# Patient Record
Sex: Female | Born: 1935
Health system: Southern US, Community
[De-identification: ages and names within clinical notes are randomized; demographics above are authoritative.]

## PROBLEM LIST (undated history)

## (undated) DIAGNOSIS — K5790 Diverticulosis of intestine, part unspecified, without perforation or abscess without bleeding: Secondary | ICD-10-CM

## (undated) DIAGNOSIS — C50919 Malignant neoplasm of unspecified site of unspecified female breast: Secondary | ICD-10-CM

## (undated) DIAGNOSIS — E039 Hypothyroidism, unspecified: Secondary | ICD-10-CM

## (undated) DIAGNOSIS — R0989 Other specified symptoms and signs involving the circulatory and respiratory systems: Secondary | ICD-10-CM

## (undated) DIAGNOSIS — E559 Vitamin D deficiency, unspecified: Secondary | ICD-10-CM

## (undated) DIAGNOSIS — R7303 Prediabetes: Secondary | ICD-10-CM

## (undated) DIAGNOSIS — I700 Atherosclerosis of aorta: Secondary | ICD-10-CM

## (undated) HISTORY — DX: Prediabetes: R73.03

## (undated) HISTORY — PX: BREAST SURGERY: SHX581

## (undated) HISTORY — DX: Atherosclerosis of aorta: I70.0

## (undated) HISTORY — DX: Vitamin D deficiency, unspecified: E55.9

## (undated) HISTORY — DX: Diverticulosis of intestine, part unspecified, without perforation or abscess without bleeding: K57.90

## (undated) HISTORY — DX: Other specified symptoms and signs involving the circulatory and respiratory systems: R09.89

## (undated) HISTORY — PX: COLONOSCOPY: SHX174

---

## 1998-11-01 ENCOUNTER — Other Ambulatory Visit: Admission: RE | Admit: 1998-11-01 | Discharge: 1998-11-01 | Payer: Self-pay | Admitting: Obstetrics and Gynecology

## 1999-10-05 ENCOUNTER — Encounter: Payer: Self-pay | Admitting: Surgery

## 1999-10-05 ENCOUNTER — Ambulatory Visit (HOSPITAL_COMMUNITY): Admission: RE | Admit: 1999-10-05 | Discharge: 1999-10-05 | Payer: Self-pay | Admitting: Internal Medicine

## 1999-10-16 ENCOUNTER — Encounter: Payer: Self-pay | Admitting: Surgery

## 1999-10-16 ENCOUNTER — Ambulatory Visit (HOSPITAL_COMMUNITY): Admission: RE | Admit: 1999-10-16 | Discharge: 1999-10-16 | Payer: Self-pay | Admitting: Surgery

## 2001-04-22 ENCOUNTER — Other Ambulatory Visit: Admission: RE | Admit: 2001-04-22 | Discharge: 2001-04-22 | Payer: Self-pay | Admitting: Obstetrics and Gynecology

## 2002-11-03 ENCOUNTER — Other Ambulatory Visit: Admission: RE | Admit: 2002-11-03 | Discharge: 2002-11-03 | Payer: Self-pay | Admitting: Obstetrics and Gynecology

## 2005-02-06 ENCOUNTER — Other Ambulatory Visit: Admission: RE | Admit: 2005-02-06 | Discharge: 2005-02-06 | Payer: Self-pay | Admitting: Internal Medicine

## 2010-07-06 ENCOUNTER — Other Ambulatory Visit: Payer: Self-pay | Admitting: Internal Medicine

## 2010-07-06 ENCOUNTER — Other Ambulatory Visit (HOSPITAL_COMMUNITY)
Admission: RE | Admit: 2010-07-06 | Discharge: 2010-07-06 | Disposition: A | Discharge status: Home / Self Care | Payer: Medicare Other | Source: Ambulatory Visit | Attending: Internal Medicine | Admitting: Internal Medicine

## 2010-07-06 DIAGNOSIS — Z124 Encounter for screening for malignant neoplasm of cervix: Secondary | ICD-10-CM | POA: Insufficient documentation

## 2010-07-06 DIAGNOSIS — Z1159 Encounter for screening for other viral diseases: Secondary | ICD-10-CM | POA: Insufficient documentation

## 2011-06-19 ENCOUNTER — Other Ambulatory Visit: Payer: Self-pay | Admitting: Radiology

## 2011-06-20 ENCOUNTER — Other Ambulatory Visit: Payer: Self-pay | Admitting: Radiology

## 2011-06-20 DIAGNOSIS — C50911 Malignant neoplasm of unspecified site of right female breast: Secondary | ICD-10-CM

## 2011-06-21 ENCOUNTER — Other Ambulatory Visit: Payer: Self-pay | Admitting: *Deleted

## 2011-06-21 ENCOUNTER — Telehealth: Payer: Self-pay | Admitting: *Deleted

## 2011-06-21 DIAGNOSIS — C50511 Malignant neoplasm of lower-outer quadrant of right female breast: Secondary | ICD-10-CM | POA: Insufficient documentation

## 2011-06-21 DIAGNOSIS — C50519 Malignant neoplasm of lower-outer quadrant of unspecified female breast: Secondary | ICD-10-CM

## 2011-06-21 NOTE — Telephone Encounter (Signed)
Confirmed [facility] for 06/27/11 at 0800.  Instructions and contact information given.

## 2011-06-25 ENCOUNTER — Ambulatory Visit
Admission: RE | Admit: 2011-06-25 | Discharge: 2011-06-25 | Disposition: A | Discharge status: Home / Self Care | Payer: Medicare Other | Source: Ambulatory Visit | Attending: Radiology | Admitting: Radiology

## 2011-06-25 DIAGNOSIS — C50911 Malignant neoplasm of unspecified site of right female breast: Secondary | ICD-10-CM

## 2011-06-25 MED ORDER — GADOBENATE DIMEGLUMINE 529 MG/ML IV SOLN
12.0000 mL | Freq: Once | INTRAVENOUS | Status: AC | PRN
  Administered 2011-06-25: 12 mL via INTRAVENOUS

## 2011-06-27 ENCOUNTER — Ambulatory Visit
Admission: RE | Admit: 2011-06-27 | Discharge: 2011-06-27 | Disposition: A | Discharge status: Home / Self Care | Payer: Medicare Other | Source: Ambulatory Visit | Attending: Radiation Oncology | Admitting: Radiation Oncology

## 2011-06-27 ENCOUNTER — Telehealth: Payer: Self-pay | Admitting: Oncology

## 2011-06-27 ENCOUNTER — Encounter: Payer: Self-pay | Admitting: Radiation Oncology

## 2011-06-27 ENCOUNTER — Ambulatory Visit (HOSPITAL_BASED_OUTPATIENT_CLINIC_OR_DEPARTMENT_OTHER): Payer: Medicare Other | Admitting: Oncology

## 2011-06-27 ENCOUNTER — Encounter (INDEPENDENT_AMBULATORY_CARE_PROVIDER_SITE_OTHER): Payer: Self-pay | Admitting: Surgery

## 2011-06-27 ENCOUNTER — Ambulatory Visit: Payer: Medicare Other | Attending: Surgery | Admitting: Physical Therapy

## 2011-06-27 ENCOUNTER — Ambulatory Visit: Payer: Medicare Other

## 2011-06-27 ENCOUNTER — Other Ambulatory Visit (HOSPITAL_BASED_OUTPATIENT_CLINIC_OR_DEPARTMENT_OTHER): Payer: Medicare Other | Admitting: Lab

## 2011-06-27 ENCOUNTER — Ambulatory Visit (HOSPITAL_BASED_OUTPATIENT_CLINIC_OR_DEPARTMENT_OTHER): Payer: Medicare Other | Admitting: Surgery

## 2011-06-27 ENCOUNTER — Encounter: Payer: Self-pay | Admitting: Oncology

## 2011-06-27 VITALS — BP 157/72 | HR 58 | Temp 97.7°F | Ht 60.0 in | Wt 138.7 lb

## 2011-06-27 VITALS — BP 120/80 | HR 80 | Temp 98.0°F | Resp 20

## 2011-06-27 DIAGNOSIS — C50519 Malignant neoplasm of lower-outer quadrant of unspecified female breast: Secondary | ICD-10-CM

## 2011-06-27 DIAGNOSIS — D0510 Intraductal carcinoma in situ of unspecified breast: Secondary | ICD-10-CM

## 2011-06-27 DIAGNOSIS — D0590 Unspecified type of carcinoma in situ of unspecified breast: Secondary | ICD-10-CM

## 2011-06-27 DIAGNOSIS — R293 Abnormal posture: Secondary | ICD-10-CM | POA: Insufficient documentation

## 2011-06-27 DIAGNOSIS — IMO0001 Reserved for inherently not codable concepts without codable children: Secondary | ICD-10-CM | POA: Insufficient documentation

## 2011-06-27 DIAGNOSIS — C50919 Malignant neoplasm of unspecified site of unspecified female breast: Secondary | ICD-10-CM | POA: Insufficient documentation

## 2011-06-27 LAB — CBC WITH DIFFERENTIAL/PLATELET
BASO%: 0.5 % (ref 0.0–2.0)
Basophils Absolute: 0 10*3/uL (ref 0.0–0.1)
EOS%: 1.6 % (ref 0.0–7.0)
Eosinophils Absolute: 0.1 10*3/uL (ref 0.0–0.5)
HCT: 38.9 % (ref 34.8–46.6)
HGB: 12.7 g/dL (ref 11.6–15.9)
LYMPH%: 35.9 % (ref 14.0–49.7)
MCH: 28.4 pg (ref 25.1–34.0)
MCHC: 32.6 g/dL (ref 31.5–36.0)
MCV: 87 fL (ref 79.5–101.0)
MONO#: 0.8 10*3/uL (ref 0.1–0.9)
MONO%: 16.3 % — ABNORMAL HIGH (ref 0.0–14.0)
NEUT#: 2.3 10*3/uL (ref 1.5–6.5)
NEUT%: 45.7 % (ref 38.4–76.8)
Platelets: 144 10*3/uL — ABNORMAL LOW (ref 145–400)
RBC: 4.47 10*6/uL (ref 3.70–5.45)
RDW: 14.4 % (ref 11.2–14.5)
WBC: 5 10*3/uL (ref 3.9–10.3)
lymph#: 1.8 10*3/uL (ref 0.9–3.3)

## 2011-06-27 LAB — COMPREHENSIVE METABOLIC PANEL
AST: 27 U/L (ref 0–37)
Albumin: 3.4 g/dL — ABNORMAL LOW (ref 3.5–5.2)
BUN: 15 mg/dL (ref 6–23)
CO2: 28 mEq/L (ref 19–32)
Calcium: 9.9 mg/dL (ref 8.4–10.5)
Chloride: 99 mEq/L (ref 96–112)
Glucose, Bld: 109 mg/dL — ABNORMAL HIGH (ref 70–99)
Potassium: 4.1 mEq/L (ref 3.5–5.3)

## 2011-06-27 NOTE — Progress Notes (Signed)
Robin Huynh [id number] [birth year] 77 y.o. 06/27/2011 12:30 PM  CC  MCKEOWN,WILLIAM DAVID, MD [address] [place] [address] Dr. Abigail Miyamoto Dr. 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 Patient was seen in the Multidisciplinary Breast Clinic for discussion of her treatment options.  STAGE:   Cancer of lower-outer quadrant of female breast   Primary site: Breast (Right)   Staging method: AJCC 7th Edition   Clinical: (Tis, N0, cM0)   Summary: (Tis, N0, cM0)  REFERRING PHYSICIAN: Dr. Abigail Miyamoto  HISTORY OF PRESENT ILLNESS:  Robin Huynh is a 77 y.o. female With medical history significant foro thyroidism and possibly osteopenia. Patient has been going for screening mammograms on a regular basis. About 6 months ago patient had a screening mammogram performed that showed some increasing calcifications. Because of this additional imaging of the right breast were requested. As there were noted to be waxing and waning subcentimeter masses consistent possibly with cysts in the right breast. On 06/14/2011 patient Had a unilateral diagnostic mammogram performed of the right breast. She was again noted to have calcifications in regional distribution in the lower outer quadrant of the right breast which had not significantly changed there were located in the same area same size on compression magnification views there were a few more calcifications on this mammogram in comparison to 6 months ago there were no associated masses or distortion. Patient was recommended either bilateral diagnostic mammograms and six-month or proceed with stereotactic guided core needle biopsy. Patient opted for a core needle biopsy. She had this performed on 06/19/2011. The pathology showed a ductal carcinoma in situ with associated microcalcifications  intermediate grade ER +100% PR +76%. Patient is now seen in the multidisciplinary breast clinic for discussion of treatment options. Patient on 06/25/2011 also had bilateral breast MRIs performed. The MRI showed small hematoma and clip artifact in the lower outer quadrant of the right breast from her recent core biopsy no other abnormal enhancement associated with the patient's known DCIS. There was an oval 4 mm in size enhancing mass located within the lower inner quadrant of the right breast that contained fatty cleft. In addition there were some stable benign intramammary lymph nodes. However because of this additional finding a 4 mm it was recommended at the multidisciplinary breast conference the patient undergo a second biopsy. Otherwise patient is without any other significant complaints.   Past Medical History: Past Medical History  Diagnosis Date  . Thyroid disease     Past Surgical History: Past Surgical History  Procedure Date  . Breast surgery     Family History: Family History  Problem Relation Age of Onset  . Cancer Mother   . Cancer Father   . Cancer Brother     Social History Patient is married she is accompanied by her husband today. She has 2 children age 75 and 80 patient is a retired [employer] loss adjuster. History  Substance Use Topics  . Smoking status: Never Smoker   . Smokeless tobacco: Not on file  . Alcohol Use: Not on file    Allergies: No Known Allergies  Current Medications: Current Outpatient Prescriptions  Medication Sig Dispense Refill  . aspirin 81 MG tablet Take 81 mg by mouth daily.      . calcium carbonate (TUMS - DOSED IN MG ELEMENTAL CALCIUM) 500 MG chewable tablet Chew 1 tablet by mouth daily.      Marland Kitchen  fish oil-omega-3 fatty acids 1000 MG capsule Take 2 g by mouth daily.      Marland Kitchen levothyroxine (SYNTHROID, LEVOTHROID) 100 MCG tablet Take 100 mcg by mouth daily.      . magnesium 30 MG tablet Take 30 mg by mouth 2 (two) times daily.      . raloxifene  (EVISTA) 60 MG tablet Take 60 mg by mouth daily.      . Red Yeast Rice 600 MG CAPS Take by mouth.        OB/GYN History: Menarche at age 18 patient is postmenopausal no hormone replacement therapy she had her first live birth at 26 she has completed all of her family.  Fertility Discussion: N/A Prior History of Cancer: N/A  Health Maintenance:  Colonoscopy Patient had a colonoscopy in 2008 Bone Density Last bone density was over 2 years ago Last PAP smear 2012  ECOG PERFORMANCE STATUS: 1 - Symptomatic but completely ambulatory  Genetic Counseling/testing: Patient's family history is reviewed and at this time genetic counseling and testing is not recommended.  REVIEW OF SYSTEMS:  Constitutional: positive for fatigue Ears, nose, mouth, throat, and face: negative Respiratory: negative Cardiovascular: negative Gastrointestinal: negative Genitourinary:negative Integument/breast: positive for breast tenderness Hematologic/lymphatic: negative Musculoskeletal:negative Neurological: negative Endocrine: negative Allergic/Immunologic: negative  PHYSICAL EXAMINATION: Blood pressure 157/72, pulse 58, temperature 97.7 F (36.5 C), height 5' (1.524 m), weight 138 lb 11.2 oz (62.914 kg).  ZOX:WRUEA, healthy and no distress SKIN: no rashes or significant lesions HEAD: Normocephalic EYES: PERRLA, EOMI, Conjunctiva are pink and non-injected, sclera clear EARS: External ears normal OROPHARYNX:no exudate, no erythema and dentition normal  NECK: supple, no adenopathy, no bruits, no JVD LYMPH:  no palpable lymphadenopathy, no hepatosplenomegaly BREAST:left breast normal without mass, skin or nipple changes or axillary nodes, Right breast shows an area of ecchymosis from her recent biopsy there is a slight hematoma palpable otherwise no nipple discharge or any other skin changes. LUNGS: clear to auscultation and percussion HEART: regular rate & rhythm, no murmurs and no  gallops ABDOMEN:abdomen soft, non-tender, normal bowel sounds and no masses or organomegaly BACK: No CVA tenderness EXTREMITIES:no edema, no clubbing, no cyanosis  NEURO: alert & oriented x 3 with fluent speech, no focal motor/sensory deficits, gait normal, reflexes normal and symmetric     STUDIES/RESULTS: Mr Breast Bilateral W Wo Contrast  06/26/2011  *RADIOLOGY REPORT*  Clinical Data: Recently diagnosed right breast DCIS (via stereotactic core biopsy of calcifications located within the lower outer quadrant of the right breast).  Presurgical evaluation.  BUN and creatinine were obtained on site at [facility] at [address] Results:  BUN 0.8 mg/dL,  Creatinine 13 mg/dL.  BILATERAL BREAST MRI WITH AND WITHOUT CONTRAST  Technique: Multiplanar, multisequence MR images of both breasts were obtained prior to and following the intravenous administration of 12ml of Multihance.  Three dimensional images were evaluated at the independent DynaCad workstation.  Comparison:  Previous studies from [facility] dating back to 11/03/2008.  Findings: There is post biopsy change with a small hematoma and a clip artifact located within the lower outer quadrant of the right breast related to the patient's recent stereotactic core biopsy. There is no abnormal enhancement associated with the patient's known DCIS.  There is an oval 4 mm in size enhancing mass located within the lower inner quadrant of the right breast which contains a fatty cleft on the axial T2 sequences.  In addition, this appears stable when compared to prior mammograms dating back to 11/03/2008  and is consistent with a stable benign intramammary lymph node. There are no areas of worrisome enhancement within either breast. There is no evidence for axillary or internal mammary adenopathy and there are no additional findings.  IMPRESSION: Mild post biopsy change within the lower outer quadrant of the right breast.  No areas of worrisome  enhancement within either breast and no evidence for adenopathy.  RECOMMENDATION: BI-RADS CATEGORY 6:  Known biopsy-proven malignancy - appropriate action should be taken.  THREE-DIMENSIONAL MR IMAGE RENDERING ON INDEPENDENT WORKSTATION:  Three-dimensional MR images were rendered by post-processing of the original MR data on an independent workstation.  The three- dimensional MR images were interpreted, and findings were reported in the accompanying complete MRI report for this study.  Original Report Authenticated By: Rolla Plate, M.D.     LABS:    Chemistry      Component Value Date/Time   NA 136 06/27/2011 0800   K 4.1 06/27/2011 0800   CL 99 06/27/2011 0800   CO2 28 06/27/2011 0800   BUN 15 06/27/2011 0800   CREATININE 0.86 06/27/2011 0800      Component Value Date/Time   CALCIUM 9.9 06/27/2011 0800   ALKPHOS 66 06/27/2011 0800   AST 27 06/27/2011 0800   ALT 8 06/27/2011 0800   BILITOT 0.2* 06/27/2011 0800      Lab Results  Component Value Date   WBC 5.0 06/27/2011   HGB 12.7 06/27/2011   HCT 38.9 06/27/2011   MCV 87.0 06/27/2011   PLT 144* 06/27/2011       PATHOLOGY: ADDITIONAL INFORMATION: PROGNOSTIC INDICATORS - ACIS Results IMMUNOHISTOCHEMICAL AND MORPHOMETRIC ANALYSIS BY THE AUTOMATED CELLULAR IMAGING SYSTEM (ACIS) Estrogen Receptor (Negative, <1%): 100%, POSITIVE,,MODERATE STAINING INTENSITY Progesterone Receptor (Negative, <1%): 76%,POSITIVE, MODERATE STAINING INTENSITY All controls stained appropriately Abigail Miyamoto MD Pathologist, Electronic Signature ( Signed 06/22/2011) FINAL DIAGNOSIS Diagnosis Breast, right, needle core biopsy - DUCTAL CARCINOMA IN SITU WITH ASSOCIATED MICROCALCIFICATION. - SEE COMMENT. Microscopic Comment Although definitive grading of ductal carcinoma in situ is best done on excision, the features of the ductal carcinoma in situ from the right needle core biopsy are consistent with intermediate grade ductal carcinoma in situ. An ER  and PR will be performed and reported in an addendum. The findings are called to the [employer] on 06-20-11. Dr. Frederica Kuster has seen this case in consultation with agreement. (RH:mw 06-20-11) Zandra Abts MD Pathologist, Electronic Signature (Case signed 06/20/2011) 1 of 2 FINAL for MARKESHA, HANNIG [phone]) Specimen Gross and Clinical Information Specimen Comment There were calcifications in spec rad In formalin 2:30, extracted 2:15 Call MB at [phone] Specimen(s) Obtained: Breast, right, needle core biopsy Specimen Clinical Information Microcalficiation Gross   ASSESSMENT    77 year old female with  #1 new diagnosis of intermediate grade ductal carcinoma in situ with associated microcalcifications of the right breast diagnosed 06/19/2011. The DCIS is estrogen receptor +100% progesterone receptor +76%.  #2 MRI of the breasts revealed a 4 mm area of concern and a needle core biopsy is recommended of this. Patient's case was discussed at the multidisciplinary breast conference. All of her studies and pathology were reviewed.  Clinical Trial Eligibility:If patient undergoes a lumpectomy then she would be eligible for NSABP B. 43 study Multidisciplinary conference discussion Recommendation for additional biopsies of the right breast were recommended with possible breast conservation versus a mastectomy    PLAN:    #1 patient will proceed with biopsy of the 4 mm area in the right breast. Patient  and I discussed her surgical options she understands that she could get breast conservation if this additional area is negative for a malignancy. However if it is positive then she may need a mastectomy with sentinel lymph node biopsy.  #2 adjuvantly patient may be eligible for antiestrogen chemoprevention especially if she has a lumpectomy. If patient has a mastectomy and then certainly we could do chemoprevention for the contralateral breast. The drug of choice would be tamoxifen  given for a total of 5 years.  #3 patient has been on this to for quite some time and I have recommended that she discontinue this. Patient will need bone density performed eventually prior to going on any kind of antiestrogen therapy to evaluate her bone health.       Discussion: Patient is being treated per NCCN breast cancer care guidelines appropriate for stage.Patient is being treated for DCIS following guidelines set by NCCN and lumpectomy followed by radiation followed by antiestrogen therapy as chemoprevention.   Thank you so much for allowing me to participate in the care of Robin Huynh. I will continue to follow up the patient with you and assist in her care.  All questions were answered. The patient knows to call the clinic with any problems, questions or concerns. We can certainly see the patient much sooner if necessary.  I spent 60 minutes counseling the patient face to face. The total time spent in the appointment was 60 minutes.  Drue Second, MD Medical/Oncology [facility] [phone] (beeper) [phone] (Office)  06/27/2011, 12:31 PM

## 2011-06-27 NOTE — Progress Notes (Signed)
Radiation Oncology         [phone] ________________________________  Initial outpatient Consultation  Name: Robin Huynh MRN: [id number]  Date: 06/27/2011  DOB: 06-25-1934  WU:[id number],NWGNFAO DAVID, MD  Shelly Rubenstein, MD   REFERRING PHYSICIAN: Shelly Rubenstein, MD  DIAGNOSIS: The encounter diagnosis was Cancer of lower-outer quadrant of female breast.  HISTORY OF PRESENT ILLNESS::Robin Huynh is a 77 y.o. female who is seen as part of the multidisciplinary breast clinic. Earlier this year on short interval mammography the patient was noted to have some increased calcifications in the lower outer aspect of the right breast. Patient wished to have biopsy of this area which was performed on 06/19/2011. Biopsy revealed ductal carcinoma in situ with associated microcalcification. The lesion biopsy was felt to be intermediate grade. Tumor was estrogen receptor positive at 100% and progesterone receptor positive at 76%. With these findings patient proceeded to undergo MRI which showed post biopsy change with small hematoma in the lower outer quadrant of the right breast. In addition there was a 4 mm oval enhancing mass within the lower inner quadrant of the right breast. Patient's the x-rays as well as her pathology was reviewed earlier today at the multidisciplinary breast conference. It was recommended the patient proceed with the evaluation of the lesion within the lower inner quadrant of the right breast if she should desire breast conserving therapy.  PREVIOUS RADIATION THERAPY: No  PAST MEDICAL HISTORY:  has a past medical history of Thyroid disease.    PAST SURGICAL HISTORY: Past Surgical History  Procedure Date  . Breast surgery     FAMILY HISTORY: family history includes Cancer in her brother, father, and mother. breast and colon cancer in her mother. Prostate cancer in patient's father. lung cancer in patient's brother and kidney cancer in another  patient's  brother.  SOCIAL HISTORY:  reports that she has never smoked. She does not have any smokeless tobacco history on file. no history of ethanol use.  ALLERGIES: Review of patient's allergies indicates no known allergies.  MEDICATIONS:  Current Outpatient Prescriptions  Medication Sig Dispense Refill  . aspirin 81 MG tablet Take 81 mg by mouth daily.      . calcium carbonate (TUMS - DOSED IN MG ELEMENTAL CALCIUM) 500 MG chewable tablet Chew 1 tablet by mouth daily.      . fish oil-omega-3 fatty acids 1000 MG capsule Take 2 g by mouth daily.      Marland Kitchen levothyroxine (SYNTHROID, LEVOTHROID) 100 MCG tablet Take 100 mcg by mouth daily.      . magnesium 30 MG tablet Take 30 mg by mouth 2 (two) times daily.      . raloxifene (EVISTA) 60 MG tablet Take 60 mg by mouth daily.      . Red Yeast Rice 600 MG CAPS Take by mouth.        REVIEW OF SYSTEMS:  A 15 point review of systems is documented in the electronic medical record. This was obtained by the nursing staff. However, I reviewed this with the patient to discuss relevant findings and make appropriate changes. She denies any pain within either breast nipple discharge or bleeding.   PHYSICAL EXAM:    LABORATORY DATA:  Lab Results  Component Value Date   WBC 5.0 06/27/2011   HGB 12.7 06/27/2011   HCT 38.9 06/27/2011   MCV 87.0 06/27/2011   PLT 144* 06/27/2011   Lab Results  Component Value Date   NA 136 06/27/2011   K 4.1  06/27/2011   CL 99 06/27/2011   CO2 28 06/27/2011   Lab Results  Component Value Date   ALT 8 06/27/2011   AST 27 06/27/2011   ALKPHOS 66 06/27/2011   BILITOT 0.2* 06/27/2011     RADIOGRAPHY: Mr Breast Bilateral W Wo Contrast  06/26/2011  *RADIOLOGY REPORT*  Clinical Data: Recently diagnosed right breast DCIS (via stereotactic core biopsy of calcifications located within the lower outer quadrant of the right breast).  Presurgical evaluation.  BUN and creatinine were obtained on site at [facility] at [address]  Results:  BUN 0.8 mg/dL,  Creatinine 13 mg/dL.  BILATERAL BREAST MRI WITH AND WITHOUT CONTRAST  Technique: Multiplanar, multisequence MR images of both breasts were obtained prior to and following the intravenous administration of 12ml of Multihance.  Three dimensional images were evaluated at the independent DynaCad workstation.  Comparison:  Previous studies from [facility] dating back to 11/03/2008.  Findings: There is post biopsy change with a small hematoma and a clip artifact located within the lower outer quadrant of the right breast related to the patient's recent stereotactic core biopsy. There is no abnormal enhancement associated with the patient's known DCIS.  There is an oval 4 mm in size enhancing mass located within the lower inner quadrant of the right breast which contains a fatty cleft on the axial T2 sequences.  In addition, this appears stable when compared to prior mammograms dating back to 11/03/2008 and is consistent with a stable benign intramammary lymph node. There are no areas of worrisome enhancement within either breast. There is no evidence for axillary or internal mammary adenopathy and there are no additional findings.  IMPRESSION: Mild post biopsy change within the lower outer quadrant of the right breast.  No areas of worrisome enhancement within either breast and no evidence for adenopathy.  RECOMMENDATION: BI-RADS CATEGORY 6:  Known biopsy-proven malignancy - appropriate action should be taken.  THREE-DIMENSIONAL MR IMAGE RENDERING ON INDEPENDENT WORKSTATION:  Three-dimensional MR images were rendered by post-processing of the original MR data on an independent workstation.  The three- dimensional MR images were interpreted, and findings were reported in the accompanying complete MRI report for this study.  Original Report Authenticated By: Rolla Plate, M.D.      IMPRESSION: Intermediate grade ductal carcinoma in situ of the right breast. As above the patient may have  an additional lesion in the lower inner quadrant of the right breast. This would require biopsy prior to considering breast conserving therapy. I discussed the options for management including mastectomy and is conserving therapy with Ms. Allena Napoleon. Patient does wish to be considered for breast conserving therapy if technically possible. This patient will undergo biopsy of the lesion in the lower inner quadrant of the right breast tomorrow.  PLAN: Biopsy as above. Treatment plan and management will depend on the patient's biopsy findings.     ------------------------------------------------   Billie Lade, PhD, MD

## 2011-06-27 NOTE — Progress Notes (Signed)
Patient ID: Robin Huynh, female   DOB: 02-09-1934, 77 y.o.   MRN: [id number]  Chief Complaint  Patient presents with  . Other    right breast DCIS    HPI Robin Huynh is a 77 y.o. female.  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 She is referred by Dr. Lynford Humphrey. She has had no previous problems with the breast. She denies nipple discharge. She is otherwise without complaints. HPI  Past Medical History  Diagnosis Date  . Thyroid disease     Past Surgical History  Procedure Date  . Breast surgery     Family History  Problem Relation Age of Onset  . Cancer Mother   . Cancer Father   . Cancer Brother     Social History History  Substance Use Topics  . Smoking status: Never Smoker   . Smokeless tobacco: Not on file  . Alcohol Use: Not on file    No Known Allergies  Current Outpatient Prescriptions  Medication Sig Dispense Refill  . aspirin 81 MG tablet Take 81 mg by mouth daily.      . calcium carbonate (TUMS - DOSED IN MG ELEMENTAL CALCIUM) 500 MG chewable tablet Chew 1 tablet by mouth daily.      . fish oil-omega-3 fatty acids 1000 MG capsule Take 2 g by mouth daily.      Marland Kitchen levothyroxine (SYNTHROID, LEVOTHROID) 100 MCG tablet Take 100 mcg by mouth daily.      . magnesium 30 MG tablet Take 30 mg by mouth 2 (two) times daily.      . raloxifene (EVISTA) 60 MG tablet Take 60 mg by mouth daily.      . Red Yeast Rice 600 MG CAPS Take by mouth.        Review of Systems Review of Systems  Constitutional: Negative for fever, chills and unexpected weight change.  HENT: Negative for hearing loss, congestion, sore throat, trouble swallowing and voice change.   Eyes: Negative for visual disturbance.  Respiratory: Negative for cough and wheezing.   Cardiovascular: Negative for chest pain, palpitations and leg swelling.  Gastrointestinal: Negative for nausea, vomiting, abdominal pain, diarrhea, constipation, blood in stool, abdominal distention  and anal bleeding.  Genitourinary: Negative for hematuria, vaginal bleeding and difficulty urinating.  Musculoskeletal: Negative for arthralgias.  Skin: Negative for rash and wound.  Neurological: Negative for seizures, syncope and headaches.  Hematological: Negative for adenopathy. Does not bruise/bleed easily.  Psychiatric/Behavioral: Negative for confusion.    Blood pressure 120/80, pulse 80, temperature 98 F (36.7 C), resp. rate 20.  Physical Exam Physical Exam  Constitutional: She is oriented to person, place, and time. She appears well-developed and well-nourished. No distress.  HENT:  Head: Normocephalic and atraumatic.  Right Ear: External ear normal.  Left Ear: External ear normal.  Nose: Nose normal.  Mouth/Throat: Oropharynx is clear and moist. No oropharyngeal exudate.  Eyes: Conjunctivae are normal. Pupils are equal, round, and reactive to light. Right eye exhibits no discharge. Left eye exhibits no discharge. No scleral icterus.  Neck: Normal range of motion. Neck supple. No tracheal deviation present. No thyromegaly present.  Cardiovascular: Normal rate, regular rhythm, normal heart sounds and intact distal pulses.   No murmur heard. Pulmonary/Chest: Effort normal. No respiratory distress. She has no wheezes. She has no rales.  Abdominal: Soft. Bowel sounds are normal. She exhibits no distension. There is no tenderness.  Musculoskeletal: Normal range of motion. She exhibits no edema and  no tenderness.  Lymphadenopathy:    She has no cervical adenopathy.  Neurological: She is alert and oriented to person, place, and time.  Skin: Skin is warm and dry. No rash noted. She is not diaphoretic. No erythema.  Psychiatric: Her behavior is normal. Judgment normal.  breasts: There are no palpable masses in either breast. There is no axillary or supraclavicular adenopathy on either side.  Data Reviewed I have reviewed the mammograms, MRI, and biopsy results. The biopsy  confirms ductal carcinoma in situ. It is ER and PR positive. There is an extensive amount of calcifications on the mammograms of the right breast  Assessment    Ductal carcinoma in situ of the right breast with extensive microcalcifications throughout    Plan    We discussed her at length at our tumor board conference. We discussed conservative measures with lumpectomy versus simple mastectomy given the extensive calcifications. It is recommended that the most superficial calcifications undergo stereotactic biopsy preoperatively. If the biopsy is positive, she would then need a mastectomy. If the biopsy is negative, and we could then attempt conservation with a large lumpectomy with needle localization and bracketing. After a long discussion with the patient and her husband she wishes to proceed with stereotactic biopsy of the most superficial calcifications in attempt to proceed with breast conservation. This will best be scheduled. We will then make plans are for the biopsy results are known regarding further surgery.       Florella Mcneese A 06/27/2011, 9:32 AM

## 2011-06-27 NOTE — Telephone Encounter (Signed)
lmonvm adviisng the pt of her July appts

## 2011-06-27 NOTE — Patient Instructions (Addendum)
Follow up in 5-6 weeks.

## 2011-06-28 ENCOUNTER — Other Ambulatory Visit: Payer: Self-pay | Admitting: Radiology

## 2011-06-29 ENCOUNTER — Telehealth (INDEPENDENT_AMBULATORY_CARE_PROVIDER_SITE_OTHER): Payer: Self-pay

## 2011-06-29 NOTE — Telephone Encounter (Signed)
Consult appointment given to Robin Huynh for 07/11/11 @ 4:40pm, patient to arrive at 4:00pm.

## 2011-07-02 ENCOUNTER — Telehealth: Payer: Self-pay | Admitting: *Deleted

## 2011-07-02 NOTE — Telephone Encounter (Signed)
Spoke to pt concerning [facility] from 6/19.  Pt denies questions or concerns regarding dx or treatment care plan.  Confirmed f/u appt with Dr. Magnus Ivan on 7/3 to discuss further surgery options.  Encourage pt to call with further needs.  Contact information given.

## 2011-07-04 ENCOUNTER — Encounter: Payer: Self-pay | Admitting: *Deleted

## 2011-07-04 NOTE — Progress Notes (Signed)
Mailed after appt letter to pt. 

## 2011-07-09 ENCOUNTER — Encounter (INDEPENDENT_AMBULATORY_CARE_PROVIDER_SITE_OTHER): Payer: Self-pay | Admitting: Surgery

## 2011-07-10 ENCOUNTER — Encounter: Payer: Self-pay | Admitting: *Deleted

## 2011-07-10 NOTE — Progress Notes (Signed)
[facility] Psychosocial Distress Screening Clinical Social Work  Patient completed distress screening protocol.  The patient scored a 5 on the Psychosocial Distress Thermometer which indicates moderate distress. Patient and Family support team member met with pt in [facility] to assess for distress and other psychosocial needs. Appropriate information and interventions were provided.      Tamala Julian, MSW, LCSW Clinical Social Worker [facility] [phone]

## 2011-07-11 ENCOUNTER — Encounter (INDEPENDENT_AMBULATORY_CARE_PROVIDER_SITE_OTHER): Payer: Self-pay | Admitting: Surgery

## 2011-07-11 ENCOUNTER — Ambulatory Visit (INDEPENDENT_AMBULATORY_CARE_PROVIDER_SITE_OTHER): Payer: Medicare Other | Admitting: Surgery

## 2011-07-11 VITALS — BP 136/71 | HR 59 | Temp 96.8°F | Ht 61.0 in | Wt 137.8 lb

## 2011-07-11 DIAGNOSIS — C50911 Malignant neoplasm of unspecified site of right female breast: Secondary | ICD-10-CM

## 2011-07-11 DIAGNOSIS — C50919 Malignant neoplasm of unspecified site of unspecified female breast: Secondary | ICD-10-CM

## 2011-07-11 DIAGNOSIS — Z01818 Encounter for other preprocedural examination: Secondary | ICD-10-CM

## 2011-07-11 NOTE — Progress Notes (Signed)
Subjective:     Patient ID: Robin Huynh, female   DOB: 12-May-1934, 77 y.o.   MRN: [id number]  HPI They are here today to discuss the results of her most recent biopsy of the right breast. She has multifocal ductal carcinoma in situ. We are discussing surgical options.  Review of Systems     Objective:   Physical Exam     Assessment:     Multifocal right breast ductal carcinoma in situ    Plan:     After discussion and tumor board, right mastectomy with sentinel lymph node biopsy has been recommended. I had a long discussion with the patient her husband regarding this on the phone and again today. They are now in agreement to proceed with this. I discussed reconstruction but she declined. I also discussed sentinel lymph node biopsy in detail. I discussed the risks for surgery which includes but is not limited to bleeding, infection, skin necrosis, injury to surrounding structures, need for further surgery, etc. She understands and wishes to proceed. Surgery will be scheduled. Likelihood of success is good

## 2011-07-16 ENCOUNTER — Telehealth: Payer: Self-pay | Admitting: *Deleted

## 2011-07-16 ENCOUNTER — Encounter: Payer: Self-pay | Admitting: *Deleted

## 2011-07-16 NOTE — Telephone Encounter (Signed)
per orders from 07-16-2011 called patient left voice message to inform the patient of the new date and time

## 2011-07-26 ENCOUNTER — Encounter (HOSPITAL_COMMUNITY): Payer: Self-pay | Admitting: Pharmacy Technician

## 2011-08-02 ENCOUNTER — Ambulatory Visit: Payer: Medicare Other | Admitting: Oncology

## 2011-08-02 NOTE — Pre-Procedure Instructions (Signed)
20 Robin Huynh  08/02/2011   Your procedure is scheduled on:  Friday August 10, 2011.  Report to [facility] at 1000 AM.  Call this number if you have problems the morning of surgery: [phone]   Remember:   Do not eat food or drink:After Midnight.    Take these medicines the morning of surgery with A SIP OF WATER: Levothryoxine (Synthroid)   Do not wear jewelry, make-up or nail polish.  Do not wear lotions, powders, or perfumes.   Do not shave 48 hours prior to surgery.   Do not bring valuables to the hospital.  Contacts, dentures or bridgework may not be worn into surgery.  Leave suitcase in the car. After surgery it may be brought to your room.  For patients admitted to the hospital, checkout time is 11:00 AM the day of discharge.   Patients discharged the day of surgery will not be allowed to drive home.  Name and phone number of your driver:   Special Instructions: CH[facility] Shower Use Special Wash: 1/2 bottle night before surgery and 1/2 bottle morning of surgery.   Please read over the following fact sheets that you were given: Pain Booklet, Coughing and Deep Breathing, MRSA Information and Surgical Site Infection Prevention

## 2011-08-03 ENCOUNTER — Encounter (HOSPITAL_COMMUNITY): Payer: Self-pay

## 2011-08-03 ENCOUNTER — Encounter (HOSPITAL_COMMUNITY)
Admission: RE | Admit: 2011-08-03 | Discharge: 2011-08-03 | Disposition: A | Discharge status: Home / Self Care | Payer: Medicare Other | Source: Ambulatory Visit | Attending: Surgery | Admitting: Surgery

## 2011-08-03 HISTORY — DX: Hypothyroidism, unspecified: E03.9

## 2011-08-03 LAB — BASIC METABOLIC PANEL
Chloride: 100 mEq/L (ref 96–112)
GFR calc Af Amer: >90 mL/min (ref >90)
GFR calc non Af Amer: 80 mL/min — ABNORMAL LOW (ref >90)
Potassium: 4.1 mEq/L (ref 3.5–5.1)
Sodium: 136 mEq/L (ref 135–145)

## 2011-08-03 LAB — SURGICAL PCR SCREEN
MRSA, PCR: NEGATIVE
Staphylococcus aureus: NEGATIVE

## 2011-08-03 LAB — CBC
HCT: 38 % (ref 36.0–46.0)
Hemoglobin: 12.9 g/dL (ref 12.0–15.0)
RBC: 4.52 MIL/uL (ref 3.87–5.11)
WBC: 6.4 10*3/uL (ref 4.0–10.5)

## 2011-08-03 NOTE — Progress Notes (Signed)
Patient denied having a stress test, cardiac cath, or sleep study. Upon reviewing DOS instructions patient informed Nurse that Dr. Eliberto Ivory office instructed her to arrive at short stay at 1015 am. Nurse explained to patient that Dr. Eliberto Ivory patients normally arrive 3 hours before surgery and she should arrive at 0900. Nurse called Dr. Eliberto Ivory office for clarification and a voicemail was left requesting that office call patient with time of arrival. Will place chart in the follow up cabinet and follow up on Monday.

## 2011-08-03 NOTE — Progress Notes (Signed)
1650.Marland KitchenMarland KitchenMarland KitchenMarland KitchenFriday....  Spoke with Debbie from [facility].Marland KitchenMarland KitchenMarland KitchenThey have told the patient to be here 2 hrs (instead of the memo and his  Request to come 3 hrs early).Marland KitchenMarland KitchenDA

## 2011-08-09 DIAGNOSIS — C50919 Malignant neoplasm of unspecified site of unspecified female breast: Secondary | ICD-10-CM

## 2011-08-09 HISTORY — DX: Malignant neoplasm of unspecified site of unspecified female breast: C50.919

## 2011-08-09 MED ORDER — CEFAZOLIN SODIUM-DEXTROSE 2-3 GM-% IV SOLR
2.0000 g | INTRAVENOUS | Status: AC
  Administered 2011-08-10: 2 g via INTRAVENOUS
  Filled 2011-08-09: qty 50

## 2011-08-09 NOTE — H&P (Signed)
Shelly Rubenstein, MD 06/27/2011 9:39 AM Signed  Patient ID: Robin Huynh, female DOB: 28-Sep-1934, 77 y.o. MRN: [id number]  Chief Complaint   Patient presents with   .  Other     right breast DCIS    HPI  Robin Huynh is a 77 y.o. female. 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 She is referred by Dr. Lynford Humphrey. She has had no previous problems with the breast. She denies nipple discharge. She is otherwise without complaints.  HPI  Past Medical History   Diagnosis  Date   .  Thyroid disease     Past Surgical History   Procedure  Date   .  Breast surgery     Family History   Problem  Relation  Age of Onset   .  Cancer  Mother    .  Cancer  Father    .  Cancer  Brother     Social History  History   Substance Use Topics   .  Smoking status:  Never Smoker   .  Smokeless tobacco:  Not on file   .  Alcohol Use:  Not on file    No Known Allergies  Current Outpatient Prescriptions   Medication  Sig  Dispense  Refill   .  aspirin 81 MG tablet  Take 81 mg by mouth daily.     .  calcium carbonate (TUMS - DOSED IN MG ELEMENTAL CALCIUM) 500 MG chewable tablet  Chew 1 tablet by mouth daily.     .  fish oil-omega-3 fatty acids 1000 MG capsule  Take 2 g by mouth daily.     Marland Kitchen  levothyroxine (SYNTHROID, LEVOTHROID) 100 MCG tablet  Take 100 mcg by mouth daily.     .  magnesium 30 MG tablet  Take 30 mg by mouth 2 (two) times daily.     .  raloxifene (EVISTA) 60 MG tablet  Take 60 mg by mouth daily.     .  Red Yeast Rice 600 MG CAPS  Take by mouth.      Review of Systems  Review of Systems  Constitutional: Negative for fever, chills and unexpected weight change.  HENT: Negative for hearing loss, congestion, sore throat, trouble swallowing and voice change.  Eyes: Negative for visual disturbance.  Respiratory: Negative for cough and wheezing.  Cardiovascular: Negative for chest pain, palpitations and leg swelling.  Gastrointestinal: Negative for nausea,  vomiting, abdominal pain, diarrhea, constipation, blood in stool, abdominal distention and anal bleeding.  Genitourinary: Negative for hematuria, vaginal bleeding and difficulty urinating.  Musculoskeletal: Negative for arthralgias.  Skin: Negative for rash and wound.  Neurological: Negative for seizures, syncope and headaches.  Hematological: Negative for adenopathy. Does not bruise/bleed easily.  Psychiatric/Behavioral: Negative for confusion.   Blood pressure 120/80, pulse 80, temperature 98 F (36.7 C), resp. rate 20.  Physical Exam  Physical Exam  Constitutional: She is oriented to person, place, and time. She appears well-developed and well-nourished. No distress.  HENT:  Head: Normocephalic and atraumatic.  Right Ear: External ear normal.  Left Ear: External ear normal.  Nose: Nose normal.  Mouth/Throat: Oropharynx is clear and moist. No oropharyngeal exudate.  Eyes: Conjunctivae are normal. Pupils are equal, round, and reactive to light. Right eye exhibits no discharge. Left eye exhibits no discharge. No scleral icterus.  Neck: Normal range of motion. Neck supple. No tracheal deviation present. No thyromegaly present.  Cardiovascular: Normal rate, regular rhythm, normal heart sounds  and intact distal pulses.  No murmur heard.  Pulmonary/Chest: Effort normal. No respiratory distress. She has no wheezes. She has no rales.  Abdominal: Soft. Bowel sounds are normal. She exhibits no distension. There is no tenderness.  Musculoskeletal: Normal range of motion. She exhibits no edema and no tenderness.  Lymphadenopathy:  She has no cervical adenopathy.  Neurological: She is alert and oriented to person, place, and time.  Skin: Skin is warm and dry. No rash noted. She is not diaphoretic. No erythema.  Psychiatric: Her behavior is normal. Judgment normal.  breasts: There are no palpable masses in either breast. There is no axillary or supraclavicular adenopathy on either side.  Data  Reviewed  I have reviewed the mammograms, MRI, and biopsy results. The biopsy confirms ductal carcinoma in situ. It is ER and PR positive. There is an extensive amount of calcifications on the mammograms of the right breast  Assessment   Ductal carcinoma in situ of the right breast with extensive microcalcifications throughout   Plan   We discussed her at length at our tumor board conference. We discussed conservative measures with lumpectomy versus simple mastectomy given the extensive calcifications. It is recommended that the most superficial calcifications undergo stereotactic biopsy preoperatively. If the biopsy is positive, she would then need a mastectomy. If the biopsy is negative, and we could then attempt conservation with a large lumpectomy with needle localization and bracketing. After a long discussion with the patient and her husband she wishes to proceed with stereotactic biopsy of the most superficial calcifications in attempt to proceed with breast conservation. This will best be scheduled. We will then make plans are for the biopsy results are known regarding further surgery.   Addendum:   They are here today to discuss the results of her most recent biopsy of the right breast. She has multifocal ductal carcinoma in situ. We are discussing surgical options.   Impression:  Multifocal right breast DCIS  Plan:    After discussion and tumor board, right mastectomy with sentinel lymph node biopsy has been recommended. I had a long discussion with the patient her husband regarding this on the phone and again today. They are now in agreement to proceed with this. I discussed reconstruction but she declined. I also discussed sentinel lymph node biopsy in detail. I discussed the risks for surgery which includes but is not limited to bleeding, infection, skin necrosis, injury to surrounding structures, need for further surgery, etc. She understands and wishes to proceed. Surgery will be  scheduled. Likelihood of success is good    Danila Eddie A

## 2011-08-10 ENCOUNTER — Encounter (HOSPITAL_COMMUNITY): Payer: Self-pay | Admitting: General Practice

## 2011-08-10 ENCOUNTER — Encounter (HOSPITAL_COMMUNITY): Payer: Self-pay | Admitting: *Deleted

## 2011-08-10 ENCOUNTER — Encounter (HOSPITAL_COMMUNITY): Payer: Self-pay | Admitting: Anesthesiology

## 2011-08-10 ENCOUNTER — Encounter (HOSPITAL_COMMUNITY)
Admission: RE | Disposition: A | Discharge status: Home / Self Care | Payer: Self-pay | Source: Ambulatory Visit | Attending: Surgery

## 2011-08-10 ENCOUNTER — Ambulatory Visit (HOSPITAL_COMMUNITY): Payer: Medicare Other | Admitting: Anesthesiology

## 2011-08-10 ENCOUNTER — Ambulatory Visit (HOSPITAL_COMMUNITY)
Admission: RE | Admit: 2011-08-10 | Discharge: 2011-08-11 | Disposition: A | Discharge status: Home / Self Care | Payer: Medicare Other | Source: Ambulatory Visit | Attending: Surgery | Admitting: Surgery

## 2011-08-10 ENCOUNTER — Ambulatory Visit (HOSPITAL_COMMUNITY)
Admission: RE | Admit: 2011-08-10 | Discharge: 2011-08-10 | Disposition: A | Discharge status: Home / Self Care | Payer: Medicare Other | Source: Ambulatory Visit | Attending: Surgery | Admitting: Surgery

## 2011-08-10 DIAGNOSIS — D0590 Unspecified type of carcinoma in situ of unspecified breast: Secondary | ICD-10-CM | POA: Insufficient documentation

## 2011-08-10 DIAGNOSIS — M1990 Unspecified osteoarthritis, unspecified site: Secondary | ICD-10-CM | POA: Insufficient documentation

## 2011-08-10 DIAGNOSIS — Z01812 Encounter for preprocedural laboratory examination: Secondary | ICD-10-CM | POA: Insufficient documentation

## 2011-08-10 DIAGNOSIS — I10 Essential (primary) hypertension: Secondary | ICD-10-CM | POA: Insufficient documentation

## 2011-08-10 DIAGNOSIS — C50911 Malignant neoplasm of unspecified site of right female breast: Secondary | ICD-10-CM

## 2011-08-10 DIAGNOSIS — E039 Hypothyroidism, unspecified: Secondary | ICD-10-CM | POA: Insufficient documentation

## 2011-08-10 DIAGNOSIS — C50519 Malignant neoplasm of lower-outer quadrant of unspecified female breast: Secondary | ICD-10-CM

## 2011-08-10 DIAGNOSIS — M129 Arthropathy, unspecified: Secondary | ICD-10-CM | POA: Insufficient documentation

## 2011-08-10 DIAGNOSIS — C50919 Malignant neoplasm of unspecified site of unspecified female breast: Secondary | ICD-10-CM

## 2011-08-10 HISTORY — PX: MASTECTOMY COMPLETE / SIMPLE W/ SENTINEL NODE BIOPSY: SUR846

## 2011-08-10 HISTORY — PX: MASTECTOMY W/ SENTINEL NODE BIOPSY: SHX2001

## 2011-08-10 HISTORY — DX: Malignant neoplasm of unspecified site of unspecified female breast: C50.919

## 2011-08-10 SURGERY — MASTECTOMY WITH SENTINEL LYMPH NODE BIOPSY
Anesthesia: General | Site: Chest | Laterality: Right | Wound class: Clean

## 2011-08-10 MED ORDER — ACETAMINOPHEN 10 MG/ML IV SOLN
INTRAVENOUS | Status: AC
  Filled 2011-08-10: qty 100

## 2011-08-10 MED ORDER — PROPOFOL 10 MG/ML IV EMUL
INTRAVENOUS | Status: DC | PRN
  Administered 2011-08-10: 160 mg via INTRAVENOUS

## 2011-08-10 MED ORDER — LEVOTHYROXINE SODIUM 50 MCG PO TABS: 50.0000 ug | ORAL_TABLET | Freq: Every day | ORAL | Status: DC

## 2011-08-10 MED ORDER — ONDANSETRON HCL 4 MG/2ML IJ SOLN: 4.0000 mg | Freq: Four times a day (QID) | INTRAMUSCULAR | Status: DC | PRN

## 2011-08-10 MED ORDER — ENOXAPARIN SODIUM 40 MG/0.4ML ~~LOC~~ SOLN
40.0000 mg | SUBCUTANEOUS | Status: DC
  Filled 2011-08-10: qty 0.4

## 2011-08-10 MED ORDER — 0.9 % SODIUM CHLORIDE (POUR BTL) OPTIME
TOPICAL | Status: DC | PRN
  Administered 2011-08-10: 1000 mL

## 2011-08-10 MED ORDER — LEVOTHYROXINE SODIUM 100 MCG PO TABS: 100.0000 ug | ORAL_TABLET | ORAL | Status: DC

## 2011-08-10 MED ORDER — LACTATED RINGERS IV SOLN: INTRAVENOUS | Status: DC

## 2011-08-10 MED ORDER — LIDOCAINE HCL (CARDIAC) 20 MG/ML IV SOLN
INTRAVENOUS | Status: DC | PRN
  Administered 2011-08-10: 50 mg via INTRAVENOUS

## 2011-08-10 MED ORDER — ONDANSETRON HCL 4 MG/2ML IJ SOLN
INTRAMUSCULAR | Status: DC | PRN
  Administered 2011-08-10: 4 mg via INTRAVENOUS

## 2011-08-10 MED ORDER — FENTANYL CITRATE 0.05 MG/ML IJ SOLN
INTRAMUSCULAR | Status: AC
  Filled 2011-08-10: qty 2

## 2011-08-10 MED ORDER — SODIUM CHLORIDE 0.9 % IJ SOLN
INTRAMUSCULAR | Status: DC | PRN
  Administered 2011-08-10: 14:00:00 via INTRAMUSCULAR

## 2011-08-10 MED ORDER — HYDROMORPHONE HCL PF 1 MG/ML IJ SOLN: 0.2500 mg | INTRAMUSCULAR | Status: DC | PRN

## 2011-08-10 MED ORDER — MIDAZOLAM HCL 2 MG/2ML IJ SOLN
1.0000 mg | INTRAMUSCULAR | Status: DC | PRN
  Administered 2011-08-10: 1 mg via INTRAVENOUS

## 2011-08-10 MED ORDER — LEVOTHYROXINE SODIUM 50 MCG PO TABS
50.0000 ug | ORAL_TABLET | ORAL | Status: DC
  Administered 2011-08-11: 50 ug via ORAL
  Filled 2011-08-10: qty 1

## 2011-08-10 MED ORDER — GLYCOPYRROLATE 0.2 MG/ML IJ SOLN
INTRAMUSCULAR | Status: DC | PRN
  Administered 2011-08-10: 0.6 mg via INTRAVENOUS

## 2011-08-10 MED ORDER — MIDAZOLAM HCL 2 MG/2ML IJ SOLN
INTRAMUSCULAR | Status: AC
Start: 2011-08-10 — End: 2011-08-10
  Filled 2011-08-10: qty 2

## 2011-08-10 MED ORDER — FENTANYL CITRATE 0.05 MG/ML IJ SOLN
INTRAMUSCULAR | Status: DC | PRN
  Administered 2011-08-10 (×2): 50 ug via INTRAVENOUS
  Administered 2011-08-10: 150 ug via INTRAVENOUS

## 2011-08-10 MED ORDER — MORPHINE SULFATE 2 MG/ML IJ SOLN
2.0000 mg | INTRAMUSCULAR | Status: DC | PRN
  Administered 2011-08-10: 2 mg via INTRAVENOUS
  Filled 2011-08-10: qty 1

## 2011-08-10 MED ORDER — RALOXIFENE HCL 60 MG PO TABS
60.0000 mg | ORAL_TABLET | Freq: Every day | ORAL | Status: DC
  Administered 2011-08-10 – 2011-08-11 (×2): 60 mg via ORAL
  Filled 2011-08-10 (×2): qty 1

## 2011-08-10 MED ORDER — HYDROCODONE-ACETAMINOPHEN 5-325 MG PO TABS: 1.0000 | ORAL_TABLET | ORAL | Status: DC | PRN

## 2011-08-10 MED ORDER — ACETAMINOPHEN 10 MG/ML IV SOLN
INTRAVENOUS | Status: DC | PRN
  Administered 2011-08-10: 1000 mg via INTRAVENOUS

## 2011-08-10 MED ORDER — HYDROCODONE-ACETAMINOPHEN 5-325 MG PO TABS: 1.0000 | ORAL_TABLET | ORAL | Status: AC | PRN

## 2011-08-10 MED ORDER — MIDAZOLAM HCL 5 MG/5ML IJ SOLN
INTRAMUSCULAR | Status: DC | PRN
  Administered 2011-08-10: 1 mg via INTRAVENOUS

## 2011-08-10 MED ORDER — POTASSIUM CHLORIDE IN NACL 20-0.9 MEQ/L-% IV SOLN
INTRAVENOUS | Status: DC
  Administered 2011-08-10: 17:00:00 via INTRAVENOUS
  Filled 2011-08-10 (×2): qty 1000

## 2011-08-10 MED ORDER — NEOSTIGMINE METHYLSULFATE 1 MG/ML IJ SOLN
INTRAMUSCULAR | Status: DC | PRN
  Administered 2011-08-10: 3 mg via INTRAVENOUS

## 2011-08-10 MED ORDER — LACTATED RINGERS IV SOLN
INTRAVENOUS | Status: DC | PRN
  Administered 2011-08-10: 12:00:00 via INTRAVENOUS

## 2011-08-10 MED ORDER — MAGNESIUM 30 MG PO TABS: 30.0000 mg | ORAL_TABLET | Freq: Two times a day (BID) | ORAL | Status: DC

## 2011-08-10 MED ORDER — TECHNETIUM TC 99M SULFUR COLLOID FILTERED
1.0000 | Freq: Once | INTRAVENOUS | Status: AC | PRN
  Administered 2011-08-10: 1 via INTRADERMAL

## 2011-08-10 MED ORDER — ONDANSETRON HCL 4 MG PO TABS: 4.0000 mg | ORAL_TABLET | Freq: Four times a day (QID) | ORAL | Status: DC | PRN

## 2011-08-10 MED ORDER — ROCURONIUM BROMIDE 100 MG/10ML IV SOLN
INTRAVENOUS | Status: DC | PRN
  Administered 2011-08-10: 40 mg via INTRAVENOUS

## 2011-08-10 MED ORDER — FENTANYL CITRATE 0.05 MG/ML IJ SOLN
50.0000 ug | INTRAMUSCULAR | Status: DC | PRN
  Administered 2011-08-10: 50 ug via INTRAVENOUS

## 2011-08-10 MED ORDER — METHYLENE BLUE 1 % INJ SOLN
INTRAMUSCULAR | Status: AC
  Filled 2011-08-10: qty 10

## 2011-08-10 SURGICAL SUPPLY — 49 items
APL SKNCLS STERI-STRIP NONHPOA (GAUZE/BANDAGES/DRESSINGS) ×1
APPLIER CLIP 9.375 MED OPEN (MISCELLANEOUS)
APR CLP MED 9.3 20 MLT OPN (MISCELLANEOUS)
BENZOIN TINCTURE PRP APPL 2/3 (GAUZE/BANDAGES/DRESSINGS) ×2 IMPLANT
BINDER BREAST LRG (GAUZE/BANDAGES/DRESSINGS) IMPLANT
BINDER BREAST XLRG (GAUZE/BANDAGES/DRESSINGS) IMPLANT
CANISTER SUCTION 2500CC (MISCELLANEOUS) ×2 IMPLANT
CHLORAPREP W/TINT 26ML (MISCELLANEOUS) ×2 IMPLANT
CLIP APPLIE 9.375 MED OPEN (MISCELLANEOUS) ×1 IMPLANT
CLOTH BEACON ORANGE TIMEOUT ST (SAFETY) ×2 IMPLANT
CONT SPEC 4OZ CLIKSEAL STRL BL (MISCELLANEOUS) ×2 IMPLANT
COVER PROBE W GEL 5X96 (DRAPES) ×2 IMPLANT
COVER SURGICAL LIGHT HANDLE (MISCELLANEOUS) ×2 IMPLANT
DRAIN CHANNEL 19F RND (DRAIN) ×2 IMPLANT
DRAPE LAPAROSCOPIC ABDOMINAL (DRAPES) ×2 IMPLANT
ELECT REM PT RETURN 9FT ADLT (ELECTROSURGICAL) ×2
ELECTRODE REM PT RTRN 9FT ADLT (ELECTROSURGICAL) ×1 IMPLANT
EVACUATOR SILICONE 100CC (DRAIN) ×2 IMPLANT
GLOVE BIO SURGEON STRL SZ7.5 (GLOVE) ×1 IMPLANT
GLOVE BIOGEL PI IND STRL 7.0 (GLOVE) IMPLANT
GLOVE BIOGEL PI IND STRL 7.5 (GLOVE) IMPLANT
GLOVE BIOGEL PI INDICATOR 7.0 (GLOVE) ×2
GLOVE BIOGEL PI INDICATOR 7.5 (GLOVE) ×1
GLOVE SURG SIGNA 7.5 PF LTX (GLOVE) ×2 IMPLANT
GLOVE SURG SS PI 7.0 STRL IVOR (GLOVE) ×1 IMPLANT
GOWN PREVENTION PLUS XLARGE (GOWN DISPOSABLE) ×2 IMPLANT
GOWN STRL NON-REIN LRG LVL3 (GOWN DISPOSABLE) ×4 IMPLANT
KIT BASIN OR (CUSTOM PROCEDURE TRAY) ×2 IMPLANT
KIT ROOM TURNOVER OR (KITS) ×2 IMPLANT
NDL 18GX1X1/2 (RX/OR ONLY) (NEEDLE) ×1 IMPLANT
NDL HYPO 25GX1X1/2 BEV (NEEDLE) ×1 IMPLANT
NEEDLE 18GX1X1/2 (RX/OR ONLY) (NEEDLE) ×2 IMPLANT
NEEDLE HYPO 25GX1X1/2 BEV (NEEDLE) ×2 IMPLANT
NS IRRIG 1000ML POUR BTL (IV SOLUTION) ×2 IMPLANT
PACK GENERAL/GYN (CUSTOM PROCEDURE TRAY) ×2 IMPLANT
PAD ARMBOARD 7.5X6 YLW CONV (MISCELLANEOUS) ×3 IMPLANT
SPECIMEN JAR X LARGE (MISCELLANEOUS) ×2 IMPLANT
SPONGE GAUZE 4X4 12PLY (GAUZE/BANDAGES/DRESSINGS) ×2 IMPLANT
STAPLER VISISTAT 35W (STAPLE) ×1 IMPLANT
STRIP CLOSURE SKIN 1/2X4 (GAUZE/BANDAGES/DRESSINGS) ×2 IMPLANT
SUT ETHILON 3 0 FSL (SUTURE) ×1 IMPLANT
SUT MON AB 4-0 PC3 18 (SUTURE) ×2 IMPLANT
SUT SILK 2 0 SH (SUTURE) ×1 IMPLANT
SUT VIC AB 3-0 SH 27 (SUTURE) ×4
SUT VIC AB 3-0 SH 27XBRD (SUTURE) ×1 IMPLANT
SYR CONTROL 10ML LL (SYRINGE) ×2 IMPLANT
TOWEL OR 17X24 6PK STRL BLUE (TOWEL DISPOSABLE) ×1 IMPLANT
TOWEL OR 17X26 10 PK STRL BLUE (TOWEL DISPOSABLE) ×2 IMPLANT
WATER STERILE IRR 1000ML POUR (IV SOLUTION) IMPLANT

## 2011-08-10 NOTE — Interval H&P Note (Signed)
History and Physical Interval Note:  No change in h and p  08/10/2011 12:28 PM  Robin Huynh  has presented today for surgery, with the diagnosis of right breast cancer  The various methods of treatment have been discussed with the patient and family. After consideration of risks, benefits and other options for treatment, the patient has consented to  Procedure(s) (LRB): MASTECTOMY WITH SENTINEL LYMPH NODE BIOPSY (Right) as a surgical intervention .  The patient's history has been reviewed, patient examined, no change in status, stable for surgery.  I have reviewed the patient's chart and labs.  Questions were answered to the patient's satisfaction.     Nilan Iddings A

## 2011-08-10 NOTE — Transfer of Care (Signed)
Immediate Anesthesia Transfer of Care Note  Patient: Robin Huynh  Procedure(s) Performed: Procedure(s) (LRB): MASTECTOMY WITH SENTINEL LYMPH NODE BIOPSY (Right)  Patient Location: PACU  Anesthesia Type: General  Level of Consciousness: awake  Airway & Oxygen Therapy: Patient Spontanous Breathing and Patient connected to nasal cannula oxygen  Post-op Assessment: Report given to PACU RN and Post -op Vital signs reviewed and stable  Post vital signs: Reviewed and stable  Complications: No apparent anesthesia complications

## 2011-08-10 NOTE — Anesthesia Postprocedure Evaluation (Signed)
  Anesthesia Post-op Note  Patient: Robin Huynh  Procedure(s) Performed: Procedure(s) (LRB): MASTECTOMY WITH SENTINEL LYMPH NODE BIOPSY (Right)  Patient Location: PACU  Anesthesia Type: General  Level of Consciousness: awake  Airway and Oxygen Therapy: Patient Spontanous Breathing  Post-op Pain: mild  Post-op Assessment: Post-op Vital signs reviewed  Post-op Vital Signs: Reviewed  Complications: No apparent anesthesia complications

## 2011-08-10 NOTE — Preoperative (Signed)
Beta Blockers   Reason not to administer Beta Blockers:Not Applicable 

## 2011-08-10 NOTE — Anesthesia Procedure Notes (Signed)
Procedure Name: Intubation Date/Time: 08/10/2011 1:09 PM Performed by: Tyrone Nine Pre-anesthesia Checklist: Patient identified, Timeout performed, Emergency Drugs available, Suction available and Patient being monitored Patient Re-evaluated:Patient Re-evaluated prior to inductionOxygen Delivery Method: Circle system utilized Preoxygenation: Pre-oxygenation with 100% oxygen Intubation Type: IV induction Ventilation: Mask ventilation without difficulty and Oral airway inserted - appropriate to patient size Laryngoscope Size: Mac and 3 Grade View: Grade II Tube type: Oral Tube size: 7.0 mm Number of attempts: 1 Airway Equipment and Method: Stylet Placement Confirmation: ETT inserted through vocal cords under direct vision,  breath sounds checked- equal and bilateral and positive ETCO2 Secured at: 21 cm Tube secured with: Tape Dental Injury: Teeth and Oropharynx as per pre-operative assessment

## 2011-08-10 NOTE — Anesthesia Preprocedure Evaluation (Addendum)
Anesthesia Evaluation  Patient identified by MRN, date of birth, ID band Patient awake    Reviewed: Allergy & Precautions, H&P , NPO status , Patient's Chart, lab work & pertinent test results  Airway Mallampati: II TM Distance: >3 FB   Mouth opening: Limited Mouth Opening  Dental  (+) Dental Advisory Given and Teeth Intact   Pulmonary neg pulmonary ROS,    Pulmonary exam normal       Cardiovascular hypertension, Pt. on medications Rate:Normal     Neuro/Psych negative neurological ROS     GI/Hepatic negative GI ROS, Neg liver ROS,   Endo/Other  Hypothyroidism   Renal/GU negative Renal ROS     Musculoskeletal  (+) Arthritis -, Osteoarthritis,    Abdominal   Peds  Hematology negative hematology ROS (+)   Anesthesia Other Findings All teeth are darkened. Denies any loose or chipped teeth.  Reproductive/Obstetrics                         Anesthesia Physical Anesthesia Plan  ASA: II  Anesthesia Plan: General   Post-op Pain Management:    Induction: Intravenous  Airway Management Planned: Oral ETT  Additional Equipment:   Intra-op Plan:   Post-operative Plan: Extubation in OR  Informed Consent: I have reviewed the patients History and Physical, chart, labs and discussed the procedure including the risks, benefits and alternatives for the proposed anesthesia with the patient or authorized representative who has indicated his/her understanding and acceptance.   Dental advisory given  Plan Discussed with: CRNA  Anesthesia Plan Comments:         Anesthesia Quick Evaluation

## 2011-08-10 NOTE — Op Note (Signed)
MASTECTOMY WITH SENTINEL LYMPH NODE BIOPSY  Procedure Note  Robin Huynh 08/10/2011   Pre-op Diagnosis: RIGHT BREAST DCIS     Post-op Diagnosis: SAM3  Procedure(s): RIGHT SIMPLE MASTECTOMY WITH SENTINEL LYMPH NODE BIOPSY  Surgeon(s): Shelly Rubenstein, MD  Anesthesia: General  Staff:  Sudie Bailey, RN - Circulator Janeece Agee Pingue, CST - Scrub Person Leighton Parody - Scrub Person Megan Day Cavanaugh, RN - Circulator  Estimated Blood Loss: Minimal               Specimens: right breast and one sentinel node          [name]   Date: 08/10/2011  Time: 2:03 PM

## 2011-08-10 NOTE — Op Note (Signed)
NAMEKENIYA, Robin Huynh NO.:  [id number]  MEDICAL RECORD NO.:  [id number]  LOCATION:  [id number]                        FACILITY:  [facility]  PHYSICIAN:  Abigail Miyamoto, M.D. DATE OF BIRTH:  24-Apr-1934  DATE OF PROCEDURE:  08/10/2011 DATE OF DISCHARGE:                              OPERATIVE REPORT   PREOPERATIVE DIAGNOSIS:  Right breast ductal carcinoma in situ.  POSTOPERATIVE DIAGNOSIS:  Right breast ductal carcinoma in situ.  PROCEDURE:  Right simple mastectomy with right sentinel lymph node biopsy.  SURGEON:  Abigail Miyamoto, MD  ANESTHESIA:  General.  ESTIMATED BLOOD LOSS:  Minimal.  INDICATIONS:  This is a 77 year old female was found to have multifocal ductal carcinoma in situ of the right breast.  Decision was made to proceed with mastectomy and sentinel node biopsy.  FINDINGS:  The patient was found to have 1 sentinel lymph node identified in the axilla, which was sent to pathology for evaluation.  PROCEDURE IN DETAIL:  The patient was brought to the operating room identified as [name].  She had already had radioisotope injected around the areola in the holding area.  She was identified as the correct patient.  She was placed on the operating room table and general anesthesia was induced.  I injected blue dye underneath the right areola and massaged the breast.  Her right breast and chest were prepped and draped in usual sterile fashion.  I performed elliptical incision moving from the sternum to the axilla with a scalpel incorporating the areola. I took this down the breast tissue with electrocautery.  I first dissected out the superior skin flap staying close to just underneath the dermis and taken this up to the level of the clavicle.  I then dissected out the inferior skin flap as well taking it down to the inframammary ridge with electrocautery.  I then removed the breast from the pectoralis muscle with electrocautery moving medial to  lateral. Once I reached the axilla, I could already identify 1 lymph node with increased uptake of the blue dye.  It also showed increased uptake with the radioisotope with the Neoprobe.  This lymph node was dissected out and sent to pathology for evaluation.  Once this node was removed, no other enlarged lymph nodes were identified containing either radioisotope or blue dye.  I then completed the mastectomy with the electrocautery.  I marked the lateral margin with a suture.  The breast was then sent to pathology for evaluation.  I then thoroughly irrigated the chest wall with normal saline.  Hemostasis appeared to be achieved. I made a separate skin incision, placed a 19-French Blake drain into the incision.  I sewed this in place with a 3-0 silk suture.  I then closed subcutaneous tissue with interrupted 3-0 Vicryl sutures and closed the skin with a running 4-0 Monocryl. Steri-Strips, gauze, and a binder were then applied.  The patient tolerated procedure well.  All the counts were correct at the end of procedure.  The patient was then extubated in the operating room and taken in stable condition to recovery room.     Abigail Miyamoto, M.D.     DB/MEDQ  D:  08/10/2011  T:  08/10/2011  Job:  [id number]

## 2011-08-11 LAB — CBC
HCT: 34 % — ABNORMAL LOW (ref 36.0–46.0)
MCHC: 33.2 g/dL (ref 30.0–36.0)
MCV: 85.6 fL (ref 78.0–100.0)
RDW: 14.2 % (ref 11.5–15.5)

## 2011-08-11 LAB — BASIC METABOLIC PANEL
BUN: 13 mg/dL (ref 6–23)
Chloride: 104 mEq/L (ref 96–112)
Creatinine, Ser: 0.9 mg/dL (ref 0.50–1.10)
GFR calc Af Amer: 71 mL/min — ABNORMAL LOW (ref >90)

## 2011-08-11 NOTE — Discharge Summary (Signed)
   Patient ID: Robin Huynh [id number] 77 y.o. 1934-03-12  08/10/2011  Discharge date and time: 08/11/2011   Admitting Physician: Glenna Fellows T  Discharge Physician: Glenna Fellows T  Admission Diagnoses: right breast cancer  Discharge Diagnoses: same  Operations: Procedure(s): MASTECTOMY WITH SENTINEL LYMPH NODE BIOPSY  Admission Condition: good  Discharged Condition: good  Indication for Admission: patient admitted electively for right mastectomy and sentinel lymph node biopsy for cancer of the right breast  Hospital Course: patient underwent uneventful right total mastectomy with sentinel lymph node biopsy. On the first postoperative day she was comfortable with stable vital signs. Dressing is dry without swelling or bleeding. JP drainage is appropriate. She is ready for discharge on the first postoperative morning.  Disposition: Home  Patient Instructions:   [name], [name]  Home Medication Instructions NWG:[id number]   Printed on:08/11/11 0840  Medication Information                    raloxifene (EVISTA) 60 MG tablet Take 60 mg by mouth daily.           levothyroxine (SYNTHROID, LEVOTHROID) 100 MCG tablet Take 50-100 mcg by mouth daily. Take 100 mcg on Mon wed and fri and 1/2 tablet the rest of the week           fish oil-omega-3 fatty acids 1000 MG capsule Take 2 g by mouth daily.           magnesium 30 MG tablet Take 30 mg by mouth 2 (two) times daily.           aspirin 81 MG tablet Take 81 mg by mouth daily.           Red Yeast Rice 600 MG CAPS Take 600 mg by mouth daily.            CALCIUM PO Take 1 tablet by mouth daily.           Multiple Vitamin (MULTIVITAMIN) tablet Take 1 tablet by mouth daily.           vitamin C (ASCORBIC ACID) 500 MG tablet Take 500 mg by mouth daily.           HYDROcodone-acetaminophen (NORCO) 5-325 MG per tablet Take 1-2 tablets by mouth every 4 (four) hours as needed for pain.             Activity:  activity as tolerated Diet: regular diet Wound Care: keep dressing dry for 2 days. May then remove dressing and shower or may leave on until office visit  Follow-up:  With Dr. Magnus Ivan in 1 week.  Signed: Mariella Saa MD, FACS  08/11/2011, 8:40 AM

## 2011-08-13 ENCOUNTER — Encounter (HOSPITAL_COMMUNITY): Payer: Self-pay | Admitting: Surgery

## 2011-08-16 ENCOUNTER — Encounter (INDEPENDENT_AMBULATORY_CARE_PROVIDER_SITE_OTHER): Payer: Self-pay | Admitting: Surgery

## 2011-08-16 ENCOUNTER — Ambulatory Visit (INDEPENDENT_AMBULATORY_CARE_PROVIDER_SITE_OTHER): Payer: Medicare Other | Admitting: Surgery

## 2011-08-16 VITALS — BP 132/74 | HR 66 | Temp 96.8°F | Ht 62.0 in | Wt 137.8 lb

## 2011-08-16 DIAGNOSIS — Z09 Encounter for follow-up examination after completed treatment for conditions other than malignant neoplasm: Secondary | ICD-10-CM

## 2011-08-16 NOTE — Progress Notes (Signed)
Subjective:     Patient ID: Robin Huynh, female   DOB: 1934/11/02, 77 y.o.   MRN: [id number]  HPI She is here for her first postop visit status post right mastectomy. She is doing well. She has actually not been applying suction to the Jackson-Pratt drain  Review of Systems     Objective:   Physical Exam On exam, there is a seroma present but I was able to drain it to drain. Her final pathology showed multifocal ductal carcinoma in situ. Lymph nodes and margins were negative    Assessment:     Patient status post right mastectomy. Doing well    Plan:     She will continue the drain monitoring and I'll see her back on Monday

## 2011-08-17 ENCOUNTER — Encounter (INDEPENDENT_AMBULATORY_CARE_PROVIDER_SITE_OTHER): Payer: Self-pay

## 2011-08-20 ENCOUNTER — Encounter (INDEPENDENT_AMBULATORY_CARE_PROVIDER_SITE_OTHER): Payer: Self-pay | Admitting: Surgery

## 2011-08-20 ENCOUNTER — Ambulatory Visit (INDEPENDENT_AMBULATORY_CARE_PROVIDER_SITE_OTHER): Payer: Medicare Other | Admitting: Surgery

## 2011-08-20 VITALS — BP 160/70 | HR 52 | Temp 96.2°F | Ht 62.0 in | Wt 137.6 lb

## 2011-08-20 DIAGNOSIS — Z09 Encounter for follow-up examination after completed treatment for conditions other than malignant neoplasm: Secondary | ICD-10-CM

## 2011-08-20 NOTE — Progress Notes (Signed)
Subjective:     Patient ID: Robin Huynh, female   DOB: March 08, 1934, 77 y.o.   MRN: [id number]  HPI She is here for postop visit status post right mastectomy. She reports the drainage is decreasing. She has no complaints  Review of Systems     Objective:   Physical Exam    Her incision is healing well. I removed her drain Assessment:     Patient status post right mastectomy for ductal carcinoma in situ, multifocal    Plan:     I will see her back in 2 weeks to see if a seroma has occurred

## 2011-08-24 ENCOUNTER — Ambulatory Visit (HOSPITAL_BASED_OUTPATIENT_CLINIC_OR_DEPARTMENT_OTHER): Payer: Medicare Other | Admitting: Oncology

## 2011-08-24 ENCOUNTER — Telehealth: Payer: Self-pay | Admitting: Oncology

## 2011-08-24 ENCOUNTER — Other Ambulatory Visit: Payer: Medicare Other | Admitting: Lab

## 2011-08-24 ENCOUNTER — Encounter: Payer: Self-pay | Admitting: Oncology

## 2011-08-24 VITALS — BP 146/62 | HR 60 | Temp 97.9°F | Resp 20 | Ht 62.0 in | Wt 139.9 lb

## 2011-08-24 DIAGNOSIS — Z9010 Acquired absence of unspecified breast and nipple: Secondary | ICD-10-CM

## 2011-08-24 DIAGNOSIS — C50519 Malignant neoplasm of lower-outer quadrant of unspecified female breast: Secondary | ICD-10-CM

## 2011-08-24 DIAGNOSIS — D0590 Unspecified type of carcinoma in situ of unspecified breast: Secondary | ICD-10-CM

## 2011-08-24 DIAGNOSIS — Z170 Estrogen receptor positive status [ER+]: Secondary | ICD-10-CM

## 2011-08-24 MED ORDER — TAMOXIFEN CITRATE 20 MG PO TABS: 20.0000 mg | ORAL_TABLET | Freq: Every day | ORAL | Status: DC

## 2011-08-24 NOTE — Progress Notes (Signed)
OFFICE PROGRESS NOTE  CC  MCKEOWN,WILLIAM DAVID, MD [address] [place] [address] Dr. Abigail Miyamoto  DIAGNOSIS: 77 year old female with intermediate grade ductal carcinoma in situ diagnosed 06/19/2011. Patient is status post right mastectomy.  PRIOR THERAPY:  #1 patient was originally seen in the multidisciplinary breast clinic with new diagnosis of intermediate grade ductal carcinoma in situ that was ER +100% PR +76%.  #2 patient has gone on to have a right mastectomy performed on 08/13/2011. Her final pathology revealed 2 foci of DCIS measuring 1.2 and 1.1 cm all surgical margins were negative one sentinel node was negative for metastatic disease.  #3 patient will start chemoprevention with tamoxifen 20 mg daily For the contralateral breast.  #3 patient has been on Evista and I have recommended she that she discontinue this  CURRENT THERAPY:Tamoxifen 20 mg daily  INTERVAL HISTORY: Robin Huynh 77 y.o. female returns for Followup visit post mastectomy. Overall she tolerated the procedure well she does have some fluid collection and that is being observed. Otherwise she denies any fevers chills night sweats aches pains nausea vomiting no myalgias or arthralgias. Overall she feels well. Remainder of the 10 point review of systems is negative.  MEDICAL HISTORY: Past Medical History  Diagnosis Date  . Hypothyroidism   . Breast cancer 08/2011    right    ALLERGIES:   has no known allergies.  MEDICATIONS:  Current Outpatient Prescriptions  Medication Sig Dispense Refill  . aspirin 81 MG tablet Take 81 mg by mouth daily.      Marland Kitchen CALCIUM PO Take 1 tablet by mouth daily.      . fish oil-omega-3 fatty acids 1000 MG capsule Take 2 g by mouth daily.      Marland Kitchen levothyroxine (SYNTHROID, LEVOTHROID) 100 MCG tablet Take 50-100 mcg by mouth daily. Take 100 mcg on Mon wed and fri and 1/2 tablet the rest of the week      . magnesium 30 MG tablet Take 30 mg by mouth 2  (two) times daily.      . Multiple Vitamin (MULTIVITAMIN) tablet Take 1 tablet by mouth daily.      . raloxifene (EVISTA) 60 MG tablet Take 60 mg by mouth daily.      . Red Yeast Rice 600 MG CAPS Take 600 mg by mouth daily.       . vitamin C (ASCORBIC ACID) 500 MG tablet Take 500 mg by mouth daily.        SURGICAL HISTORY:  Past Surgical History  Procedure Date  . Breast surgery     right breast  . Colonoscopy   . Mastectomy complete / simple w/ sentinel node biopsy 08/10/11    right  . Mastectomy w/ sentinel node biopsy 08/10/2011    Procedure: MASTECTOMY WITH SENTINEL LYMPH NODE BIOPSY;  Surgeon: Shelly Rubenstein, MD;  Location: [facility] OR;  Service: General;  Laterality: Right;    REVIEW OF SYSTEMS:  Pertinent items are noted in HPI.   PHYSICAL EXAMINATION: General appearance: alert, cooperative and appears stated age Resp: clear to auscultation bilaterally Back: symmetric, no curvature. ROM normal. No CVA tenderness. Cardio: regularly irregular rhythm GI: soft, non-tender; bowel sounds normal; no masses,  no organomegaly Extremities: extremities normal, atraumatic, no cyanosis or edema Neurologic: Grossly normal Right incisional scar looks well healed there is seroma fluid collection noted. Left breast no masses or nipple discharge  ECOG PERFORMANCE STATUS: 0 - Asymptomatic  Blood pressure 146/62, pulse 60, temperature 97.9 F (36.6 C),  resp. rate 20, height 5\' 2"  (1.575 m), weight 139 lb 14.4 oz (63.458 kg).  LABORATORY DATA: Lab Results  Component Value Date   WBC 5.9 08/11/2011   HGB 11.3* 08/11/2011   HCT 34.0* 08/11/2011   MCV 85.6 08/11/2011   PLT 133* 08/11/2011      Chemistry      Component Value Date/Time   NA 138 08/11/2011 0641   K 4.0 08/11/2011 0641   CL 104 08/11/2011 0641   CO2 27 08/11/2011 0641   BUN 13 08/11/2011 0641   CREATININE 0.90 08/11/2011 0641      Component Value Date/Time   CALCIUM 8.8 08/11/2011 0641   ALKPHOS 66 06/27/2011 0800   AST 27 06/27/2011 0800    ALT 8 06/27/2011 0800   BILITOT 0.2* 06/27/2011 0800    FINAL DIAGNOSIS Diagnosis 1. Breast, simple mastectomy, right - DUCTAL CARCINOMA IN SITU WITH CALCIFICATIONS, LOW GRADE, 2 FOCI SPANNING 1.2 AND 1.1 CM. - THE SURGICAL RESECTION MARGINS ARE NEGATIVE FOR DUCTAL CARCINOMA. - THERE IS NO EVIDENCE OF CARCINOMA IN 1 OF 1 LYMPH NODE (0/1). - SEE ONCOLOGY TABLE BELOW. 2. Lymph node, sentinel, biopsy, Right axillary - THERE IS NO EVIDENCE OF CARCINOMA IN 1 OF 1 LYMPH NODE (0/1). Microscopic Comment 1. BREAST, IN SITU CARCINOMA Specimen, including laterality: Right breast. Procedure: Simple mastectomy. Grade of carcinoma: Low grade. Necrosis: Not identified. Estimated tumor size: (gross measurements): 1.2 and 1.1 cm. Treatment effect: N/A. Distance to closest margin: 1.1 cm to the deep margin (gross measurement). Breast prognostic profile: [phone]. Estrogen receptor: 100%, moderate staining intensity. Progesterone receptor: 76%, moderate staining intensity. Lymph nodes: Examined: 2. Lymph nodes with metastasis: 0. TNM: pTis, pN0. Comments: There are two foci of similar-appearing low grade ductal carcinoma in situ. The focus in the lower lateral quadrant spans 1.1 cm and the focus in the lower central breast spans 1.2 cm. (JBK:eps 08/13/11) Pecola Leisure MD Pathologist, Electronic Signature (Case signed 08  RADIOGRAPHIC STUDIES:   ASSESSMENT: 77 year old female with  #1 low-grade ductal carcinoma in situ of the right breast that was ER positive PR positive. Patient is status post mastectomy with sentinel lymph node biopsy that revealed 2 foci of disease in the lower lateral quadrant spanning 1.1 and the second lower central breast spanning 1.2 cm. Postoperatively patient is doing well her sentinel node was negative.  #2 patient and I discussed chemoprevention for the contralateral breast she is very interested in doing everything that she possibly can she is very concerned  about developing breast cancer in the left breast. We therefore discussed utilization of tamoxifen as a chemopreventive agent. Of note patient has been on Evista in the past I did ask her to discontinue that. We discussed side effects of tamoxifen. She was given literature on it as well. She demonstrated clear understanding ample opportunity was given to her for asking questions. She understands that she needs to be seen by a gynecologist as well as an ophthalmologist. She also knows to call me if she develops lower extremity swelling. The chest calf tenderness or redness.   PLAN:    #1 patient will begin tamoxifen 20 mg daily a prescription was sent to her pharmacy.  #2 patient will return in 3 months time for followup she certainly can come back and see me sooner if need arises.   All questions were answered. The patient knows to call the clinic with any problems, questions or concerns. We can certainly see the patient much sooner if necessary.  I spent 25 minutes counseling the patient face to face. The total time spent in the appointment was 30 minutes.    Drue Second, MD Medical/Oncology [facility] [phone] (beeper) [phone] (Office)  08/24/2011, 11:00 AM

## 2011-08-24 NOTE — Telephone Encounter (Signed)
gve the pt her nov 2013 appt calendar °

## 2011-08-24 NOTE — Patient Instructions (Addendum)
1. We discussed your pathology today  2. Recommendation and discussion of prevention of cancer in the left breast with tamoxifen  3. Discontinue evista  4. Information on Tamoxifen as below  5. I will see you back in 3 months in follow up  6. Please call with any problems  Tamoxifen oral tablet What is this medicine? TAMOXIFEN (ta MOX i fen) blocks the effects of estrogen. It is commonly used to treat breast cancer. It is also used to decrease the chance of breast cancer coming back in women who have received treatment for the disease. It may also help prevent breast cancer in women who have a high risk of developing breast cancer. This medicine may be used for other purposes; ask your health care provider or pharmacist if you have questions. What should I tell my health care provider before I take this medicine? They need to know if you have any of these conditions: -blood clots -blood disease -cataracts or impaired eyesight -endometriosis -high calcium levels -high cholesterol -irregular menstrual cycles -liver disease -stroke -uterine fibroids -an unusual or allergic reaction to tamoxifen, other medicines, foods, dyes, or preservatives -pregnant or trying to get pregnant -breast-feeding How should I use this medicine? Take this medicine by mouth with a glass of water. Follow the directions on the prescription label. You can take it with or without food. Take your medicine at regular intervals. Do not take your medicine more often than directed. Do not stop taking except on your doctor's advice. A special MedGuide will be given to you by the pharmacist with each prescription and refill. Be sure to read this information carefully each time. Talk to your pediatrician regarding the use of this medicine in children. While this drug may be prescribed for selected conditions, precautions do apply. Overdosage: If you think you have taken too much of this medicine contact a poison  control center or emergency room at once. NOTE: This medicine is only for you. Do not share this medicine with others. What if I miss a dose? If you miss a dose, take it as soon as you can. If it is almost time for your next dose, take only that dose. Do not take double or extra doses. What may interact with this medicine? -aminoglutethimide -bromocriptine -chemotherapy drugs -female hormones, like estrogens and birth control pills -letrozole -medroxyprogesterone -phenobarbital -rifampin -warfarin This list may not describe all possible interactions. Give your health care provider a list of all the medicines, herbs, non-prescription drugs, or dietary supplements you use. Also tell them if you smoke, drink alcohol, or use illegal drugs. Some items may interact with your medicine. What should I watch for while using this medicine? Visit your doctor or health care professional for regular checks on your progress. You will need regular pelvic exams, breast exams, and mammograms. If you are taking this medicine to reduce your risk of getting breast cancer, you should know that this medicine does not prevent all types of breast cancer. If breast cancer or other problems occur, there is no guarantee that it will be found at an early stage. Do not become pregnant while taking this medicine or for 2 months after stopping this medicine. Stop taking this medicine if you get pregnant or think you are pregnant and contact your doctor. This medicine may harm your unborn baby. Women who can possibly become pregnant should use birth control methods that do not use hormones during tamoxifen treatment and for 2 months after therapy has stopped. Talk with  your health care provider for birth control advice. Do not breast feed while taking this medicine. What side effects may I notice from receiving this medicine? Side effects that you should report to your doctor or health care professional as soon as  possible: -changes in vision (blurred vision) -changes in your menstrual cycle -difficulty breathing or shortness of breath -difficulty walking or talking -new breast lumps -numbness -pelvic pain or pressure -redness, blistering, peeling or loosening of the skin, including inside the mouth -skin rash or itching (hives) -sudden chest pain -swelling of lips, face, or tongue -swelling, pain or tenderness in your calf or leg -unusual bruising or bleeding -vaginal discharge that is bloody, brown, or rust -weakness -yellowing of the whites of the eyes or skin Side effects that usually do not require medical attention (report to your doctor or health care professional if they continue or are bothersome): -fatigue -hair loss, although uncommon and is usually mild -headache -hot flashes -impotence (in men) -nausea, vomiting (mild) -vaginal discharge (white or clear) This list may not describe all possible side effects. Call your doctor for medical advice about side effects. You may report side effects to FDA at 1-800-FDA-1088. Where should I keep my medicine? Keep out of the reach of children. Store at room temperature between 20 and 25 degrees C (68 and 77 degrees F). Protect from light. Keep container tightly closed. Throw away any unused medicine after the expiration date. NOTE: This sheet is a summary. It may not cover all possible information. If you have questions about this medicine, talk to your doctor, pharmacist, or health care provider.  2012, [employer]vier/[employer] Standard. (09/11/2007 12:01:56 PM)

## 2011-09-03 ENCOUNTER — Ambulatory Visit (INDEPENDENT_AMBULATORY_CARE_PROVIDER_SITE_OTHER): Payer: Medicare Other | Admitting: Surgery

## 2011-09-03 ENCOUNTER — Encounter (INDEPENDENT_AMBULATORY_CARE_PROVIDER_SITE_OTHER): Payer: Self-pay | Admitting: Surgery

## 2011-09-03 VITALS — BP 118/80 | HR 63 | Temp 97.2°F | Resp 12 | Ht 62.0 in | Wt 138.2 lb

## 2011-09-03 DIAGNOSIS — Z09 Encounter for follow-up examination after completed treatment for conditions other than malignant neoplasm: Secondary | ICD-10-CM

## 2011-09-03 NOTE — Progress Notes (Signed)
Subjective:     Patient ID: Robin Huynh, female   DOB: 08/11/1934, 77 y.o.   MRN: [id number]  HPI She is doing well and has no complaints.  Review of Systems     Objective:   Physical Exam    I did not feel any seroma at the right mastectomy site Assessment:     Patient stable postop    Plan:     I will see her back in 3 months unless there is any problems. I will write her a prescription for post mastectomy bras

## 2011-11-26 ENCOUNTER — Ambulatory Visit (HOSPITAL_BASED_OUTPATIENT_CLINIC_OR_DEPARTMENT_OTHER): Payer: Medicare Other | Admitting: Oncology

## 2011-11-26 ENCOUNTER — Telehealth: Payer: Self-pay | Admitting: Oncology

## 2011-11-26 ENCOUNTER — Other Ambulatory Visit (HOSPITAL_BASED_OUTPATIENT_CLINIC_OR_DEPARTMENT_OTHER): Payer: Medicare Other | Admitting: Lab

## 2011-11-26 ENCOUNTER — Encounter: Payer: Self-pay | Admitting: Oncology

## 2011-11-26 VITALS — BP 135/74 | HR 51 | Temp 97.8°F | Resp 20 | Ht 62.0 in | Wt 140.8 lb

## 2011-11-26 DIAGNOSIS — C50519 Malignant neoplasm of lower-outer quadrant of unspecified female breast: Secondary | ICD-10-CM

## 2011-11-26 DIAGNOSIS — Z170 Estrogen receptor positive status [ER+]: Secondary | ICD-10-CM

## 2011-11-26 DIAGNOSIS — D0590 Unspecified type of carcinoma in situ of unspecified breast: Secondary | ICD-10-CM

## 2011-11-26 LAB — CBC WITH DIFFERENTIAL/PLATELET
BASO%: 0.5 % (ref 0.0–2.0)
EOS%: 2 % (ref 0.0–7.0)
HCT: 36.2 % (ref 34.8–46.6)
LYMPH%: 29.4 % (ref 14.0–49.7)
MCH: 29.2 pg (ref 25.1–34.0)
MCHC: 33.4 g/dL (ref 31.5–36.0)
NEUT%: 56.3 % (ref 38.4–76.8)
lymph#: 1.7 10*3/uL (ref 0.9–3.3)

## 2011-11-26 LAB — COMPREHENSIVE METABOLIC PANEL (CC13)
ALT: 11 U/L (ref 0–55)
Albumin: 3.2 g/dL — ABNORMAL LOW (ref 3.5–5.0)
BUN: 18 mg/dL (ref 7.0–26.0)
CO2: 28 mEq/L (ref 22–29)
Calcium: 9.6 mg/dL (ref 8.4–10.4)
Chloride: 106 mEq/L (ref 98–107)
Creatinine: 0.9 mg/dL (ref 0.6–1.1)
Potassium: 4 mEq/L (ref 3.5–5.1)

## 2011-11-26 NOTE — Patient Instructions (Addendum)
Continue tamoxifen daily  We will see you back in 6 months 

## 2011-11-26 NOTE — Progress Notes (Signed)
OFFICE PROGRESS NOTE  CC  Huynh,Robin DAVID, MD [address] [place] [address] Dr. Abigail Huynh  DIAGNOSIS: 77 year old female with intermediate grade ductal carcinoma in situ diagnosed 06/19/2011. Patient is status post right mastectomy.  PRIOR THERAPY:  #1 patient was originally seen in the multidisciplinary breast clinic with new diagnosis of intermediate grade ductal carcinoma in situ that was ER +100% PR +76%.  #2 patient has gone on to have a right mastectomy performed on 08/13/2011. Her final pathology revealed 2 foci of DCIS measuring 1.2 and 1.1 cm all surgical margins were negative one sentinel node was negative for metastatic disease.  #3 patient will start chemoprevention with tamoxifen 20 mg daily For the contralateral breast.  #3 patient has been on Evista and I have recommended  that she discontinue this  CURRENT THERAPY:Tamoxifen 20 mg daily  INTERVAL HISTORY: Robin Huynh 77 y.o. female returns for Followup visit  Overall she tolerated the tamoxifen well so far..  Otherwise she denies any fevers chills night sweats aches pains nausea vomiting no myalgias or arthralgias. Overall she feels well. Remainder of the 10 point review of systems is negative.  MEDICAL HISTORY: Past Medical History  Diagnosis Date  . Hypothyroidism   . Breast cancer 08/2011    right    ALLERGIES:   has no known allergies.  MEDICATIONS:  Current Outpatient Prescriptions  Medication Sig Dispense Refill  . aspirin 81 MG tablet Take 81 mg by mouth daily.      Marland Kitchen CALCIUM PO Take 1 tablet by mouth daily.      . fish oil-omega-3 fatty acids 1000 MG capsule Take 2 g by mouth daily.      Marland Kitchen levothyroxine (SYNTHROID, LEVOTHROID) 100 MCG tablet Take 50-100 mcg by mouth daily. Take 100 mcg on Mon wed and fri and 1/2 tablet the rest of the week      . magnesium 30 MG tablet Take 30 mg by mouth 2 (two) times daily.      . Multiple Vitamin (MULTIVITAMIN) tablet Take 1  tablet by mouth daily.      . raloxifene (EVISTA) 60 MG tablet Take 60 mg by mouth daily.      . Red Yeast Rice 600 MG CAPS Take 600 mg by mouth daily.       . vitamin C (ASCORBIC ACID) 500 MG tablet Take 500 mg by mouth daily.        SURGICAL HISTORY:  Past Surgical History  Procedure Date  . Breast surgery     right breast  . Colonoscopy   . Mastectomy complete / simple w/ sentinel node biopsy 08/10/11    right  . Mastectomy w/ sentinel node biopsy 08/10/2011    Procedure: MASTECTOMY WITH SENTINEL LYMPH NODE BIOPSY;  Surgeon: Shelly Rubenstein, MD;  Location: [facility] OR;  Service: General;  Laterality: Right;    REVIEW OF SYSTEMS:  Pertinent items are noted in HPI.   PHYSICAL EXAMINATION: General appearance: alert, cooperative and appears stated age Resp: clear to auscultation bilaterally Back: symmetric, no curvature. ROM normal. No CVA tenderness. Cardio: regularly irregular rhythm GI: soft, non-tender; bowel sounds normal; no masses,  no organomegaly Extremities: extremities normal, atraumatic, no cyanosis or edema Neurologic: Grossly normal Right incisional scar looks well healed there is seroma fluid collection noted. Left breast no masses or nipple discharge  ECOG PERFORMANCE STATUS: 0 - Asymptomatic  Blood pressure 135/74, pulse 51, temperature 97.8 F (36.6 C), temperature source Oral, resp. rate 20, height 5\' 2"  (  1.575 m), weight 140 lb 12.8 oz (63.866 kg).  LABORATORY DATA: Lab Results  Component Value Date   WBC 5.9 11/26/2011   HGB 12.1 11/26/2011   HCT 36.2 11/26/2011   MCV 87.5 11/26/2011   PLT 132* 11/26/2011      Chemistry      Component Value Date/Time   NA 138 08/11/2011 0641   K 4.0 08/11/2011 0641   CL 104 08/11/2011 0641   CO2 27 08/11/2011 0641   BUN 13 08/11/2011 0641   CREATININE 0.90 08/11/2011 0641      Component Value Date/Time   CALCIUM 8.8 08/11/2011 0641   ALKPHOS 66 06/27/2011 0800   AST 27 06/27/2011 0800   ALT 8 06/27/2011 0800   BILITOT 0.2*  06/27/2011 0800    FINAL DIAGNOSIS Diagnosis 1. Breast, simple mastectomy, right - DUCTAL CARCINOMA IN SITU WITH CALCIFICATIONS, LOW GRADE, 2 FOCI SPANNING 1.2 AND 1.1 CM. - THE SURGICAL RESECTION MARGINS ARE NEGATIVE FOR DUCTAL CARCINOMA. - THERE IS NO EVIDENCE OF CARCINOMA IN 1 OF 1 LYMPH NODE (0/1). - SEE ONCOLOGY TABLE BELOW. 2. Lymph node, sentinel, biopsy, Right axillary - THERE IS NO EVIDENCE OF CARCINOMA IN 1 OF 1 LYMPH NODE (0/1). Microscopic Comment 1. BREAST, IN SITU CARCINOMA Specimen, including laterality: Right breast. Procedure: Simple mastectomy. Grade of carcinoma: Low grade. Necrosis: Not identified. Estimated tumor size: (gross measurements): 1.2 and 1.1 cm. Treatment effect: N/A. Distance to closest margin: 1.1 cm to the deep margin (gross measurement). Breast prognostic profile: [phone]. Estrogen receptor: 100%, moderate staining intensity. Progesterone receptor: 76%, moderate staining intensity. Lymph nodes: Examined: 2. Lymph nodes with metastasis: 0. TNM: pTis, pN0. Comments: There are two foci of similar-appearing low grade ductal carcinoma in situ. The focus in the lower lateral quadrant spans 1.1 cm and the focus in the lower central breast spans 1.2 cm. (JBK:eps 08/13/11) Pecola Leisure MD Pathologist, Electronic Signature (Case signed 08  RADIOGRAPHIC STUDIES:   ASSESSMENT: 77 year old female with  #1 low-grade ductal carcinoma in situ of the right breast that was ER positive PR positive. Patient is status post mastectomy with sentinel lymph node biopsy that revealed 2 foci of disease in the lower lateral quadrant spanning 1.1 and the second lower central breast spanning 1.2 cm. Postoperatively patient is doing well her sentinel node was negative.  #2 patient and I discussed chemoprevention for the contralateral breast she is very interested in doing everything that she possibly can she is very concerned about developing breast cancer in the  left breast. We therefore discussed utilization of tamoxifen as a chemopreventive agent. Of note patient has been on Evista in the past I did ask her to discontinue that. We discussed side effects of tamoxifen. She was given literature on it as well. She demonstrated clear understanding ample opportunity was given to her for asking questions. She understands that she needs to be seen by a gynecologist as well as an ophthalmologist. She also knows to call me if she develops lower extremity swelling. The chest calf tenderness or redness.   PLAN:  #1 the patient is tolerating tamoxifen well without any problems except for some hot flashes.  #2 she will be seen back in 6 months time  All questions were answered. The patient knows to call the clinic with any problems, questions or concerns. We can certainly see the patient much sooner if necessary.  I spent 25 minutes counseling the patient face to face. The total time spent in the appointment was 30 minutes.  Drue Second, MD Medical/Oncology [facility] [phone] (beeper) [phone] (Office)  11/26/2011, 12:06 PM

## 2011-11-26 NOTE — Telephone Encounter (Signed)
gve the pt her may 2014 appt calendar 

## 2011-12-03 ENCOUNTER — Encounter (INDEPENDENT_AMBULATORY_CARE_PROVIDER_SITE_OTHER): Payer: Self-pay | Admitting: Surgery

## 2011-12-03 ENCOUNTER — Ambulatory Visit (INDEPENDENT_AMBULATORY_CARE_PROVIDER_SITE_OTHER): Payer: Medicare Other | Admitting: Surgery

## 2011-12-03 VITALS — BP 122/76 | HR 70 | Temp 97.0°F | Resp 14 | Ht 62.0 in | Wt 140.6 lb

## 2011-12-03 DIAGNOSIS — Z853 Personal history of malignant neoplasm of breast: Secondary | ICD-10-CM

## 2011-12-03 NOTE — Progress Notes (Signed)
Subjective:     Patient ID: Robin Huynh, female   DOB: 08/09/1934, 77 y.o.   MRN: [id number]  HPI She is here for long-term followup status post left mastectomy in June of 2013 for multifocal DCIS of the right breast. She is doing well and has no complaints. She is on tamoxifen  Review of Systems     Objective:   Physical Exam She is well in appearance There is no right arm swelling There is no adenopathy on the right side and there is no chest wall mass    Assessment:     Patient stable status post right mastectomy for multifocal ductal carcinoma in situ    Plan:     She will continue to be seen at the cancer center. I will see her back in one year unless there are problems

## 2012-05-30 ENCOUNTER — Other Ambulatory Visit: Payer: Medicare Other | Admitting: Lab

## 2012-05-30 ENCOUNTER — Ambulatory Visit: Payer: Medicare Other | Admitting: Oncology

## 2012-06-30 LAB — HM MAMMOGRAPHY: HM Mammogram: NORMAL

## 2012-07-14 ENCOUNTER — Encounter: Payer: Self-pay | Admitting: Gastroenterology

## 2012-08-04 ENCOUNTER — Ambulatory Visit (AMBULATORY_SURGERY_CENTER): Payer: Medicare Other | Admitting: *Deleted

## 2012-08-04 VITALS — Ht 61.0 in | Wt 145.0 lb

## 2012-08-04 DIAGNOSIS — Z1211 Encounter for screening for malignant neoplasm of colon: Secondary | ICD-10-CM

## 2012-08-04 MED ORDER — MOVIPREP 100 G PO SOLR: ORAL | Status: DC

## 2012-08-04 NOTE — Progress Notes (Signed)
Patient denies any allergies to eggs or soy. Patient denies any problems with anesthesia.  

## 2012-08-06 ENCOUNTER — Encounter (INDEPENDENT_AMBULATORY_CARE_PROVIDER_SITE_OTHER): Payer: Self-pay

## 2012-08-18 ENCOUNTER — Encounter: Payer: Self-pay | Admitting: Gastroenterology

## 2012-08-18 ENCOUNTER — Ambulatory Visit (AMBULATORY_SURGERY_CENTER): Payer: Medicare Other | Admitting: Gastroenterology

## 2012-08-18 VITALS — BP 144/53 | HR 49 | Temp 97.3°F | Resp 15 | Ht 61.0 in | Wt 145.0 lb

## 2012-08-18 DIAGNOSIS — D126 Benign neoplasm of colon, unspecified: Secondary | ICD-10-CM

## 2012-08-18 DIAGNOSIS — Z1211 Encounter for screening for malignant neoplasm of colon: Secondary | ICD-10-CM

## 2012-08-18 DIAGNOSIS — K5730 Diverticulosis of large intestine without perforation or abscess without bleeding: Secondary | ICD-10-CM

## 2012-08-18 LAB — HM COLONOSCOPY

## 2012-08-18 MED ORDER — SODIUM CHLORIDE 0.9 % IV SOLN: 500.0000 mL | INTRAVENOUS | Status: DC

## 2012-08-18 NOTE — Patient Instructions (Addendum)

## 2012-08-18 NOTE — Progress Notes (Signed)
Procedure ends, to recovery, report given and VSS. 

## 2012-08-18 NOTE — Progress Notes (Signed)
Called to room to assist during endoscopic procedure.  Patient ID and intended procedure confirmed with present staff. Received instructions for my participation in the procedure from the performing physician.  

## 2012-08-18 NOTE — Progress Notes (Signed)
Patient did not experience any of the following events: a burn prior to discharge; a fall within the facility; wrong site/side/patient/procedure/implant event; or a hospital transfer or hospital admission upon discharge from the facility. (G8907) Patient did not have preoperative order for IV antibiotic SSI prophylaxis. (G8918)  

## 2012-08-18 NOTE — Op Note (Signed)
[facility] [address] [place], [address]   COLONOSCOPY PROCEDURE REPORT  PATIENT: Robin Huynh, Robin Huynh  MR#: [id number] BIRTHDATE: [birth year] , 78  yrs. old GENDER: Female ENDOSCOPIST: Mardella Layman, MD, [facility] REFERRED BY: PROCEDURE DATE:  08/18/2012 PROCEDURE:   Colonoscopy with biopsy History of Adenoma - Now for follow-up colonoscopy & has been > or = to 3 yrs.  Yes hx of adenoma.  Has been 3 or more years since last colonoscopy.  Polyps Removed Today? Yes. ASA CLASS:   Class III INDICATIONS:Patient's personal history of colon polyps. MEDICATIONS: Propofol (Diprivan) 160 mg IV  DESCRIPTION OF PROCEDURE:   After the risks benefits and alternatives of the procedure were thoroughly explained, informed consent was obtained.  A digital rectal exam revealed no abnormalities of the rectum.   The LB [id number]  endoscope was introduced through the anus and advanced to the cecum, which was identified by both the appendix and ileocecal valve. No adverse events experienced.   The quality of the prep was poor, using MoviPrep  The instrument was then slowly withdrawn as the colon was fully examined.      COLON FINDINGS: A small smooth sessile polyp was found in the sigmoid colon.  A biopsy was performed using cold forceps.   There was severe diverticulosis noted in the descending colon and sigmoid colon with associated luminal narrowing, colonic narrowing and tortuosity.  Retroflexed views revealed no abnormalities. The time to cecum=7 minutes 17 seconds.  Withdrawal time=8 minutes 00 seconds.  The scope was withdrawn and the procedure completed. COMPLICATIONS: There were no complications.  ENDOSCOPIC IMPRESSION: 1.   Small sessile polyp was found in the sigmoid colon; biopsy was performed using cold forceps 2.   There was severe diverticulosis noted in the descending colon and sigmoid colon...very hard exam per poor prep and redundant colon with  diverticulosis !!!!  RECOMMENDATIONS: 1.  Repeat colonoscopy in 5 years if polyp adenomatous; otherwise 10 years 2.  High fiber diet with liberal fluid intake.   eSigned:  Mardella Layman, MD, [facility] 08/18/2012 11:57 AM   cc:   PATIENT NAME:  [name], [name] MR#: [id number]

## 2012-08-19 ENCOUNTER — Telehealth: Payer: Self-pay | Admitting: *Deleted

## 2012-08-19 NOTE — Telephone Encounter (Signed)
  Follow up Call-  Call back number 08/18/2012  Post procedure Call Back phone  # [phone]  Permission to leave phone message Yes     Patient questions:  Do you have a fever, pain , or abdominal swelling? no Pain Score  0 *  Have you tolerated food without any problems? yes  Have you been able to return to your normal activities? yes  Do you have any questions about your discharge instructions: Diet   no Medications  no Follow up visit  no  Do you have questions or concerns about your Care? no  Actions: * If pain score is 4 or above: No action needed, pain <4.

## 2012-08-25 ENCOUNTER — Encounter: Payer: Self-pay | Admitting: Gastroenterology

## 2012-11-19 ENCOUNTER — Other Ambulatory Visit: Payer: Self-pay | Admitting: Oncology

## 2012-11-20 ENCOUNTER — Telehealth: Payer: Self-pay | Admitting: *Deleted

## 2012-11-20 NOTE — Telephone Encounter (Signed)
Pt called to r/s her ftka . gv appt for 01/12/13@ 3:30pm.pt is aware...td

## 2012-12-09 ENCOUNTER — Encounter (INDEPENDENT_AMBULATORY_CARE_PROVIDER_SITE_OTHER): Payer: Self-pay | Admitting: Surgery

## 2012-12-09 ENCOUNTER — Other Ambulatory Visit (INDEPENDENT_AMBULATORY_CARE_PROVIDER_SITE_OTHER): Payer: Self-pay | Admitting: *Deleted

## 2012-12-09 ENCOUNTER — Ambulatory Visit (INDEPENDENT_AMBULATORY_CARE_PROVIDER_SITE_OTHER): Payer: Medicare Other | Admitting: Surgery

## 2012-12-09 VITALS — BP 132/82 | HR 64 | Temp 98.8°F | Resp 14 | Ht 61.0 in | Wt 146.2 lb

## 2012-12-09 DIAGNOSIS — Z853 Personal history of malignant neoplasm of breast: Secondary | ICD-10-CM

## 2012-12-09 DIAGNOSIS — C50919 Malignant neoplasm of unspecified site of unspecified female breast: Secondary | ICD-10-CM

## 2012-12-09 MED ORDER — UNABLE TO FIND: Status: DC

## 2012-12-09 NOTE — Progress Notes (Signed)
Subjective:     Patient ID: Robin Huynh, female   DOB: Oct 31, 1934, 78 y.o.   MRN: [id number]  HPI She is here for long-term followup of ductal carcinoma in situ of the right breast. She is status post right mastectomy in June of 2013. She is on tamoxifen. She is doing very well and has no complaints. She denies arm swelling.  Review of Systems     Objective:   Physical Exam On exam, her right mastectomy site is well healed with no palpable masses. There are no palpable left breast masses. There is no axillary adenopathy on either side.    Assessment:     Long-term history of right breast ductal carcinoma in situ     Plan:     We renewed her prescription for postmastectomy bra. I will see her back in one year unless there are problems

## 2012-12-10 ENCOUNTER — Telehealth: Payer: Self-pay | Admitting: Oncology

## 2012-12-10 NOTE — Telephone Encounter (Signed)
, °

## 2013-01-10 DIAGNOSIS — R0989 Other specified symptoms and signs involving the circulatory and respiratory systems: Secondary | ICD-10-CM | POA: Insufficient documentation

## 2013-01-10 DIAGNOSIS — K5790 Diverticulosis of intestine, part unspecified, without perforation or abscess without bleeding: Secondary | ICD-10-CM | POA: Insufficient documentation

## 2013-01-10 DIAGNOSIS — E039 Hypothyroidism, unspecified: Secondary | ICD-10-CM | POA: Insufficient documentation

## 2013-01-10 DIAGNOSIS — R7309 Other abnormal glucose: Secondary | ICD-10-CM | POA: Insufficient documentation

## 2013-01-10 DIAGNOSIS — E559 Vitamin D deficiency, unspecified: Secondary | ICD-10-CM | POA: Insufficient documentation

## 2013-01-12 ENCOUNTER — Ambulatory Visit: Payer: Medicare Other | Admitting: Oncology

## 2013-01-14 ENCOUNTER — Ambulatory Visit: Payer: Self-pay | Admitting: Physician Assistant

## 2013-01-22 ENCOUNTER — Ambulatory Visit (INDEPENDENT_AMBULATORY_CARE_PROVIDER_SITE_OTHER): Payer: Medicare Other | Admitting: Physician Assistant

## 2013-01-22 ENCOUNTER — Encounter: Payer: Self-pay | Admitting: Physician Assistant

## 2013-01-22 VITALS — BP 120/60 | HR 64 | Temp 98.3°F | Resp 16 | Ht 60.5 in | Wt 146.0 lb

## 2013-01-22 DIAGNOSIS — I10 Essential (primary) hypertension: Secondary | ICD-10-CM

## 2013-01-22 DIAGNOSIS — E559 Vitamin D deficiency, unspecified: Secondary | ICD-10-CM

## 2013-01-22 DIAGNOSIS — R0989 Other specified symptoms and signs involving the circulatory and respiratory systems: Secondary | ICD-10-CM

## 2013-01-22 DIAGNOSIS — Z79899 Other long term (current) drug therapy: Secondary | ICD-10-CM

## 2013-01-22 DIAGNOSIS — R7309 Other abnormal glucose: Secondary | ICD-10-CM

## 2013-01-22 DIAGNOSIS — E039 Hypothyroidism, unspecified: Secondary | ICD-10-CM

## 2013-01-22 DIAGNOSIS — R7303 Prediabetes: Secondary | ICD-10-CM

## 2013-01-22 LAB — BASIC METABOLIC PANEL WITH GFR
BUN: 19 mg/dL (ref 6–23)
CHLORIDE: 103 meq/L (ref 96–112)
CO2: 25 meq/L (ref 19–32)
CREATININE: 0.87 mg/dL (ref 0.50–1.10)
Calcium: 9.2 mg/dL (ref 8.4–10.5)
GFR, Est African American: 75 mL/min
GFR, Est Non African American: 65 mL/min
GLUCOSE: 93 mg/dL (ref 70–99)
Potassium: 4.2 mEq/L (ref 3.5–5.3)
Sodium: 137 mEq/L (ref 135–145)

## 2013-01-22 LAB — CBC WITH DIFFERENTIAL/PLATELET
Basophils Absolute: 0 10*3/uL (ref 0.0–0.1)
Basophils Relative: 0 % (ref 0–1)
EOS PCT: 2 % (ref 0–5)
Eosinophils Absolute: 0.1 10*3/uL (ref 0.0–0.7)
HEMATOCRIT: 37.5 % (ref 36.0–46.0)
Hemoglobin: 12.6 g/dL (ref 12.0–15.0)
LYMPHS ABS: 2.2 10*3/uL (ref 0.7–4.0)
LYMPHS PCT: 35 % (ref 12–46)
MCH: 27.9 pg (ref 26.0–34.0)
MCHC: 33.6 g/dL (ref 30.0–36.0)
MCV: 83 fL (ref 78.0–100.0)
MONO ABS: 0.4 10*3/uL (ref 0.1–1.0)
Monocytes Relative: 7 % (ref 3–12)
Neutro Abs: 3.5 10*3/uL (ref 1.7–7.7)
Neutrophils Relative %: 56 % (ref 43–77)
Platelets: 164 10*3/uL (ref 150–400)
RBC: 4.52 MIL/uL (ref 3.87–5.11)
RDW: 14.7 % (ref 11.5–15.5)
WBC: 6.3 10*3/uL (ref 4.0–10.5)

## 2013-01-22 LAB — LIPID PANEL
Cholesterol: 154 mg/dL (ref 0–200)
HDL: 72 mg/dL (ref >39)
LDL CALC: 72 mg/dL (ref 0–99)
Total CHOL/HDL Ratio: 2.1 Ratio
Triglycerides: 51 mg/dL (ref <150)
VLDL: 10 mg/dL (ref 0–40)

## 2013-01-22 LAB — HEPATIC FUNCTION PANEL
ALK PHOS: 59 U/L (ref 39–117)
ALT: <8 U/L (ref 0–35)
AST: 27 U/L (ref 0–37)
Albumin: 3.7 g/dL (ref 3.5–5.2)
BILIRUBIN INDIRECT: 0.2 mg/dL (ref 0.0–0.9)
Bilirubin, Direct: 0.1 mg/dL (ref 0.0–0.3)
TOTAL PROTEIN: 6.9 g/dL (ref 6.0–8.3)
Total Bilirubin: 0.3 mg/dL (ref 0.3–1.2)

## 2013-01-22 LAB — MAGNESIUM: Magnesium: 1.7 mg/dL (ref 1.5–2.5)

## 2013-01-22 NOTE — Patient Instructions (Signed)
Complementary and Alternative Medical Therapies for Diabetes Complementary and alternative medicines are health care practices or products that are not always accepted as part of routine medicine. Complementary medicine is used along with routine medicine (medical therapy). Alternative medicine can sometimes be used instead of routine medicine. Some people use these methods to treat diabetes. While some of these therapies may be effective, others may not be. Some may even be harmful. Patients using these methods need to tell their caregiver. It is important to let your caregivers know what you are doing. Some of these therapies are discussed below. For more information, talk with your caregiver. THERAPIES Acupuncture Acupuncture is done by a professional who inserts needles into certain points on the skin. Some scientists believe that this triggers the release of the body's natural painkillers. It has been shown to relieve long-term (chronic) pain. This may help patients with painful nerve damage caused by diabetes. Biofeedback Biofeedback helps a person become more aware of the body's response to pain. It also helps you learn to deal with the pain. This alternative therapy focuses on relaxation and stress-reduction techniques. Thinking of peaceful mental images (guided imagery) is one technique. Some people believe these images can ease their condition. MEDICATIONS Chromium Several studies report that chromium supplements may improve diabetes control. Chromium helps insulin improve its action. Research is not yet certain. Supplements have not been recommended or approved. Caution is needed if you have kidney (renal) problems. Ginseng There are several types of ginseng plants. American ginseng is used for diabetes studies. Those studies have shown some glucose-lowering effects. Those effects have been seen with fasting and after-meal blood glucose levels. They have also been seen in A1c levels (average  blood glucose levels over a 54-month period). More long-term studies are needed before recommendations for use of ginseng can be made. Magnesium Experts have studied the relationship between magnesium and diabetes for many years. But it is not yet fully understood. Studies suggest that a low amount of magnesium may make blood glucose control worse in type 2 diabetes. Research also shows that a low amount may contribute to certain diabetes complications. One study showed that people who consume more magnesium had less risk of type 2 diabetes. Eating whole grains, nuts, and green leafy vegetables raises the magnesium level. Vanadium Vanadium is a compound found in tiny amounts in plants and animals. Early studies showed that vanadium improved blood glucose levels in animals with type 1 and type 2 diabetes. One study found that when given vanadium, those with diabetes were able to decrease their insulin dosage. Researchers still need to learn how it works in the body to discover any side effects, and to find safe dosages. Cinnamon There have been a couple of studies that seem to indicate cinnamon decreases insulin resistance and increases insulin production. By doing so, it may lower blood glucose. Exact doses are unknown, but it may work best when used in combination with other diabetes medicines. Document Released: 10/22/2006 Document Revised: 03/19/2011 Document Reviewed: 11/04/2008 [facility] Patient Information 2014 [place].    Bad carbs also include fruit juice, alcohol, and sweet tea. These are empty calories that do not signal to your brain that you are full.   Please remember the good carbs are still carbs which convert into sugar. So please measure them out no more than 1/2-1 cup of rice, oatmeal, pasta, and beans.  Veggies are however free foods! Pile them on.   I like lean protein at every meal such as chicken,  [place], pork chops, cottage cheese, etc. Just do not fry these meats and  please center your meal around vegetable, the meats should be a side dish.   No all fruit is created equal. Please see the list below, the fruit at the bottom is higher in sugars than the fruit at the top

## 2013-01-22 NOTE — Progress Notes (Signed)
HPI Patient presents for 3 month follow up with hypertension, hyperlipidemia, prediabetes and vitamin D. Patient's blood pressure has been controlled at home, today their BP is BP: 120/60 mmHg  Patient denies chest pain, shortness of breath, dizziness.  Patient's cholesterol is diet controlled. In addition they are on  and denies myalgias. The cholesterol last visit was LDL 80, HDL 71.  The patient has been working on diet and exercise for prediabetes, and denies changes in vision, polys, and paresthesias. A1C 6.3 (5.8).  Patient is on Vitamin D supplement.   Her platelets are 148, her GFR was 73 last visit.  Current Medications:  Current Outpatient Prescriptions on File Prior to Visit  Medication Sig Dispense Refill  . aspirin 81 MG tablet Take 81 mg by mouth daily.      Marland Kitchen CALCIUM PO Take 1 tablet by mouth daily.      . Cholecalciferol (VITAMIN D) 2000 UNITS tablet Take 2,000 Units by mouth 3 (three) times daily.      . fish oil-omega-3 fatty acids 1000 MG capsule Take 2 g by mouth daily.      Marland Kitchen levothyroxine (SYNTHROID, LEVOTHROID) 100 MCG tablet Take 50-100 mcg by mouth daily. Take 100 mcg on Mon wed and fri and 1/2 tablet the rest of the week      . magnesium 30 MG tablet Take 30 mg by mouth 2 (two) times daily.      . Multiple Vitamin (MULTIVITAMIN) tablet Take 1 tablet by mouth daily.      . Red Yeast Rice 600 MG CAPS Take 600 mg by mouth daily.       . tamoxifen (NOLVADEX) 20 MG tablet TAKE ONE TABLET BY MOUTH EVERY DAY  90 tablet  0  . UNABLE TO FIND Rx: L8000- Post Surgical Bras (Quantity: 6) Dx: 174.9; Right mastectomy  1 each  0  . vitamin C (ASCORBIC ACID) 500 MG tablet Take 500 mg by mouth daily.       No current facility-administered medications on file prior to visit.   Medical History:  Past Medical History  Diagnosis Date  . Breast cancer 08/2011    right  . Labile hypertension   . Hypothyroidism   . Prediabetes   . Vitamin D deficiency   . Diverticulosis     Allergies: No Known Allergies  ROS Constitutional: Denies fever, chills, headaches, insomnia, fatigue, night sweats Eyes: Denies redness, blurred vision, diplopia, discharge, itchy, watery eyes.  ENT: Denies congestion, post nasal drip, sore throat, earache, dental pain, Tinnitus, Vertigo, Sinus pain, snoring.  Cardio: Denies chest pain, palpitations, irregular heartbeat, dyspnea, diaphoresis, orthopnea, PND, claudication, edema Respiratory: denies cough, shortness of breath, wheezing.  Gastrointestinal: Denies dysphagia, heartburn, AB pain/ cramps, N/V, diarrhea, constipation, hematemesis, melena, hematochezia,  hemorrhoids Genitourinary: Denies dysuria, frequency, urgency, nocturia, hesitancy, discharge, hematuria, flank pain Musculoskeletal: Denies myalgia, stiffness, pain, swelling and strain/sprain. Skin: Denies pruritis, rash, changing in skin lesion Neuro: Denies Weakness, tremor, incoordination, spasms, pain Psychiatric: Denies confusion, memory loss, sensory loss Endocrine: Denies change in weight, skin, hair change, nocturia Diabetic Polys, Denies visual blurring, hyper /hypo glycemic episodes, and paresthesia, Heme/Lymph: Denies Excessive bleeding, bruising, enlarged lymph nodes  Family history- Review and unchanged Social history- Review and unchanged Physical Exam: Filed Vitals:   01/22/13 1150  BP: 120/60  Pulse: 64  Temp: 98.3 F (36.8 C)  Resp: 16   Filed Weights   01/22/13 1150  Weight: 146 lb (66.225 kg)   General Appearance: Well nourished, in no  apparent distress. Eyes: PERRLA, EOMs, conjunctiva no swelling or erythema Sinuses: No Frontal/maxillary tenderness ENT/Mouth: Ext aud canals clear, TMs without erythema, bulging. No erythema, swelling, or exudate on post pharynx.  Tonsils not swollen or erythematous. Hearing normal.  Neck: Supple, thyroid normal.  Respiratory: Respiratory effort normal, BS equal bilaterally without rales, rhonchi, wheezing or  stridor.  Cardio: RRR with no MRGs. Brisk peripheral pulses without edema.  Abdomen: Soft, + BS.  Non tender, no guarding, rebound, hernias, masses. Lymphatics: Non tender without lymphadenopathy.  Musculoskeletal: Full ROM, 5/5 strength, normal gait.  Skin: Warm, dry without rashes, lesions, ecchymosis.  Neuro: Cranial nerves intact. Normal muscle tone, no cerebellar symptoms. Sensation intact.  Psych: Awake and oriented X 3, normal affect, Insight and Judgment appropriate.   Assessment and Plan:  Hypertension: Continue medication, monitor blood pressure at home. Continue DASH diet. Cholesterol: Continue diet and exercise. Check cholesterol.  Pre-diabetes-Continue diet and exercise. Check A1C Vitamin D Def- check level and continue medications.  Thrombocytopenia- check CBC CRI stage I- check GFR, drink plenty of water.   Continue diet and meds as discussed. Further disposition pending results of labs.  Vicie Mutters 12:14 PM

## 2013-01-23 LAB — TSH: TSH: 1.018 u[IU]/mL (ref 0.350–4.500)

## 2013-01-23 LAB — HEMOGLOBIN A1C
Hgb A1c MFr Bld: 6.1 % — ABNORMAL HIGH (ref <5.7)
Mean Plasma Glucose: 128 mg/dL — ABNORMAL HIGH (ref <117)

## 2013-01-27 ENCOUNTER — Encounter: Payer: Self-pay | Admitting: Oncology

## 2013-01-27 ENCOUNTER — Telehealth: Payer: Self-pay | Admitting: Oncology

## 2013-01-27 ENCOUNTER — Ambulatory Visit (HOSPITAL_BASED_OUTPATIENT_CLINIC_OR_DEPARTMENT_OTHER): Payer: Medicare Other | Admitting: Oncology

## 2013-01-27 VITALS — BP 147/73 | HR 65 | Temp 97.6°F | Resp 18 | Ht 60.5 in | Wt 147.0 lb

## 2013-01-27 DIAGNOSIS — E559 Vitamin D deficiency, unspecified: Secondary | ICD-10-CM

## 2013-01-27 DIAGNOSIS — Z170 Estrogen receptor positive status [ER+]: Secondary | ICD-10-CM

## 2013-01-27 DIAGNOSIS — D0590 Unspecified type of carcinoma in situ of unspecified breast: Secondary | ICD-10-CM

## 2013-01-27 DIAGNOSIS — C50519 Malignant neoplasm of lower-outer quadrant of unspecified female breast: Secondary | ICD-10-CM

## 2013-01-27 MED ORDER — TAMOXIFEN CITRATE 20 MG PO TABS: 20.0000 mg | ORAL_TABLET | Freq: Every day | ORAL | Status: DC

## 2013-01-27 NOTE — Progress Notes (Signed)
OFFICE PROGRESS NOTE  CC  MCKEOWN,WILLIAM DAVID, MD [address] [place] [address] Dr. Coralie Keens  DIAGNOSIS: 79 year old female with intermediate grade ductal carcinoma in situ diagnosed 06/19/2011. Patient is status post right mastectomy.  PRIOR THERAPY:  #1 patient was originally seen in the multidisciplinary breast clinic with new diagnosis of intermediate grade ductal carcinoma in situ that was ER +100% PR +76%.  #2 patient has gone on to have a right mastectomy performed on 08/13/2011. Her final pathology revealed 2 foci of DCIS measuring 1.2 and 1.1 cm all surgical margins were negative one sentinel node was negative for metastatic disease.  #3 patient will start chemoprevention with tamoxifen 20 mg daily For the contralateral breast.  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  Otherwise she denies any fevers chills night sweats aches pains nausea vomiting no myalgias or arthralgias. Overall she feels well. Remainder of the 10 point review of systems is negative.  MEDICAL HISTORY: Past Medical History  Diagnosis Date  . Breast cancer 08/2011    right  . Labile hypertension   . Hypothyroidism   . Prediabetes   . Vitamin D deficiency   . Diverticulosis     ALLERGIES:  has No Known Allergies.  MEDICATIONS:  Current Outpatient Prescriptions  Medication Sig Dispense Refill  . aspirin 81 MG tablet Take 81 mg by mouth daily.      Marland Kitchen CALCIUM PO Take 1 tablet by mouth daily.      . Cholecalciferol (VITAMIN D) 2000 UNITS tablet Take 2,000 Units by mouth 3 (three) times daily.      . fish oil-omega-3 fatty acids 1000 MG capsule Take 2 g by mouth daily.      Marland Kitchen levothyroxine (SYNTHROID, LEVOTHROID) 100 MCG tablet Take 50-100 mcg by mouth daily. Take 100 mcg on Mon wed and fri and 1/2 tablet the rest of the week      . magnesium  30 MG tablet Take 30 mg by mouth 2 (two) times daily.      . Multiple Vitamin (MULTIVITAMIN) tablet Take 1 tablet by mouth daily.      . Red Yeast Rice 600 MG CAPS Take 600 mg by mouth daily.       . tamoxifen (NOLVADEX) 20 MG tablet TAKE ONE TABLET BY MOUTH EVERY DAY  90 tablet  0  . UNABLE TO FIND Rx: L8000- Post Surgical Bras (Quantity: 6) Dx: 174.9; Right mastectomy  1 each  0  . vitamin C (ASCORBIC ACID) 500 MG tablet Take 500 mg by mouth daily.       No current facility-administered medications for this visit.    SURGICAL HISTORY:  Past Surgical History  Procedure Laterality Date  . Breast surgery      right breast  . Colonoscopy    . Mastectomy complete / simple w/ sentinel node biopsy  08/10/11    right  . Mastectomy w/ sentinel node biopsy  08/10/2011    Procedure: MASTECTOMY WITH SENTINEL LYMPH NODE BIOPSY;  Surgeon: Harl Bowie, MD;  Location: [facility];  Service: General;  Laterality: Right;    REVIEW OF SYSTEMS:  Pertinent items are noted in HPI.   PHYSICAL EXAMINATION: General appearance: alert, cooperative and appears stated age Resp: clear to auscultation bilaterally Back: symmetric, no curvature. ROM normal. No CVA tenderness. Cardio: regularly irregular rhythm GI: soft, non-tender; bowel sounds normal;  no masses,  no organomegaly Extremities: extremities normal, atraumatic, no cyanosis or edema Neurologic: Grossly normal Right incisional scar looks well healed there is seroma fluid collection noted. Left breast no masses or nipple discharge  ECOG PERFORMANCE STATUS: 0 - Asymptomatic  Blood pressure 147/73, pulse 65, temperature 97.6 F (36.4 C), temperature source Oral, resp. rate 18, height 5' 0.5" (1.537 m), weight 147 lb (66.679 kg).  LABORATORY DATA: Lab Results  Component Value Date   WBC 6.3 01/22/2013   HGB 12.6 01/22/2013   HCT 37.5 01/22/2013   MCV 83.0 01/22/2013   PLT 164 01/22/2013      Chemistry      Component Value Date/Time   NA 137  01/22/2013 1224   NA 139 11/26/2011 1129   K 4.2 01/22/2013 1224   K 4.0 11/26/2011 1129   CL 103 01/22/2013 1224   CL 106 11/26/2011 1129   CO2 25 01/22/2013 1224   CO2 28 11/26/2011 1129   BUN 19 01/22/2013 1224   BUN 18.0 11/26/2011 1129   CREATININE 0.87 01/22/2013 1224   CREATININE 0.9 11/26/2011 1129   CREATININE 0.90 08/11/2011 0641      Component Value Date/Time   CALCIUM 9.2 01/22/2013 1224   CALCIUM 9.6 11/26/2011 1129   ALKPHOS 59 01/22/2013 1224   ALKPHOS 62 11/26/2011 1129   AST 27 01/22/2013 1224   AST 28 11/26/2011 1129   ALT <8 01/22/2013 1224   ALT 11 11/26/2011 1129   BILITOT 0.3 01/22/2013 1224   BILITOT 0.33 11/26/2011 1129    FINAL DIAGNOSIS Diagnosis 1. Breast, simple mastectomy, right - DUCTAL CARCINOMA IN SITU WITH CALCIFICATIONS, LOW GRADE, 2 FOCI SPANNING 1.2 AND 1.1 CM. - THE SURGICAL RESECTION MARGINS ARE NEGATIVE FOR DUCTAL CARCINOMA. - THERE IS NO EVIDENCE OF CARCINOMA IN 1 OF 1 LYMPH NODE (0/1). - SEE ONCOLOGY TABLE BELOW. 2. Lymph node, sentinel, biopsy, Right axillary - THERE IS NO EVIDENCE OF CARCINOMA IN 1 OF 1 LYMPH NODE (0/1). Microscopic Comment 1. BREAST, IN SITU CARCINOMA Specimen, including laterality: Right breast. Procedure: Simple mastectomy. Grade of carcinoma: Low grade. Necrosis: Not identified. Estimated tumor size: (gross measurements): 1.2 and 1.1 cm. Treatment effect: N/A. Distance to closest margin: 1.1 cm to the deep margin (gross measurement). Breast prognostic profile: [phone]. Estrogen receptor: 100%, moderate staining intensity. Progesterone receptor: 76%, moderate staining intensity. Lymph nodes: Examined: 2. Lymph nodes with metastasis: 0. TNM: pTis, pN0. Comments: There are two foci of similar-appearing low grade ductal carcinoma in situ. The focus in the lower lateral quadrant spans 1.1 cm and the focus in the lower central breast spans 1.2 cm. (JBK:eps 08/13/11) Enid Cutter MD Pathologist, Electronic  Signature (Case signed 08  RADIOGRAPHIC STUDIES:   ASSESSMENT: 79 year old female with  #1 low-grade ductal carcinoma in situ of the right breast that was ER positive PR positive. Patient is status post mastectomy with sentinel lymph node biopsy that revealed 2 foci of disease in the lower lateral quadrant spanning 1.1 and the second lower central breast spanning 1.2 cm. Postoperatively patient is doing well her sentinel node was negative.  #2 patient is currently on tamoxifen. She is tolerating it well without any significant problems. She has no evidence of recurrent disease.  #3 vitamin D deficiency: Patient is seeking vitamin D 2000 units 3 times a day. She is followed by her primary care physician for this.  PLAN:  #1 the patient is tolerating tamoxifen well without any problems except for some hot flashes. Prescription for tamoxifen  was sent to her pharmacy  #2 she will be seen back in 12 months time  All questions were answered. The patient knows to call the clinic with any problems, questions or concerns. We can certainly see the patient much sooner if necessary.  I spent 25 minutes counseling the patient face to face. The total time spent in the appointment was 30 minutes.    Marcy Panning, MD Medical/Oncology [facility] [phone] (beeper) [phone] (Office)  01/27/2013, 12:39 PM

## 2013-04-14 ENCOUNTER — Ambulatory Visit: Payer: Self-pay | Admitting: Emergency Medicine

## 2013-04-15 ENCOUNTER — Telehealth: Payer: Self-pay | Admitting: *Deleted

## 2013-04-15 NOTE — Telephone Encounter (Signed)
Patient called and states she is having allergies and cough.  Per Dr Melford Aase, try OTC Claritin or zyrtec.  Left message to inform patient.

## 2013-04-16 ENCOUNTER — Encounter: Payer: Self-pay | Admitting: Physician Assistant

## 2013-04-16 ENCOUNTER — Ambulatory Visit (INDEPENDENT_AMBULATORY_CARE_PROVIDER_SITE_OTHER): Payer: Medicare Other | Admitting: Physician Assistant

## 2013-04-16 ENCOUNTER — Telehealth: Payer: Self-pay

## 2013-04-16 VITALS — BP 120/70 | HR 72 | Temp 100.4°F | Resp 16 | Ht 60.5 in | Wt 149.0 lb

## 2013-04-16 DIAGNOSIS — Z1331 Encounter for screening for depression: Secondary | ICD-10-CM

## 2013-04-16 DIAGNOSIS — Z0000 Encounter for general adult medical examination without abnormal findings: Secondary | ICD-10-CM

## 2013-04-16 DIAGNOSIS — J209 Acute bronchitis, unspecified: Secondary | ICD-10-CM

## 2013-04-16 MED ORDER — AZITHROMYCIN 250 MG PO TABS: ORAL_TABLET | ORAL | Status: DC

## 2013-04-16 NOTE — Telephone Encounter (Signed)
Pt's daughter called and said pt not feeling well. Says pt's breathing sounds "funny." She has Medicare and has to see [facility]. No appts until 04-20-13. Please advise.

## 2013-04-16 NOTE — Telephone Encounter (Signed)
Just put her with me or [facility] and we will have to get [facility] to peek in on her. If she weak like dehydrated or pneumonia may be better to go to ER.

## 2013-04-16 NOTE — Patient Instructions (Addendum)
Cough, Adult  A cough is a reflex that helps clear your throat and airways. It can help heal the body or may be a reaction to an irritated airway. A cough may only last 2 or 3 weeks (acute) or may last more than 8 weeks (chronic).  CAUSES Acute cough:  Viral or bacterial infections. Chronic cough:  Infections.  Allergies.  Asthma.  Post-nasal drip.  Smoking.  Heartburn or acid reflux.  Some medicines.  Chronic lung problems (COPD).  Cancer. SYMPTOMS   Cough.  Fever.  Chest pain.  Increased breathing rate.  High-pitched whistling sound when breathing (wheezing).  Colored mucus that you cough up (sputum). TREATMENT   A bacterial cough may be treated with antibiotic medicine.  A viral cough must run its course and will not respond to antibiotics.  Your caregiver may recommend other treatments if you have a chronic cough. HOME CARE INSTRUCTIONS   Only take over-the-counter or prescription medicines for pain, discomfort, or fever as directed by your caregiver. Use cough suppressants only as directed by your caregiver.  Use a cold steam vaporizer or humidifier in your bedroom or home to help loosen secretions.  Sleep in a semi-upright position if your cough is worse at night.  Rest as needed.  Stop smoking if you smoke. SEEK IMMEDIATE MEDICAL CARE IF:   You have pus in your sputum.  Your cough starts to worsen.  You cannot control your cough with suppressants and are losing sleep.  You begin coughing up blood.  You have difficulty breathing.  You develop pain which is getting worse or is uncontrolled with medicine.  You have a fever. MAKE SURE YOU:   Understand these instructions.  Will watch your condition.  Will get help right away if you are not doing well or get worse. Document Released: 06/23/2010 Document Revised: 03/19/2011 Document Reviewed: 06/23/2010 [facility] Patient Information 2014 [place].  Preventative Care for Adults -  Female      MAINTAIN REGULAR HEALTH EXAMS:  A routine yearly physical is a good way to check in with your primary care provider about your health and preventive screening. It is also an opportunity to share updates about your health and any concerns you have, and receive a thorough all-over exam.   Most health insurance companies pay for at least some preventative services.  Check with your health plan for specific coverages.  WHAT PREVENTATIVE SERVICES DO WOMEN NEED?  Adult women should have their weight and blood pressure checked regularly.   Women age 86 and older should have their cholesterol levels checked regularly.  Women should be screened for cervical cancer with a Pap smear and pelvic exam beginning at either age 96, or 3 years after they become sexually activity.    Breast cancer screening generally begins at age 70 with a mammogram and breast exam by your primary care provider.    Beginning at age 54 and continuing to age 74, women should be screened for colorectal cancer.  Certain people may need continued testing until age 54.  Updating vaccinations is part of preventative care.  Vaccinations help protect against diseases such as the flu.  Osteoporosis is a disease in which the bones lose minerals and strength as we age. Women ages 55 and over should discuss this with their caregivers, as should women after menopause who have other risk factors.  Lab tests are generally done as part of preventative care to screen for anemia and blood disorders, to screen for problems with the  kidneys and liver, to screen for bladder problems, to check blood sugar, and to check your cholesterol level.  Preventative services generally include counseling about diet, exercise, avoiding tobacco, drugs, excessive alcohol consumption, and sexually transmitted infections.    GENERAL RECOMMENDATIONS FOR GOOD HEALTH:  Healthy diet:  Eat a variety of foods, including fruit, vegetables, animal or  vegetable protein, such as meat, fish, chicken, and eggs, or beans, lentils, tofu, and grains, such as rice.  Drink plenty of water daily.  Decrease saturated fat in the diet, avoid lots of red meat, processed foods, sweets, fast foods, and fried foods.  Exercise:  Aerobic exercise helps maintain good heart health. At least 30-40 minutes of moderate-intensity exercise is recommended. For example, a brisk walk that increases your heart rate and breathing. This should be done on most days of the week.   Find a type of exercise or a variety of exercises that you enjoy so that it becomes a part of your daily life.  Examples are running, walking, swimming, water aerobics, and biking.  For motivation and support, explore group exercise such as aerobic class, spin class, Zumba, Yoga,or  martial arts, etc.    Set exercise goals for yourself, such as a certain weight goal, walk or run in a race such as a 5k walk/run.  Speak to your primary care provider about exercise goals.  Disease prevention:  If you smoke or chew tobacco, find out from your caregiver how to quit. It can literally save your life, no matter how long you have been a tobacco user. If you do not use tobacco, never begin.   Maintain a healthy diet and normal weight. Increased weight leads to problems with blood pressure and diabetes.   The Body Mass Index or BMI is a way of measuring how much of your body is fat. Having a BMI above 27 increases the risk of heart disease, diabetes, hypertension, stroke and other problems related to obesity. Your caregiver can help determine your BMI and based on it develop an exercise and dietary program to help you achieve or maintain this important measurement at a healthful level.  High blood pressure causes heart and blood vessel problems.  Persistent high blood pressure should be treated with medicine if weight loss and exercise do not work.   Fat and cholesterol leaves deposits in your arteries  that can block them. This causes heart disease and vessel disease elsewhere in your body.  If your cholesterol is found to be high, or if you have heart disease or certain other medical conditions, then you may need to have your cholesterol monitored frequently and be treated with medication.   Ask if you should have a cardiac stress test if your history suggests this. A stress test is a test done on a treadmill that looks for heart disease. This test can find disease prior to there being a problem.  Menopause can be associated with physical symptoms and risks. Hormone replacement therapy is available to decrease these. You should talk to your caregiver about whether starting or continuing to take hormones is right for you.   Osteoporosis is a disease in which the bones lose minerals and strength as we age. This can result in serious bone fractures. Risk of osteoporosis can be identified using a bone density scan. Women ages 72 and over should discuss this with their caregivers, as should women after menopause who have other risk factors. Ask your caregiver whether you should be taking a calcium supplement  and Vitamin D, to reduce the rate of osteoporosis.   Avoid drinking alcohol in excess (more than two drinks per day).  Avoid use of street drugs. Do not share needles with anyone. Ask for professional help if you need assistance or instructions on stopping the use of alcohol, cigarettes, and/or drugs.  Brush your teeth twice a day with fluoride toothpaste, and floss once a day. Good oral hygiene prevents tooth decay and gum disease. The problems can be painful, unattractive, and can cause other health problems. Visit your dentist for a routine oral and dental check up and preventive care every 6-12 months.   Look at your skin regularly.  Use a mirror to look at your back. Notify your caregivers of changes in moles, especially if there are changes in shapes, colors, a size larger than a pencil eraser,  an irregular border, or development of new moles.  Safety:  Use seatbelts 100% of the time, whether driving or as a passenger.  Use safety devices such as hearing protection if you work in environments with loud noise or significant background noise.  Use safety glasses when doing any work that could send debris in to the eyes.  Use a helmet if you ride a bike or motorcycle.  Use appropriate safety gear for contact sports.  Talk to your caregiver about gun safety.  Use sunscreen with a SPF (or skin protection factor) of 15 or greater.  Lighter skinned people are at a greater risk of skin cancer. Don't forget to also wear sunglasses in order to protect your eyes from too much damaging sunlight. Damaging sunlight can accelerate cataract formation.   Practice safe sex. Use condoms. Condoms are used for birth control and to help reduce the spread of sexually transmitted infections (or STIs).  Some of the STIs are gonorrhea (the clap), chlamydia, syphilis, trichomonas, herpes, HPV (human papilloma virus) and HIV (human immunodeficiency virus) which causes AIDS. The herpes, HIV and HPV are viral illnesses that have no cure. These can result in disability, cancer and death.   Keep carbon monoxide and smoke detectors in your home functioning at all times. Change the batteries every 6 months or use a model that plugs into the wall.   Vaccinations:  Stay up to date with your tetanus shots and other required immunizations. You should have a booster for tetanus every 10 years. Be sure to get your flu shot every year, since 5%-20% of the [place] population comes down with the flu. The flu vaccine changes each year, so being vaccinated once is not enough. Get your shot in the fall, before the flu season peaks.   Other vaccines to consider:  Human Papilloma Virus or HPV causes cancer of the cervix, and other infections that can be transmitted from person to person. There is a vaccine for HPV, and females should get  immunized between the ages of 13 and 62. It requires a series of 3 shots.   Pneumococcal vaccine to protect against certain types of pneumonia.  This is normally recommended for adults age 22 or older.  However, adults younger than 77 years old with certain underlying conditions such as diabetes, heart or lung disease should also receive the vaccine.  Shingles vaccine to protect against Varicella Zoster if you are older than age 69, or younger than 77 years old with certain underlying illness.  Hepatitis A vaccine to protect against a form of infection of the liver by a virus acquired from food.  Hepatitis B vaccine to  protect against a form of infection of the liver by a virus acquired from blood or body fluids, particularly if you work in health care.  If you plan to travel internationally, check with your local health department for specific vaccination recommendations.  Cancer Screening:  Breast cancer screening is essential to preventive care for women. All women age 52 and older should perform a breast self-exam every month. At age 84 and older, women should have their caregiver complete a breast exam each year. Women at ages 46 and older should have a mammogram (x-ray film) of the breasts. Your caregiver can discuss how often you need mammograms.    Cervical cancer screening includes taking a Pap smear (sample of cells examined under a microscope) from the cervix (end of the uterus). It also includes testing for HPV (Human Papilloma Virus, which can cause cervical cancer). Screening and a pelvic exam should begin at age 62, or 3 years after a woman becomes sexually active. Screening should occur every year, with a Pap smear but no HPV testing, up to age 73. After age 23, you should have a Pap smear every 3 years with HPV testing, if no HPV was found previously.   Most routine colon cancer screening begins at the age of 51. On a yearly basis, doctors may provide special easy to use take-home  tests to check for hidden blood in the stool. Sigmoidoscopy or colonoscopy can detect the earliest forms of colon cancer and is life saving. These tests use a small camera at the end of a tube to directly examine the colon. Speak to your caregiver about this at age 29, when routine screening begins (and is repeated every 5 years unless early forms of pre-cancerous polyps or small growths are found).

## 2013-04-16 NOTE — Progress Notes (Addendum)
Subjective:   Robin Huynh is a 79 y.o. female who presents for Medicare Annual Wellness Visit and sick visit. Date of last medicare wellness visit is unknown.   Her blood pressure has been controlled at home, today their BP is BP: 120/70 mmHg She states she has allergies, she had an issue last month with clear congestion, cough. This most recent has been 2-3 days, she has had fever, chills, chest tightness, sinus congestion, non productive cough.   Names of Other Physician/Practitioners you currently use: 1. [facility]- here for primary care 2. Dr. Einar Gip eye doctor, last visit 12/2012 3. Unknown, dentist, last visit 6 months 4. Dr. Chancy Milroy 5. Dr. Sharlett Iles Patient Care Team: [name], MD as PCP - General (Internal Medicine)  Medication Review Current Outpatient Prescriptions on File Prior to Visit  Medication Sig Dispense Refill  . aspirin 81 MG tablet Take 81 mg by mouth daily.      Marland Kitchen CALCIUM PO Take 1 tablet by mouth daily.      . Cholecalciferol (VITAMIN D) 2000 UNITS tablet Take 2,000 Units by mouth 3 (three) times daily.      . fish oil-omega-3 fatty acids 1000 MG capsule Take 2 g by mouth daily.      Marland Kitchen levothyroxine (SYNTHROID, LEVOTHROID) 100 MCG tablet Take 50-100 mcg by mouth daily. Take 100 mcg on Mon wed and fri and 1/2 tablet the rest of the week      . magnesium 30 MG tablet Take 30 mg by mouth 2 (two) times daily.      . Multiple Vitamin (MULTIVITAMIN) tablet Take 1 tablet by mouth daily.      . Red Yeast Rice 600 MG CAPS Take 600 mg by mouth daily.       . tamoxifen (NOLVADEX) 20 MG tablet Take 1 tablet (20 mg total) by mouth daily.  90 tablet  10  . UNABLE TO FIND Rx: L8000- Post Surgical Bras (Quantity: 6) Dx: 174.9; Right mastectomy  1 each  0  . vitamin C (ASCORBIC ACID) 500 MG tablet Take 500 mg by mouth daily.       No current facility-administered medications on file prior to visit.    Current Problems  (verified) Patient Active Problem List   Diagnosis Date Noted  . Labile hypertension   . Hypothyroidism   . Prediabetes   . Vitamin D deficiency   . Diverticulosis   . Cancer of lower-outer quadrant of female breast 06/21/2011    Screening Tests Health Maintenance  Topic Date Due  . Zostavax  04/29/1996  . Influenza Vaccine  08/08/2013  . Tetanus/tdap  05/19/2018  . Colonoscopy  08/19/2022  . Pneumococcal Polysaccharide Vaccine Age 77 And Over  Completed     Immunization History  Administered Date(s) Administered  . Pneumococcal Polysaccharide-23 01/09/2007  . Td 05/18/2008    Preventative care: Last colonoscopy: 2014 Last mammogram: 06/2012 Last pap smear/pelvic exam: 2012 DEXA: 2007 DUE  Prior vaccinations: TD or Tdap: 2009  Influenza: declines Pneumococcal: 2009 Shingles/Zostavax: 2010  History reviewed: allergies, current medications, past family history, past medical history, past social history, past surgical history and problem list  Risk Factors: Osteoporosis: postmenopausal estrogen deficiency and dietary calcium and/or vitamin D deficiency History of fracture in the past year: no  Tobacco History  Substance Use Topics  . Smoking status: Never Smoker   . Smokeless tobacco: Never Used  . Alcohol Use: No   She does not smoke.  Patient is  not a former smoker. Are there smokers in your home (other than you)?  No  Alcohol Current alcohol use: none  Caffeine Current caffeine use: coffee 1-2 /day  Exercise Exercise limitations: The patient has no exercise limitations. Current exercise: gardening and walking  Nutrition/Diet Current diet: in general, a "healthy" diet    Cardiac risk factors: advanced age (older than 87 for men, 29 for women), dyslipidemia, family history of premature cardiovascular disease, hypertension and sedentary lifestyle.  Depression Screen (Note: if answer to either of the following is "Yes", a more complete depression  screening is indicated)   Q1: Over the past two weeks, have you felt down, depressed or hopeless? No  Q2: Over the past two weeks, have you felt little interest or pleasure in doing things? No  Have you lost interest or pleasure in daily life? No  Do you often feel hopeless? No  Do you cry easily over simple problems? No  Activities of Daily Living In your present state of health, do you have any difficulty performing the following activities?:  Driving? No Managing money?  No Feeding yourself? No Getting from bed to chair? No Climbing a flight of stairs? No Preparing food and eating?: No Bathing or showering? No Getting dressed: No Getting to the toilet? No Using the toilet:No Moving around from place to place: No In the past year have you fallen or had a near fall?:No   Are you sexually active?  No  Do you have more than one partner?  No  Vision Difficulties: No  Hearing Difficulties: No Do you often ask people to speak up or repeat themselves? No Do you experience ringing or noises in your ears? No Do you have difficulty understanding soft or whispered voices? No  Cognition  Do you feel that you have a problem with memory?No  Do you often misplace items? No  Do you feel safe at home?  Yes  Advanced directives Does patient have a Walker? Yes Does patient have a Living Will? Yes    Objective:     Vision and hearing screens reviewed.   Blood pressure 120/70, pulse 72, temperature 100.4 F (38 C), resp. rate 16, height 5' 0.5" (1.537 m), weight 149 lb (67.586 kg), SpO2 100.00%. Body mass index is 28.61 kg/(m^2).  General appearance: alert, no distress, WD/WN,  female Cognitive Testing  Alert? Yes  Normal Appearance?Yes  Oriented to person? Yes  Place? Yes   Time? Yes  Recall of three objects?  Yes  Can perform simple calculations? Yes  Displays appropriate judgment?Yes  Can read the correct time from a watch face?Yes  HEENT:  normocephalic, sclerae anicteric, TMs pearly, nares patent, no discharge or erythema, pharynx normal Oral cavity: MMM, no lesions Neck: supple, no lymphadenopathy, no thyromegaly, no masses Heart: RRR, normal S1, S2, no murmurs Lungs: CTA bilaterally, no wheezes, rhonchi, or rales Abdomen: +bs, soft, non tender, non distended, no masses, no hepatomegaly, no splenomegaly Musculoskeletal: nontender, no swelling, no obvious deformity Extremities: no edema, no cyanosis, no clubbing Pulses: 2+ symmetric, upper and lower extremities, normal cap refill Neurological: alert, oriented x 3, CN2-12 intact, strength normal upper extremities and lower extremities, sensation normal throughout, DTRs 2+ throughout, no cerebellar signs, gait normal Psychiatric: normal affect, behavior normal, pleasant  Breast: defer Gyn: defer Rectal: defer   Assessment:   Acute bronchitis - Plan: DG Chest 2 View Zpak 1 RF  Plan:   During the course of the visit the patient was  educated and counseled about appropriate screening and preventive services including:    Pneumococcal vaccine   Influenza vaccine  Td vaccine  Screening electrocardiogram  Screening mammography  Bone densitometry screening  Colorectal cancer screening  Diabetes screening  Glaucoma screening  Nutrition counseling   Screening recommendations, referrals:  Vaccinations: Tdap vaccine not indicated Influenza vaccine declined Pneumococcal vaccine not indicated Shingles vaccine not indicated Hep B vaccine not indicated  Nutrition assessed and recommended  Colonoscopy not indicated Mammogram gets yearly Pap smear not indicated Pelvic exam not indicated Recommended yearly ophthalmology/optometry visit for glaucoma screening and checkup Recommended yearly dental visit for hygiene and checkup Advanced directives - not indicated  Conditions/risks identified: BMI: Discussed weight loss, diet, and increase physical activity.   Increase physical activity: AHA recommends 150 minutes of physical activity a week.  Medications reviewed DEXA- requested Diabetes is at goal, ACE/ARB therapy: Yes. Urinary Incontinence is not an issue: discussed non pharmacology and pharmacology options.  Fall risk: low- discussed PT, home fall assessment, medications.   Medicare Attestation I have personally reviewed: The patient's medical and social history Their use of alcohol, tobacco or illicit drugs Their current medications and supplements The patient's functional ability including ADLs,fall risks, home safety risks, cognitive, and hearing and visual impairment Diet and physical activities Evidence for depression or mood disorders  The patient's weight, height, BMI, and visual acuity have been recorded in the chart.  I have made referrals, counseling, and provided education to the patient based on review of the above and I have provided the patient with a written personalized care plan for preventive services.     Vicie Mutters, PA-C   04/16/2013     Xray showed + pneumonia. After talking with the patient she is feeling slightly better. Will send Levaquin 500mg . She will follow up in 1 week and we will repeat CXR. She will go to the ER if she has any worsening SOB, CP, confusion, etc.

## 2013-04-17 ENCOUNTER — Ambulatory Visit (HOSPITAL_COMMUNITY)
Admission: RE | Admit: 2013-04-17 | Discharge: 2013-04-17 | Disposition: A | Discharge status: Home / Self Care | Payer: Medicare Other | Source: Ambulatory Visit | Attending: Physician Assistant | Admitting: Physician Assistant

## 2013-04-17 DIAGNOSIS — M4120 Other idiopathic scoliosis, site unspecified: Secondary | ICD-10-CM | POA: Insufficient documentation

## 2013-04-17 DIAGNOSIS — R05 Cough: Secondary | ICD-10-CM | POA: Insufficient documentation

## 2013-04-17 DIAGNOSIS — R059 Cough, unspecified: Secondary | ICD-10-CM | POA: Insufficient documentation

## 2013-04-17 DIAGNOSIS — I700 Atherosclerosis of aorta: Secondary | ICD-10-CM | POA: Insufficient documentation

## 2013-04-17 DIAGNOSIS — J209 Acute bronchitis, unspecified: Secondary | ICD-10-CM

## 2013-04-17 DIAGNOSIS — J189 Pneumonia, unspecified organism: Secondary | ICD-10-CM | POA: Insufficient documentation

## 2013-04-17 DIAGNOSIS — R062 Wheezing: Secondary | ICD-10-CM | POA: Insufficient documentation

## 2013-04-20 ENCOUNTER — Ambulatory Visit: Payer: Self-pay | Admitting: Emergency Medicine

## 2013-04-20 MED ORDER — LEVOFLOXACIN 500 MG PO TABS: 500.0000 mg | ORAL_TABLET | Freq: Every day | ORAL | Status: AC

## 2013-04-20 NOTE — Addendum Note (Signed)
Addended by: Vladimir Crofts on: 04/20/2013 08:37 AM   Modules accepted: Orders

## 2013-04-28 ENCOUNTER — Encounter: Payer: Self-pay | Admitting: Physician Assistant

## 2013-04-28 ENCOUNTER — Ambulatory Visit (INDEPENDENT_AMBULATORY_CARE_PROVIDER_SITE_OTHER): Payer: Medicare Other | Admitting: Physician Assistant

## 2013-04-28 VITALS — BP 128/72 | HR 60 | Temp 98.3°F | Resp 16 | Ht 61.5 in | Wt 146.0 lb

## 2013-04-28 DIAGNOSIS — R059 Cough, unspecified: Secondary | ICD-10-CM

## 2013-04-28 DIAGNOSIS — R5383 Other fatigue: Principal | ICD-10-CM

## 2013-04-28 DIAGNOSIS — R5381 Other malaise: Secondary | ICD-10-CM

## 2013-04-28 DIAGNOSIS — R05 Cough: Secondary | ICD-10-CM

## 2013-04-28 NOTE — Progress Notes (Signed)
   Subjective:    Patient ID: Robin Huynh, female    DOB: 1934-06-13, 79 y.o.   MRN: [id number]  HPI 79 y.o. with history of breast cancer presents for follow up s/p pneumonia. She is still on Levquin and has 2 days left. She is feeling better, still dry cough and some fatigue but over all doing better.    Review of Systems  Constitutional: Positive for fatigue. Negative for fever, chills, diaphoresis and appetite change.  HENT: Negative.   Respiratory: Negative.   Cardiovascular: Negative.   Gastrointestinal: Negative.   Genitourinary: Negative.   Musculoskeletal: Negative.   Neurological: Negative.        Objective:   Physical Exam  Constitutional: She is oriented to person, place, and time. She appears well-developed and well-nourished.  HENT:  Head: Normocephalic and atraumatic.  Right Ear: External ear normal.  Left Ear: External ear normal.  Nose: Nose normal.  Mouth/Throat: Oropharynx is clear and moist.  Eyes: Conjunctivae are normal. Pupils are equal, round, and reactive to light.  Neck: Normal range of motion. Neck supple.  Cardiovascular: Normal rate and regular rhythm.   Pulmonary/Chest: Effort normal. No respiratory distress. She has no wheezes. She has no rales. She exhibits no tenderness.  Abdominal: Soft. Bowel sounds are normal.  Lymphadenopathy:    She has no cervical adenopathy.  Neurological: She is alert and oriented to person, place, and time.  Skin: Skin is warm and dry.      Assessment & Plan:  Follow up for pneumonia- O2 97%, doing better some fatigue. Will recheck CXR in one week.

## 2013-04-28 NOTE — Patient Instructions (Signed)

## 2013-05-08 ENCOUNTER — Ambulatory Visit (HOSPITAL_COMMUNITY)
Admission: RE | Admit: 2013-05-08 | Discharge: 2013-05-08 | Disposition: A | Discharge status: Home / Self Care | Payer: Medicare Other | Source: Ambulatory Visit | Attending: Physician Assistant | Admitting: Physician Assistant

## 2013-05-08 DIAGNOSIS — R5383 Other fatigue: Secondary | ICD-10-CM

## 2013-05-08 DIAGNOSIS — R5381 Other malaise: Secondary | ICD-10-CM | POA: Insufficient documentation

## 2013-05-08 DIAGNOSIS — R05 Cough: Secondary | ICD-10-CM

## 2013-05-08 DIAGNOSIS — R059 Cough, unspecified: Secondary | ICD-10-CM | POA: Insufficient documentation

## 2013-05-08 DIAGNOSIS — Z8701 Personal history of pneumonia (recurrent): Secondary | ICD-10-CM | POA: Insufficient documentation

## 2013-06-05 ENCOUNTER — Other Ambulatory Visit (INDEPENDENT_AMBULATORY_CARE_PROVIDER_SITE_OTHER): Payer: Self-pay

## 2013-06-05 DIAGNOSIS — C50919 Malignant neoplasm of unspecified site of unspecified female breast: Secondary | ICD-10-CM

## 2013-06-05 MED ORDER — UNABLE TO FIND: Status: DC

## 2013-06-15 ENCOUNTER — Other Ambulatory Visit (INDEPENDENT_AMBULATORY_CARE_PROVIDER_SITE_OTHER): Payer: Self-pay

## 2013-06-15 MED ORDER — UNABLE TO FIND: Status: DC

## 2013-06-30 ENCOUNTER — Ambulatory Visit: Payer: Self-pay | Admitting: Emergency Medicine

## 2013-06-30 ENCOUNTER — Ambulatory Visit (INDEPENDENT_AMBULATORY_CARE_PROVIDER_SITE_OTHER): Payer: 59 | Admitting: Internal Medicine

## 2013-06-30 ENCOUNTER — Encounter: Payer: Self-pay | Admitting: Internal Medicine

## 2013-06-30 VITALS — BP 118/64 | HR 64 | Temp 98.1°F | Resp 16 | Ht 60.5 in | Wt 150.0 lb

## 2013-06-30 DIAGNOSIS — Z79899 Other long term (current) drug therapy: Secondary | ICD-10-CM

## 2013-06-30 DIAGNOSIS — M7662 Achilles tendinitis, left leg: Secondary | ICD-10-CM

## 2013-06-30 DIAGNOSIS — M7660 Achilles tendinitis, unspecified leg: Secondary | ICD-10-CM

## 2013-06-30 MED ORDER — PREDNISONE 20 MG PO TABS: ORAL_TABLET | ORAL | Status: DC

## 2013-06-30 NOTE — Patient Instructions (Signed)
Achilles Tendinitis Achilles tendinitis is inflammation of the tough, cord-like band that attaches the lower muscles of your leg to your heel (Achilles tendon). It is usually caused by overusing the tendon and joint involved.  CAUSES Achilles tendinitis can happen because of:  A sudden increase in exercise or activity (such as running).  Doing the same exercises or activities (such as jumping) over and over.  Not warming up calf muscles before exercising.  Exercising in shoes that are worn out or not made for exercise.  Having arthritis or a bone growth on the back of the heel bone. This can rub against the tendon and hurt the tendon. SIGNS AND SYMPTOMS The most common symptoms are:  Pain in the back of the leg, just above the heel. The pain usually gets worse with exercise and better with rest.  Stiffness or soreness in the back of the leg, especially in the morning.  Swelling of the skin over the Achilles tendon.  Trouble standing on tiptoe. Sometimes, an Achilles tendon tears (ruptures). Symptoms of an Achilles tendon rupture can include:  Sudden, severe pain in the back of the leg.  Trouble putting weight on the foot or walking normally. DIAGNOSIS Achilles tendinitis will be diagnosed based on symptoms and a physical examination. An X-ray may be done to check if another condition is causing your symptoms. An MRI may be ordered if your health care Kaliel Bolds suspects you may have completely torn your tendon, which is called an Achilles tendon rupture.  TREATMENT  Achilles tendinitis usually gets better over time. It can take weeks to months to heal completely. Treatment focuses on treating the symptoms and helping the injury heal. HOME CARE INSTRUCTIONS   Rest your Achilles tendon and avoid activities that cause pain.  Apply ice to the injured area:  Put ice in a plastic bag.  Place a towel between your skin and the bag.  Leave the ice on for 20 minutes, 2-3 times a  day  Try to avoid using the tendon (other than gentle range of motion) while the tendon is painful. Do not resume use until instructed by your health care Glendy Barsanti. Then begin use gradually. Do not increase use to the point of pain. If pain does develop, decrease use and continue the above measures. Gradually increase activities that do not cause discomfort until you achieve normal use.  Do exercises to make your calf muscles stronger and more flexible. Your health care Amaree Leeper or physical therapist can recommend exercises for you to do.  Wrap your ankle with an elastic bandage or other wrap. This can help keep your tendon from moving too much. Your health care Jasani Dolney will show you how to wrap your ankle correctly.  Only take over-the-counter or prescription medicines for pain, discomfort, or fever as directed by your health care Mahathi Pokorney. SEEK MEDICAL CARE IF:   Your pain and swelling increase or pain is uncontrolled with medicines.  You develop new, unexplained symptoms or your symptoms get worse.  You are unable to move your toes or foot.  You develop warmth and swelling in your foot.  You have an unexplained temperature. MAKE SURE YOU:   Understand these instructions.  Will watch your condition.  Will get help right away if you are not doing well or get worse. Document Released: 10/04/2004 Document Revised: 10/15/2012 Document Reviewed: 08/06/2012 [facility] Patient Information 2015 [place], [place]. This information is not intended to replace advice given to you by your health care Sergi Gellner. Make sure you discuss  any questions you have with your health care Lennan Malone.   Achilles Tendinitis  with Rehab Achilles tendinitis is a disorder of the Achilles tendon. The Achilles tendon connects the large calf muscles (Gastrocnemius and Soleus) to the heel bone (calcaneus). This tendon is sometimes called the heel cord. It is important for pushing-off and standing on your toes and is  important for walking, running, or jumping. Tendinitis is often caused by overuse and repetitive microtrauma. SYMPTOMS  Pain, tenderness, swelling, warmth, and redness may occur over the Achilles tendon even at rest.  Pain with pushing off, or flexing or extending the ankle.  Pain that is worsened after or during activity. CAUSES   Overuse sometimes seen with rapid increase in exercise programs or in sports requiring running and jumping.  Poor physical conditioning (strength and flexibility or endurance).  Running sports, especially training running down hills.  Inadequate warm-up before practice or play or failure to stretch before participation.  Injury to the tendon. PREVENTION   Warm up and stretch before practice or competition.  Allow time for adequate rest and recovery between practices and competition.  Keep up conditioning.  Keep up ankle and leg flexibility.  Improve or keep muscle strength and endurance.  Improve cardiovascular fitness.  Use proper technique.  Use proper equipment (shoes, skates).  To help prevent recurrence, taping, protective strapping, or an adhesive bandage may be recommended for several weeks after healing is complete. PROGNOSIS   Recovery may take weeks to several months to heal.  Longer recovery is expected if symptoms have been prolonged.  Recovery is usually quicker if the inflammation is due to a direct blow as compared with overuse or sudden strain. RELATED COMPLICATIONS   Healing time will be prolonged if the condition is not correctly treated. The injury must be given plenty of time to heal.  Symptoms can reoccur if activity is resumed too soon.  Untreated, tendinitis may increase the risk of tendon rupture requiring additional time for recovery and possibly surgery. TREATMENT   The first treatment consists of rest anti-inflammatory medication, and ice to relieve the pain.  Stretching and strengthening exercises after  resolution of pain will likely help reduce the risk of recurrence. Referral to a physical therapist or athletic trainer for further evaluation and treatment may be helpful.  A walking boot or cast may be recommended to rest the Achilles tendon. This can help break the cycle of inflammation and microtrauma.  Arch supports (orthotics) may be prescribed or recommended by your caregiver as an adjunct to therapy and rest.  Surgery to remove the inflamed tendon lining or degenerated tendon tissue is rarely necessary and has shown less than predictable results. MEDICATION   Nonsteroidal anti-inflammatory medications, such as aspirin and ibuprofen, may be used for pain and inflammation relief. Do not take within 7 days before surgery. Take these as directed by your caregiver. Contact your caregiver immediately if any bleeding, stomach upset, or signs of allergic reaction occur. Other minor pain relievers, such as acetaminophen, may also be used.  Pain relievers may be prescribed as necessary by your caregiver. Do not take prescription pain medication for longer than 4 to 7 days. Use only as directed and only as much as you need.  Cortisone injections are rarely indicated. Cortisone injections may weaken tendons and predispose to rupture. It is better to give the condition more time to heal than to use them. HEAT AND COLD  Cold is used to relieve pain and reduce inflammation for acute and  chronic Achilles tendinitis. Cold should be applied for 10 to 15 minutes every 2 to 3 hours for inflammation and pain and immediately after any activity that aggravates your symptoms. Use ice packs or an ice massage.  Heat may be used before performing stretching and strengthening activities prescribed by your caregiver. Use a heat pack or a warm soak. SEEK MEDICAL CARE IF:  Symptoms get worse or do not improve in 2 weeks despite treatment.  New, unexplained symptoms develop. Drugs used in treatment may produce side  effects. EXERCISES RANGE OF MOTION (ROM) AND STRETCHING EXERCISES - Achilles Tendinitis  These exercises may help you when beginning to rehabilitate your injury. Your symptoms may resolve with or without further involvement from your physician, physical therapist or athletic trainer. While completing these exercises, remember:   Restoring tissue flexibility helps normal motion to return to the joints. This allows healthier, less painful movement and activity.  An effective stretch should be held for at least 30 seconds.  A stretch should never be painful. You should only feel a gentle lengthening or release in the stretched tissue. STRETCH - Gastroc, Standing   Place hands on wall.  Extend right / left leg, keeping the front knee somewhat bent.  Slightly point your toes inward on your back foot.  Keeping your right / left heel on the floor and your knee straight, shift your weight toward the wall, not allowing your back to arch.  You should feel a gentle stretch in the right / left calf. Hold this position for __________ seconds. Repeat __________ times. Complete this stretch __________ times per day. STRETCH - Soleus, Standing   Place hands on wall.  Extend right / left leg, keeping the other knee somewhat bent.  Slightly point your toes inward on your back foot.  Keep your right / left heel on the floor, bend your back knee, and slightly shift your weight over the back leg so that you feel a gentle stretch deep in your back calf.  Hold this position for __________ seconds. Repeat __________ times. Complete this stretch __________ times per day. STRETCH - Gastrocsoleus, Standing  Note: This exercise can place a lot of stress on your foot and ankle. Please complete this exercise only if specifically instructed by your caregiver.   Place the ball of your right / left foot on a step, keeping your other foot firmly on the same step.  Hold on to the wall or a rail for  balance.  Slowly lift your other foot, allowing your body weight to press your heel down over the edge of the step.  You should feel a stretch in your right / left calf.  Hold this position for __________ seconds.  Repeat this exercise with a slight bend in your knee. Repeat __________ times. Complete this stretch __________ times per day.  STRENGTHENING EXERCISES - Achilles Tendinitis These exercises may help you when beginning to rehabilitate your injury. They may resolve your symptoms with or without further involvement from your physician, physical therapist or athletic trainer. While completing these exercises, remember:   Muscles can gain both the endurance and the strength needed for everyday activities through controlled exercises.  Complete these exercises as instructed by your physician, physical therapist or athletic trainer. Progress the resistance and repetitions only as guided.  You may experience muscle soreness or fatigue, but the pain or discomfort you are trying to eliminate should never worsen during these exercises. If this pain does worsen, stop and make certain you  are following the directions exactly. If the pain is still present after adjustments, discontinue the exercise until you can discuss the trouble with your clinician. STRENGTH - Plantar-flexors   Sit with your right / left leg extended. Holding onto both ends of a rubber exercise band/tubing, loop it around the ball of your foot. Keep a slight tension in the band.  Slowly push your toes away from you, pointing them downward.  Hold this position for __________ seconds. Return slowly, controlling the tension in the band/tubing. Repeat __________ times. Complete this exercise __________ times per day.  STRENGTH - Plantar-flexors   Stand with your feet shoulder width apart. Steady yourself with a wall or table using as little support as needed.  Keeping your weight evenly spread over the width of your feet,  rise up on your toes.*  Hold this position for __________ seconds. Repeat __________ times. Complete this exercise __________ times per day.  *If this is too easy, shift your weight toward your right / left leg until you feel challenged. Ultimately, you may be asked to do this exercise with your right / left foot only. STRENGTH - Plantar-flexors, Eccentric  Note: This exercise can place a lot of stress on your foot and ankle. Please complete this exercise only if specifically instructed by your caregiver.   Place the balls of your feet on a step. With your hands, use only enough support from a wall or rail to keep your balance.  Keep your knees straight and rise up on your toes.  Slowly shift your weight entirely to your right / left toes and pick up your opposite foot. Gently and with controlled movement, lower your weight through your right / left foot so that your heel drops below the level of the step. You will feel a slight stretch in the back of your calf at the end position.  Use the healthy leg to help rise up onto the balls of both feet, then lower weight only on the right / left leg again. Build up to 15 repetitions. Then progress to 3 consecutive sets of 15 repetitions.*  After completing the above exercise, complete the same exercise with a slight knee bend (about 30 degrees). Again, build up to 15 repetitions. Then progress to 3 consecutive sets of 15 repetitions.* Perform this exercise __________ times per day.  *When you easily complete 3 sets of 15, your physician, physical therapist or athletic trainer may advise you to add resistance by wearing a backpack filled with additional weight. STRENGTH - Plantar Flexors, Seated   Sit on a chair that allows your feet to rest flat on the ground. If necessary, sit at the edge of the chair.  Keeping your toes firmly on the ground, lift your right / left heel as far as you can without increasing any discomfort in your ankle. Repeat  __________ times. Complete this exercise __________ times a day. *If instructed by your physician, physical therapist or athletic trainer, you may add ____________________ of resistance by placing a weighted object on your right / left knee. Document Released: 07/26/2004 Document Revised: 03/19/2011 Document Reviewed: 04/08/2008 [facility] Patient Information 2015 [place], [place]. This information is not intended to replace advice given to you by your health care Terrick Allred. Make sure you discuss any questions you have with your health care Kynsli Haapala.

## 2013-06-30 NOTE — Progress Notes (Signed)
   Subjective:    Patient ID: Robin Huynh, female    DOB: May 24, 1934, 79 y.o.   MRN: [id number]  HPI patient present with c/o insidious pain of lateral Lt ankle for 4 days. Denies injury or prior Hx.  Scheduled Meds: Continuous Infusions: PRN Meds:.   No Known Allergies Past Medical History  Diagnosis Date  . Breast cancer 08/2011    right  . Labile hypertension   . Hypothyroidism   . Prediabetes   . Vitamin D deficiency   . Diverticulosis   . Thoracic aorta atherosclerosis     via CXR   Past Surgical History  Procedure Laterality Date  . Breast surgery      right breast  . Colonoscopy    . Mastectomy complete / simple w/ sentinel node biopsy  08/10/11    right  . Mastectomy w/ sentinel node biopsy  08/10/2011    Procedure: MASTECTOMY WITH SENTINEL LYMPH NODE BIOPSY;  Surgeon: Harl Bowie, MD;  Location: [place];  Service: General;  Laterality: Right;   Review of Systems non- contributory.  Objective:   Physical Exam BP 118/64  P 64  T 98.1 F   R 16  Ht 5' 0.5" Wt 150 lb  BMI 28.80 kg/m2  Focused exam of Lt ankle finds sl STS over the lateral deltoid ligament area and also over the achilles insertion. No erythema or ecchymoses. PP nl , nl capillary refill & no signs of ischemia or infection. Gait is limping favoring the Left leg.  Assessment & Plan:   1. Achilles tendinitis of left lower extremity -Rx Pred 20 mg #20 - pulse/taper - recc elastic ankle support - ROV - prn

## 2013-07-15 ENCOUNTER — Encounter: Payer: Self-pay | Admitting: Internal Medicine

## 2013-07-15 ENCOUNTER — Ambulatory Visit (INDEPENDENT_AMBULATORY_CARE_PROVIDER_SITE_OTHER): Payer: Medicare Other | Admitting: Internal Medicine

## 2013-07-15 VITALS — BP 126/82 | HR 60 | Temp 97.8°F | Resp 16 | Ht 60.0 in | Wt 147.4 lb

## 2013-07-15 DIAGNOSIS — R0989 Other specified symptoms and signs involving the circulatory and respiratory systems: Secondary | ICD-10-CM

## 2013-07-15 DIAGNOSIS — Z1212 Encounter for screening for malignant neoplasm of rectum: Secondary | ICD-10-CM

## 2013-07-15 DIAGNOSIS — Z0000 Encounter for general adult medical examination without abnormal findings: Secondary | ICD-10-CM

## 2013-07-15 DIAGNOSIS — Z79899 Other long term (current) drug therapy: Secondary | ICD-10-CM

## 2013-07-15 DIAGNOSIS — Z1331 Encounter for screening for depression: Secondary | ICD-10-CM

## 2013-07-15 DIAGNOSIS — E559 Vitamin D deficiency, unspecified: Secondary | ICD-10-CM

## 2013-07-15 DIAGNOSIS — E782 Mixed hyperlipidemia: Secondary | ICD-10-CM

## 2013-07-15 DIAGNOSIS — R7303 Prediabetes: Secondary | ICD-10-CM

## 2013-07-15 DIAGNOSIS — Z789 Other specified health status: Secondary | ICD-10-CM

## 2013-07-15 DIAGNOSIS — I10 Essential (primary) hypertension: Secondary | ICD-10-CM

## 2013-07-15 LAB — CBC WITH DIFFERENTIAL/PLATELET
BASOS PCT: 0 % (ref 0–1)
Basophils Absolute: 0 10*3/uL (ref 0.0–0.1)
Eosinophils Absolute: 0.2 10*3/uL (ref 0.0–0.7)
Eosinophils Relative: 2 % (ref 0–5)
HCT: 35.9 % — ABNORMAL LOW (ref 36.0–46.0)
Hemoglobin: 12.2 g/dL (ref 12.0–15.0)
Lymphocytes Relative: 36 % (ref 12–46)
Lymphs Abs: 2.8 10*3/uL (ref 0.7–4.0)
MCH: 27.9 pg (ref 26.0–34.0)
MCHC: 34 g/dL (ref 30.0–36.0)
MCV: 82 fL (ref 78.0–100.0)
Monocytes Absolute: 0.6 10*3/uL (ref 0.1–1.0)
Monocytes Relative: 8 % (ref 3–12)
NEUTROS ABS: 4.2 10*3/uL (ref 1.7–7.7)
NEUTROS PCT: 54 % (ref 43–77)
PLATELETS: 139 10*3/uL — AB (ref 150–400)
RBC: 4.38 MIL/uL (ref 3.87–5.11)
RDW: 15.3 % (ref 11.5–15.5)
WBC: 7.7 10*3/uL (ref 4.0–10.5)

## 2013-07-15 NOTE — Patient Instructions (Addendum)
Recommend the book "The END of DIETING" by Dr Baker Janus   and the book "The END of DIABETES " by Dr Excell Seltzer  At [email or web address] - get book & Audio CD's   Also his book on "SuperImmunity"    Being diabetic has a  300% increased risk for heart attack, stroke, cancer, and alzheimer- type vascular dementia. It is very important that you work harder with diet by avoiding all foods that are white except chicken & fish. Avoid white rice (brown & wild rice is OK), white potatoes (sweetpotatoes in moderation is OK), White bread or wheat bread or anything made out of white flour like bagels, donuts, rolls, buns, biscuits, cakes, pastries, cookies, pizza crust, and pasta (made from white flour & egg whites) - vegetarian pasta or spinach or wheat pasta is OK. Multigrain breads like Arnold's or [employer], or multigrain sandwich thins or flatbreads.  Diet, exercise and weight loss can reverse and cure diabetes in the early stages.  Diet, exercise and weight loss is very important in the control and prevention of complications of diabetes which affects every system in your body, ie. Brain - dementia/stroke, eyes - glaucoma/blindness, heart - heart attack/heart failure, kidneys - dialysis, stomach - gastric paralysis, intestines - malabsorption, nerves - severe painful neuritis, circulation - gangrene & loss of a leg(s), and finally cancer and Alzheimers.    I recommend avoid fried & greasy foods,  sweets/candy, white rice (brown or wild rice or Quinoa is OK), white potatoes (sweet potatoes are OK) - anything made from white flour - bagels, doughnuts, rolls, buns, biscuits,white and wheat breads, pizza crust and traditional pasta made of white flour & egg white(vegetarian pasta or spinach or wheat pasta is OK).  Multi-grain bread is OK - like multi-grain flat bread or sandwich thins. Avoid alcohol in excess. Exercise is also important.    Eat all the vegetables you want - avoid meat, especially red meat and  dairy - especially cheese.  Cheese is the most concentrated form of trans-fats which is the worst thing to clog up our arteries. Veggie cheese is OK which can be found in the fresh produce section at [employer] or [employer] or [employer]  Preventive Care for Adults A healthy lifestyle and preventive care can promote health and wellness. Preventive health guidelines for women include the following key practices.  A routine yearly physical is a good way to check with your health care provider about your health and preventive screening. It is a chance to share any concerns and updates on your health and to receive a thorough exam.  Visit your dentist for a routine exam and preventive care every 6 months. Brush your teeth twice a day and floss once a day. Good oral hygiene prevents tooth decay and gum disease.  The frequency of eye exams is based on your age, health, family medical history, use of contact lenses, and other factors. Follow your health care provider's recommendations for frequency of eye exams.  Eat a healthy diet. Foods like vegetables, fruits, whole grains, low-fat dairy products, and lean protein foods contain the nutrients you need without too many calories. Decrease your intake of foods high in solid fats, added sugars, and salt. Eat the right amount of calories for you.Get information about a proper diet from your health care provider, if necessary.  Regular physical exercise is one of the most important things you can do for your health. Most adults should get at least 150 minutes of moderate-intensity  exercise (any activity that increases your heart rate and causes you to sweat) each week. In addition, most adults need muscle-strengthening exercises on 2 or more days a week.  Maintain a healthy weight. The body mass index (BMI) is a screening tool to identify possible weight problems. It provides an estimate of body fat based on height and weight. Your health care provider can  find your BMI, and can help you achieve or maintain a healthy weight.For adults 20 years and older:  A BMI below 18.5 is considered underweight.  A BMI of 18.5 to 24.9 is normal.  A BMI of 25 to 29.9 is considered overweight.  A BMI of 30 and above is considered obese.  Maintain normal blood lipids and cholesterol levels by exercising and minimizing your intake of saturated fat. Eat a balanced diet with plenty of fruit and vegetables. Blood tests for lipids and cholesterol should begin at age 52 and be repeated every 5 years. If your lipid or cholesterol levels are high, you are over 50, or you are at high risk for heart disease, you may need your cholesterol levels checked more frequently.Ongoing high lipid and cholesterol levels should be treated with medicines if diet and exercise are not working.  If you smoke, find out from your health care provider how to quit. If you do not use tobacco, do not start.  Lung cancer screening is recommended for adults aged 77-80 years who are at high risk for developing lung cancer because of a history of smoking. A yearly low-dose CT scan of the lungs is recommended for people who have at least a 30-pack-year history of smoking and are a current smoker or have quit within the past 15 years. A pack year of smoking is smoking an average of 1 pack of cigarettes a day for 1 year (for example: 1 pack a day for 30 years or 2 packs a day for 15 years). Yearly screening should continue until the smoker has stopped smoking for at least 15 years. Yearly screening should be stopped for people who develop a health problem that would prevent them from having lung cancer treatment.  If you are pregnant, do not drink alcohol. If you are breastfeeding, be very cautious about drinking alcohol. If you are not pregnant and choose to drink alcohol, do not have more than 1 drink per day. One drink is considered to be 12 ounces (355 mL) of beer, 5 ounces (148 mL) of wine, or 1.5  ounces (44 mL) of liquor.  Avoid use of street drugs. Do not share needles with anyone. Ask for help if you need support or instructions about stopping the use of drugs.  High blood pressure causes heart disease and increases the risk of stroke. Your blood pressure should be checked at least every 1 to 2 years. Ongoing high blood pressure should be treated with medicines if weight loss and exercise do not work.  If you are 12-1 years old, ask your health care provider if you should take aspirin to prevent strokes.  Diabetes screening involves taking a blood sample to check your fasting blood sugar level. This should be done once every 3 years, after age 54, if you are within normal weight and without risk factors for diabetes. Testing should be considered at a younger age or be carried out more frequently if you are overweight and have at least 1 risk factor for diabetes.  Breast cancer screening is essential preventive care for women. You should practice "breast  self-awareness." This means understanding the normal appearance and feel of your breasts and may include breast self-examination. Any changes detected, no matter how small, should be reported to a health care provider. Women in their 86s and 30s should have a clinical breast exam (CBE) by a health care provider as part of a regular health exam every 1 to 3 years. After age 71, women should have a CBE every year. Starting at age 38, women should consider having a mammogram (breast X-ray test) every year. Women who have a family history of breast cancer should talk to their health care provider about genetic screening. Women at a high risk of breast cancer should talk to their health care providers about having an MRI and a mammogram every year.  Breast cancer gene (BRCA)-related cancer risk assessment is recommended for women who have family members with BRCA-related cancers. BRCA-related cancers include breast, ovarian, tubal, and peritoneal  cancers. Having family members with these cancers may be associated with an increased risk for harmful changes (mutations) in the breast cancer genes BRCA1 and BRCA2. Results of the assessment will determine the need for genetic counseling and BRCA1 and BRCA2 testing.  Routine pelvic exams to screen for cancer are no longer recommended for nonpregnant women who are considered low risk for cancer of the pelvic organs (ovaries, uterus, and vagina) and who do not have symptoms. Ask your health care provider if a screening pelvic exam is right for you.  If you have had past treatment for cervical cancer or a condition that could lead to cancer, you need Pap tests and screening for cancer for at least 20 years after your treatment. If Pap tests have been discontinued, your risk factors (such as having a new sexual partner) need to be reassessed to determine if screening should be resumed. Some women have medical problems that increase the chance of getting cervical cancer. In these cases, your health care provider may recommend more frequent screening and Pap tests.  The HPV test is an additional test that may be used for cervical cancer screening. The HPV test looks for the virus that can cause the cell changes on the cervix. The cells collected during the Pap test can be tested for HPV. The HPV test could be used to screen women aged 62 years and older, and should be used in women of any age who have unclear Pap test results. After the age of 54, women should have HPV testing at the same frequency as a Pap test.  Colorectal cancer can be detected and often prevented. Most routine colorectal cancer screening begins at the age of 49 years and continues through age 25 years. However, your health care provider may recommend screening at an earlier age if you have risk factors for colon cancer. On a yearly basis, your health care provider may provide home test kits to check for hidden blood in the stool. Use of a  small camera at the end of a tube, to directly examine the colon (sigmoidoscopy or colonoscopy), can detect the earliest forms of colorectal cancer. Talk to your health care provider about this at age 8, when routine screening begins. Direct exam of the colon should be repeated every 5-10 years through age 63 years, unless early forms of pre-cancerous polyps or small growths are found.  People who are at an increased risk for hepatitis B should be screened for this virus. You are considered at high risk for hepatitis B if:  You were born in a  country where hepatitis B occurs often. Talk with your health care provider about which countries are considered high risk.  Your parents were born in a high-risk country and you have not received a shot to protect against hepatitis B (hepatitis B vaccine).  You have HIV or AIDS.  You use needles to inject street drugs.  You live with, or have sex with, someone who has Hepatitis B.  You get hemodialysis treatment.  You take certain medicines for conditions like cancer, organ transplantation, and autoimmune conditions.  Hepatitis C blood testing is recommended for all people born from [birth year] through 1963 and any individual with known risks for hepatitis C.  Practice safe sex. Use condoms and avoid high-risk sexual practices to reduce the spread of sexually transmitted infections (STIs). STIs include gonorrhea, chlamydia, syphilis, trichomonas, herpes, HPV, and human immunodeficiency virus (HIV). Herpes, HIV, and HPV are viral illnesses that have no cure. They can result in disability, cancer, and death.  You should be screened for sexually transmitted illnesses (STIs) including gonorrhea and chlamydia if:  You are sexually active and are younger than 24 years.  You are older than 24 years and your health care provider tells you that you are at risk for this type of infection.  Your sexual activity has changed since you were last screened and you are  at an increased risk for chlamydia or gonorrhea. Ask your health care provider if you are at risk.  If you are at risk of being infected with HIV, it is recommended that you take a prescription medicine daily to prevent HIV infection. This is called preexposure prophylaxis (PrEP). You are considered at risk if:  You are a heterosexual woman, are sexually active, and are at increased risk for HIV infection.  You take drugs by injection.  You are sexually active with a partner who has HIV.  Talk with your health care provider about whether you are at high risk of being infected with HIV. If you choose to begin PrEP, you should first be tested for HIV. You should then be tested every 3 months for as long as you are taking PrEP.  Osteoporosis is a disease in which the bones lose minerals and strength with aging. This can result in serious bone fractures or breaks. The risk of osteoporosis can be identified using a bone density scan. Women ages 47 years and over and women at risk for fractures or osteoporosis should discuss screening with their health care providers. Ask your health care provider whether you should take a calcium supplement or vitamin D to reduce the rate of osteoporosis.  Menopause can be associated with physical symptoms and risks. Hormone replacement therapy is available to decrease symptoms and risks. You should talk to your health care provider about whether hormone replacement therapy is right for you.  Use sunscreen. Apply sunscreen liberally and repeatedly throughout the day. You should seek shade when your shadow is shorter than you. Protect yourself by wearing long sleeves, pants, a wide-brimmed hat, and sunglasses year round, whenever you are outdoors.  Once a month, do a whole body skin exam, using a mirror to look at the skin on your back. Tell your health care provider of new moles, moles that have irregular borders, moles that are larger than a pencil eraser, or moles  that have changed in shape or color.  Stay current with required vaccines (immunizations).  Influenza vaccine. All adults should be immunized every year.  Tetanus, diphtheria, and acellular pertussis (Td, Tdap)  vaccine. Pregnant women should receive 1 dose of Tdap vaccine during each pregnancy. The dose should be obtained regardless of the length of time since the last dose. Immunization is preferred during the 27th-36th week of gestation. An adult who has not previously received Tdap or who does not know her vaccine status should receive 1 dose of Tdap. This initial dose should be followed by tetanus and diphtheria toxoids (Td) booster doses every 10 years. Adults with an unknown or incomplete history of completing a 3-dose immunization series with Td-containing vaccines should begin or complete a primary immunization series including a Tdap dose. Adults should receive a Td booster every 10 years.  Varicella vaccine. An adult without evidence of immunity to varicella should receive 2 doses or a second dose if she has previously received 1 dose. Pregnant females who do not have evidence of immunity should receive the first dose after pregnancy. This first dose should be obtained before leaving the health care facility. The second dose should be obtained 4-8 weeks after the first dose.  Human papillomavirus (HPV) vaccine. Females aged 13-26 years who have not received the vaccine previously should obtain the 3-dose series. The vaccine is not recommended for use in pregnant females. However, pregnancy testing is not needed before receiving a dose. If a female is found to be pregnant after receiving a dose, no treatment is needed. In that case, the remaining doses should be delayed until after the pregnancy. Immunization is recommended for any person with an immunocompromised condition through the age of 28 years if she did not get any or all doses earlier. During the 3-dose series, the second dose should be  obtained 4-8 weeks after the first dose. The third dose should be obtained 24 weeks after the first dose and 16 weeks after the second dose.  Zoster vaccine. One dose is recommended for adults aged 93 years or older unless certain conditions are present.  Measles, mumps, and rubella (MMR) vaccine. Adults born before 50 generally are considered immune to measles and mumps. Adults born in [birth year] or later should have 1 or more doses of MMR vaccine unless there is a contraindication to the vaccine or there is laboratory evidence of immunity to each of the three diseases. A routine second dose of MMR vaccine should be obtained at least 28 days after the first dose for students attending postsecondary schools, health care workers, or international travelers. People who received inactivated measles vaccine or an unknown type of measles vaccine during 1963-1967 should receive 2 doses of MMR vaccine. People who received inactivated mumps vaccine or an unknown type of mumps vaccine before 1979 and are at high risk for mumps infection should consider immunization with 2 doses of MMR vaccine. For females of childbearing age, rubella immunity should be determined. If there is no evidence of immunity, females who are not pregnant should be vaccinated. If there is no evidence of immunity, females who are pregnant should delay immunization until after pregnancy. Unvaccinated health care workers born before 55 who lack laboratory evidence of measles, mumps, or rubella immunity or laboratory confirmation of disease should consider measles and mumps immunization with 2 doses of MMR vaccine or rubella immunization with 1 dose of MMR vaccine.  Pneumococcal 13-valent conjugate (PCV13) vaccine. When indicated, a person who is uncertain of her immunization history and has no record of immunization should receive the PCV13 vaccine. An adult aged 37 years or older who has certain medical conditions and has not been previously  immunized  should receive 1 dose of PCV13 vaccine. This PCV13 should be followed with a dose of pneumococcal polysaccharide (PPSV23) vaccine. The PPSV23 vaccine dose should be obtained at least 8 weeks after the dose of PCV13 vaccine. An adult aged 7 years or older who has certain medical conditions and previously received 1 or more doses of PPSV23 vaccine should receive 1 dose of PCV13. The PCV13 vaccine dose should be obtained 1 or more years after the last PPSV23 vaccine dose.  Pneumococcal polysaccharide (PPSV23) vaccine. When PCV13 is also indicated, PCV13 should be obtained first. All adults aged 74 years and older should be immunized. An adult younger than age 43 years who has certain medical conditions should be immunized. Any person who resides in a nursing home or long-term care facility should be immunized. An adult smoker should be immunized. People with an immunocompromised condition and certain other conditions should receive both PCV13 and PPSV23 vaccines. People with human immunodeficiency virus (HIV) infection should be immunized as soon as possible after diagnosis. Immunization during chemotherapy or radiation therapy should be avoided. Routine use of PPSV23 vaccine is not recommended for American Indians, [place] Natives, or people younger than 65 years unless there are medical conditions that require PPSV23 vaccine. When indicated, people who have unknown immunization and have no record of immunization should receive PPSV23 vaccine. One-time revaccination 5 years after the first dose of PPSV23 is recommended for people aged 19-64 years who have chronic kidney failure, nephrotic syndrome, asplenia, or immunocompromised conditions. People who received 1-2 doses of PPSV23 before age 53 years should receive another dose of PPSV23 vaccine at age 19 years or later if at least 5 years have passed since the previous dose. Doses of PPSV23 are not needed for people immunized with PPSV23 at or after age 87  years.  Meningococcal vaccine. Adults with asplenia or persistent complement component deficiencies should receive 2 doses of quadrivalent meningococcal conjugate (MenACWY-D) vaccine. The doses should be obtained at least 2 months apart. Microbiologists working with certain meningococcal bacteria, [employer] recruits, people at risk during an outbreak, and people who travel to or live in countries with a high rate of meningitis should be immunized. A first-year college student up through age 82 years who is living in a residence hall should receive a dose if she did not receive a dose on or after her 16th birthday. Adults who have certain high-risk conditions should receive one or more doses of vaccine.  Hepatitis A vaccine. Adults who wish to be protected from this disease, have certain high-risk conditions, work with hepatitis A-infected animals, work in hepatitis A research labs, or travel to or work in countries with a high rate of hepatitis A should be immunized. Adults who were previously unvaccinated and who anticipate close contact with an international adoptee during the first 60 days after arrival in the [place] from a country with a high rate of hepatitis A should be immunized.  Hepatitis B vaccine. Adults who wish to be protected from this disease, have certain high-risk conditions, may be exposed to blood or other infectious body fluids, are household contacts or sex partners of hepatitis B positive people, are clients or workers in certain care facilities, or travel to or work in countries with a high rate of hepatitis B should be immunized.  Haemophilus influenzae type b (Hib) vaccine. A previously unvaccinated person with asplenia or sickle cell disease or having a scheduled splenectomy should receive 1 dose of Hib vaccine. Regardless of previous immunization, a  recipient of a hematopoietic stem cell transplant should receive a 3-dose series 6-12 months after her successful transplant.  Hib vaccine is not recommended for adults with HIV infection. Preventive Services / Frequency Ages 60 to 39years  Blood pressure check.** / Every 1 to 2 years.  Lipid and cholesterol check.** / Every 5 years beginning at age 64.  Clinical breast exam.** / Every 3 years for women in their 43s and 19s.  BRCA-related cancer risk assessment.** / For women who have family members with a BRCA-related cancer (breast, ovarian, tubal, or peritoneal cancers).  Pap test.** / Every 2 years from ages 10 through 49. Every 3 years starting at age 78 through age 54 or 42 with a history of 3 consecutive normal Pap tests.  HPV screening.** / Every 3 years from ages 7 through ages 74 to 25 with a history of 3 consecutive normal Pap tests.  Hepatitis C blood test.** / For any individual with known risks for hepatitis C.  Skin self-exam. / Monthly.  Influenza vaccine. / Every year.  Tetanus, diphtheria, and acellular pertussis (Tdap, Td) vaccine.** / Consult your health care provider. Pregnant women should receive 1 dose of Tdap vaccine during each pregnancy. 1 dose of Td every 10 years.  Varicella vaccine.** / Consult your health care provider. Pregnant females who do not have evidence of immunity should receive the first dose after pregnancy.  HPV vaccine. / 3 doses over 6 months, if 6 and younger. The vaccine is not recommended for use in pregnant females. However, pregnancy testing is not needed before receiving a dose.  Measles, mumps, rubella (MMR) vaccine.** / You need at least 1 dose of MMR if you were born in 1957 or later. You may also need a 2nd dose. For females of childbearing age, rubella immunity should be determined. If there is no evidence of immunity, females who are not pregnant should be vaccinated. If there is no evidence of immunity, females who are pregnant should delay immunization until after pregnancy.  Pneumococcal 13-valent conjugate (PCV13) vaccine.** / Consult your health  care provider.  Pneumococcal polysaccharide (PPSV23) vaccine.** / 1 to 2 doses if you smoke cigarettes or if you have certain conditions.  Meningococcal vaccine.** / 1 dose if you are age 53 to 54 years and a Market researcher living in a residence hall, or have one of several medical conditions, you need to get vaccinated against meningococcal disease. You may also need additional booster doses.  Hepatitis A vaccine.** / Consult your health care provider.  Hepatitis B vaccine.** / Consult your health care provider.  Haemophilus influenzae type b (Hib) vaccine.** / Consult your health care provider. Ages 38 to 64years  Blood pressure check.** / Every 1 to 2 years.  Lipid and cholesterol check.** / Every 5 years beginning at age 22 years.  Lung cancer screening. / Every year if you are aged 62-80 years and have a 30-pack-year history of smoking and currently smoke or have quit within the past 15 years. Yearly screening is stopped once you have quit smoking for at least 15 years or develop a health problem that would prevent you from having lung cancer treatment.  Clinical breast exam.** / Every year after age 41 years.  BRCA-related cancer risk assessment.** / For women who have family members with a BRCA-related cancer (breast, ovarian, tubal, or peritoneal cancers).  Mammogram.** / Every year beginning at age 57 years and continuing for as long as you are in good health. Consult with your  health care provider.  Pap test.** / Every 3 years starting at age 52 years through age 27 or 33 years with a history of 3 consecutive normal Pap tests.  HPV screening.** / Every 3 years from ages 35 years through ages 33 to 61 years with a history of 3 consecutive normal Pap tests.  Fecal occult blood test (FOBT) of stool. / Every year beginning at age 51 years and continuing until age 28 years. You may not need to do this test if you get a colonoscopy every 10 years.  Flexible  sigmoidoscopy or colonoscopy.** / Every 5 years for a flexible sigmoidoscopy or every 10 years for a colonoscopy beginning at age 6 years and continuing until age 33 years.  Hepatitis C blood test.** / For all people born from [birth year] through 1965 and any individual with known risks for hepatitis C.  Skin self-exam. / Monthly.  Influenza vaccine. / Every year.  Tetanus, diphtheria, and acellular pertussis (Tdap/Td) vaccine.** / Consult your health care provider. Pregnant women should receive 1 dose of Tdap vaccine during each pregnancy. 1 dose of Td every 10 years.  Varicella vaccine.** / Consult your health care provider. Pregnant females who do not have evidence of immunity should receive the first dose after pregnancy.  Zoster vaccine.** / 1 dose for adults aged 75 years or older.  Measles, mumps, rubella (MMR) vaccine.** / You need at least 1 dose of MMR if you were born in 1957 or later. You may also need a 2nd dose. For females of childbearing age, rubella immunity should be determined. If there is no evidence of immunity, females who are not pregnant should be vaccinated. If there is no evidence of immunity, females who are pregnant should delay immunization until after pregnancy.  Pneumococcal 13-valent conjugate (PCV13) vaccine.** / Consult your health care provider.  Pneumococcal polysaccharide (PPSV23) vaccine.** / 1 to 2 doses if you smoke cigarettes or if you have certain conditions.  Meningococcal vaccine.** / Consult your health care provider.  Hepatitis A vaccine.** / Consult your health care provider.  Hepatitis B vaccine.** / Consult your health care provider.  Haemophilus influenzae type b (Hib) vaccine.** / Consult your health care provider. Ages 66 years and over  Blood pressure check.** / Every 1 to 2 years.  Lipid and cholesterol check.** / Every 5 years beginning at age 43 years.  Lung cancer screening. / Every year if you are aged 82-80 years and have a  30-pack-year history of smoking and currently smoke or have quit within the past 15 years. Yearly screening is stopped once you have quit smoking for at least 15 years or develop a health problem that would prevent you from having lung cancer treatment.  Clinical breast exam.** / Every year after age 44 years.  BRCA-related cancer risk assessment.** / For women who have family members with a BRCA-related cancer (breast, ovarian, tubal, or peritoneal cancers).  Mammogram.** / Every year beginning at age 71 years and continuing for as long as you are in good health. Consult with your health care provider.  Pap test.** / Every 3 years starting at age 64 years through age 45 or 59 years with 3 consecutive normal Pap tests. Testing can be stopped between 65 and 70 years with 3 consecutive normal Pap tests and no abnormal Pap or HPV tests in the past 10 years.  HPV screening.** / Every 3 years from ages 70 years through ages 8 or 26 years with a history of 3 consecutive  normal Pap tests. Testing can be stopped between 65 and 70 years with 3 consecutive normal Pap tests and no abnormal Pap or HPV tests in the past 10 years.  Fecal occult blood test (FOBT) of stool. / Every year beginning at age 14 years and continuing until age 6 years. You may not need to do this test if you get a colonoscopy every 10 years.  Flexible sigmoidoscopy or colonoscopy.** / Every 5 years for a flexible sigmoidoscopy or every 10 years for a colonoscopy beginning at age 24 years and continuing until age 68 years.  Hepatitis C blood test.** / For all people born from [birth year] through 1965 and any individual with known risks for hepatitis C.  Osteoporosis screening.** / A one-time screening for women ages 56 years and over and women at risk for fractures or osteoporosis.  Skin self-exam. / Monthly.  Influenza vaccine. / Every year.  Tetanus, diphtheria, and acellular pertussis (Tdap/Td) vaccine.** / 1 dose of Td every 10  years.  Varicella vaccine.** / Consult your health care provider.  Zoster vaccine.** / 1 dose for adults aged 40 years or older.  Pneumococcal 13-valent conjugate (PCV13) vaccine.** / Consult your health care provider.  Pneumococcal polysaccharide (PPSV23) vaccine.** / 1 dose for all adults aged 82 years and older.  Meningococcal vaccine.** / Consult your health care provider.  Hepatitis A vaccine.** / Consult your health care provider.  Hepatitis B vaccine.** / Consult your health care provider.  Haemophilus influenzae type b (Hib) vaccine.** / Consult your health care provider. ** Family history and personal history of risk and conditions may change your health care provider's recommendations. Document Released: 02/20/2001 Document Revised: 12/30/2012 Document Reviewed: 05/22/2010 [facility] Patient Information 2015 [place], [place]. This information is not intended to replace advice given to you by your health care provider. Make sure you discuss any questions you have with your health care provider.

## 2013-07-15 NOTE — Progress Notes (Signed)
Patient ID: Robin Huynh, female   DOB: 1934-10-26, 79 y.o.   MRN: [id number]  Annual Screening Comprehensive Examination  This very nice 79 y.o.MBF presents for complete physical.  Patient has been followed for HTN,  Prediabetes, Hyperlipidemia, and Vitamin D Deficiency. Patient has Hx/o goiter and has been on replacement therapy since 2011.    Patient's BP has been monitored expectantly since 2000. Patient's BP has been controlled and today's BP: 126/82 mmHg. Patient denies any cardiac symptoms as chest pain, palpitations, shortness of breath, dizziness or ankle swelling.   Patient's hyperlipidemia is controlled with diet and supplements Patient denies myalgias or other medication SE's. Last cholesterol last visit was Lab Results  Component Value Date   CHOL 154 01/22/2013   HDL 72 01/22/2013   LDLCALC 72 01/22/2013   TRIG 51 01/22/2013   CHOLHDL 2.1 01/22/2013    Patient has prediabetes predating since  06/2009 with A1c 6.1% and she's been controled with diet.     Her last A1c was still 6.1% in Jan 2015.   Patient denies reactive hypoglycemic symptoms, visual blurring, diabetic polys, or paresthesias.    Finally, patient has history of Vitamin D Deficiency and last Vitamin D was 66 in Oct 2014.   Medication Sig  . aspirin 81 MG tablet Take 81 mg by mouth daily.  Marland Kitchen CALCIUM PO Take 1 tablet by mouth daily.  Marland Kitchen VITAMIN D 2000 UNITS tablet Take 2,000 Units by mouth 3  times daily.  . fish oil 1000 MG cap Take 2 g by mouth daily.  Marland Kitchen levothyroxine  100 MCG tablet  Take 100 mcg on Mon wed and fri and 1/2 tablet the rest of the week  . magnesium 30 MG tablet Take 30 mg by mouth 2  times daily.  . MULTIVITAMIN  Take 1 tablet by mouth daily.  . Red Yeast Rice 600 MG CAPS Take 600 mg by mouth daily.   . tamoxifen  20 MG tablet Take 1 tablet  by mouth daily.  . vitamin C 500 MG tablet Take 500 mg by mouth daily.   No Known Allergies  Past Medical History  Diagnosis Date  . Breast cancer 08/2011     right  . Labile hypertension   . Hypothyroidism   . Prediabetes   . Vitamin D deficiency   . Diverticulosis   . Thoracic aorta atherosclerosis     via CXR   Past Surgical History  Procedure Laterality Date  . Breast surgery      right breast  . Colonoscopy    . Mastectomy complete / simple w/ sentinel node biopsy  08/10/11    right  . Mastectomy w/ sentinel node biopsy  08/10/2011    Procedure: MASTECTOMY WITH SENTINEL LYMPH NODE BIOPSY;  Surgeon: Harl Bowie, MD;  Location: [facility];  Service: General;  Laterality: Right;   Family History  Problem Relation Age of Onset  . Colon cancer Mother 80  . Prostate cancer Father   . Lung cancer Brother   . Lung cancer Sister   . Kidney cancer Brother    History  Substance Use Topics  . Smoking status: Never Smoker   . Smokeless tobacco: Never Used  . Alcohol Use: No    ROS Constitutional: Denies fever, chills, weight loss/gain, headaches, insomnia, fatigue, night sweats, and change in appetite. Eyes: Denies redness, blurred vision, diplopia, discharge, itchy, watery eyes.  ENT: Denies discharge, congestion, post nasal drip, epistaxis, sore throat, earache, hearing loss, dental pain,  Tinnitus, Vertigo, Sinus pain, snoring.  Cardio: Denies chest pain, palpitations, irregular heartbeat, syncope, dyspnea, diaphoresis, orthopnea, PND, claudication, edema Respiratory: denies cough, dyspnea, DOE, pleurisy, hoarseness, laryngitis, wheezing.  Gastrointestinal: Denies dysphagia, heartburn, reflux, water brash, pain, cramps, nausea, vomiting, bloating, diarrhea, constipation, hematemesis, melena, hematochezia, jaundice, hemorrhoids Genitourinary: Denies dysuria, frequency, urgency, nocturia, hesitancy, discharge, hematuria, flank pain Breast: Breast lumps, nipple discharge, bleeding.  Musculoskeletal: Denies arthralgia, myalgia, stiffness, Jt. Swelling, pain, limp, and strain/sprain. Denies falls. Skin: Denies puritis, rash, hives, warts,  acne, eczema, changing in skin lesion Neuro: No weakness, tremor, incoordination, spasms, paresthesia, pain Psychiatric: Denies confusion, memory loss, sensory loss. Denies Depression. Endocrine: Denies change in weight, skin, hair change, nocturia, and paresthesia, diabetic polys, visual blurring, hyper / hypo glycemic episodes.  Heme/Lymph: No excessive bleeding, bruising, enlarged lymph nodes.  Physical Exam  BP 126/82  P 60  T 97.8 F   R 16  Ht 5'   Wt 147 lb 6.4 oz   BMI 28.79 kg/m2  General Appearance: Well nourished and in no apparent distress. Eyes: PERRLA, EOMs, conjunctiva no swelling or erythema, normal fundi and vessels. Sinuses: No frontal/maxillary tenderness ENT/Mouth: EACs patent / TMs  nl. Nares clear without erythema, swelling, mucoid exudates. Oral hygiene is good. No erythema, swelling, or exudate. Tongue normal, non-obstructing. Tonsils not swollen or erythematous. Hearing normal.  Neck: Supple, thyroid normal. No bruits, nodes or JVD. Respiratory: Respiratory effort normal.  BS equal and clear bilateral without rales, rhonci, wheezing or stridor. Cardio: Heart sounds are normal with regular rate and rhythm and no murmurs, rubs or gallops. Peripheral pulses are normal and equal bilaterally without edema. No aortic or femoral bruits. Chest: symmetric with normal excursions and percussion. Breasts: Rightt breast surgically absent w/o signs of local reccurance and Left without lumps, nipple discharge, retractions, or fibrocystic changes.  Abdomen: Flat, soft, with bowl sounds. Nontender, no guarding, rebound, hernias, masses, or organomegaly.  Lymphatics: Non tender without lymphadenopathy.  Musculoskeletal: Full ROM all peripheral extremities, joint stability, 5/5 strength, and normal gait. Skin: Warm and dry without rashes, lesions, cyanosis, clubbing or  ecchymosis.  Neuro: Cranial nerves intact, reflexes equal bilaterally. Normal muscle tone, no cerebellar  symptoms. Sensation intact.  Pysch: Awake and oriented X 3, normal affect, Insight and Judgment appropriate.   Assessment and Plan  1. Annual Screening Examination 2. Hypertension  3. Hyperlipidemia 4. Pre Diabetes 5. Vitamin D Deficiency  Continue prudent diet as discussed, weight control, BP monitoring, regular exercise, and medications. Discussed med's effects and SE's. Screening labs and tests as requested with regular follow-up as recommended.

## 2013-07-16 ENCOUNTER — Encounter: Payer: Self-pay | Admitting: Internal Medicine

## 2013-07-16 LAB — HEMOGLOBIN A1C
Hgb A1c MFr Bld: 6.1 % — ABNORMAL HIGH (ref <5.7)
MEAN PLASMA GLUCOSE: 128 mg/dL — AB (ref <117)

## 2013-07-16 LAB — HEPATIC FUNCTION PANEL
ALT: <8 U/L (ref 0–35)
AST: 24 U/L (ref 0–37)
Albumin: 3.6 g/dL (ref 3.5–5.2)
Alkaline Phosphatase: 47 U/L (ref 39–117)
BILIRUBIN DIRECT: 0.1 mg/dL (ref 0.0–0.3)
BILIRUBIN INDIRECT: 0.2 mg/dL (ref 0.2–1.2)
Total Bilirubin: 0.3 mg/dL (ref 0.2–1.2)
Total Protein: 6.2 g/dL (ref 6.0–8.3)

## 2013-07-16 LAB — URINALYSIS, MICROSCOPIC ONLY
BACTERIA UA: NONE SEEN
Casts: NONE SEEN
Crystals: NONE SEEN
Squamous Epithelial / LPF: NONE SEEN

## 2013-07-16 LAB — LIPID PANEL
CHOLESTEROL: 151 mg/dL (ref 0–200)
HDL: 70 mg/dL (ref >39)
LDL Cholesterol: 69 mg/dL (ref 0–99)
Total CHOL/HDL Ratio: 2.2 Ratio
Triglycerides: 61 mg/dL (ref <150)
VLDL: 12 mg/dL (ref 0–40)

## 2013-07-16 LAB — BASIC METABOLIC PANEL WITH GFR
BUN: 20 mg/dL (ref 6–23)
CO2: 26 mEq/L (ref 19–32)
Calcium: 8.9 mg/dL (ref 8.4–10.5)
Chloride: 98 mEq/L (ref 96–112)
Creat: 0.81 mg/dL (ref 0.50–1.10)
GFR, Est African American: 81 mL/min
GFR, Est Non African American: 70 mL/min
Glucose, Bld: 89 mg/dL (ref 70–99)
POTASSIUM: 4.3 meq/L (ref 3.5–5.3)
SODIUM: 132 meq/L — AB (ref 135–145)

## 2013-07-16 LAB — MICROALBUMIN / CREATININE URINE RATIO
CREATININE, URINE: 18.8 mg/dL
MICROALB/CREAT RATIO: 26.6 mg/g (ref 0.0–30.0)
Microalb, Ur: 0.5 mg/dL (ref 0.00–1.89)

## 2013-07-16 LAB — VITAMIN D 25 HYDROXY (VIT D DEFICIENCY, FRACTURES): VIT D 25 HYDROXY: 107 ng/mL — AB (ref 30–89)

## 2013-07-16 LAB — INSULIN, FASTING: Insulin fasting, serum: 6 u[IU]/mL (ref 3–28)

## 2013-07-16 LAB — TSH: TSH: 0.431 u[IU]/mL (ref 0.350–4.500)

## 2013-07-16 LAB — MAGNESIUM: Magnesium: 1.8 mg/dL (ref 1.5–2.5)

## 2013-07-27 ENCOUNTER — Other Ambulatory Visit (INDEPENDENT_AMBULATORY_CARE_PROVIDER_SITE_OTHER): Payer: Medicare Other

## 2013-07-27 DIAGNOSIS — Z1212 Encounter for screening for malignant neoplasm of rectum: Secondary | ICD-10-CM

## 2013-07-27 LAB — POC HEMOCCULT BLD/STL (HOME/3-CARD/SCREEN)
Card #3 Fecal Occult Blood, POC: NEGATIVE
FECAL OCCULT BLD: NEGATIVE
Fecal Occult Blood, POC: NEGATIVE

## 2013-09-07 ENCOUNTER — Encounter: Payer: Self-pay | Admitting: Surgery

## 2013-10-03 ENCOUNTER — Telehealth: Payer: Self-pay | Admitting: Hematology and Oncology

## 2013-10-03 NOTE — Telephone Encounter (Signed)
Lft msg for pt regarding labs/ov and MD change, mailed sch........ KJ °

## 2013-10-26 ENCOUNTER — Ambulatory Visit (INDEPENDENT_AMBULATORY_CARE_PROVIDER_SITE_OTHER): Payer: 59 | Admitting: Physician Assistant

## 2013-10-26 ENCOUNTER — Encounter: Payer: Self-pay | Admitting: Physician Assistant

## 2013-10-26 VITALS — BP 122/70 | HR 56 | Temp 97.9°F | Resp 16 | Ht 60.5 in | Wt 142.0 lb

## 2013-10-26 DIAGNOSIS — R7309 Other abnormal glucose: Secondary | ICD-10-CM

## 2013-10-26 DIAGNOSIS — E039 Hypothyroidism, unspecified: Secondary | ICD-10-CM

## 2013-10-26 DIAGNOSIS — E782 Mixed hyperlipidemia: Secondary | ICD-10-CM

## 2013-10-26 DIAGNOSIS — Z79899 Other long term (current) drug therapy: Secondary | ICD-10-CM

## 2013-10-26 DIAGNOSIS — C44301 Unspecified malignant neoplasm of skin of nose: Secondary | ICD-10-CM

## 2013-10-26 DIAGNOSIS — R0989 Other specified symptoms and signs involving the circulatory and respiratory systems: Secondary | ICD-10-CM

## 2013-10-26 DIAGNOSIS — I10 Essential (primary) hypertension: Secondary | ICD-10-CM

## 2013-10-26 DIAGNOSIS — E559 Vitamin D deficiency, unspecified: Secondary | ICD-10-CM

## 2013-10-26 DIAGNOSIS — R7303 Prediabetes: Secondary | ICD-10-CM

## 2013-10-26 LAB — CBC WITH DIFFERENTIAL/PLATELET
Basophils Absolute: 0 10*3/uL (ref 0.0–0.1)
Basophils Relative: 0 % (ref 0–1)
Eosinophils Absolute: 0.1 10*3/uL (ref 0.0–0.7)
Eosinophils Relative: 2 % (ref 0–5)
HCT: 37.1 % (ref 36.0–46.0)
Hemoglobin: 12.4 g/dL (ref 12.0–15.0)
LYMPHS ABS: 2.2 10*3/uL (ref 0.7–4.0)
LYMPHS PCT: 40 % (ref 12–46)
MCH: 28.2 pg (ref 26.0–34.0)
MCHC: 33.4 g/dL (ref 30.0–36.0)
MCV: 84.5 fL (ref 78.0–100.0)
Monocytes Absolute: 0.5 10*3/uL (ref 0.1–1.0)
Monocytes Relative: 9 % (ref 3–12)
NEUTROS PCT: 49 % (ref 43–77)
Neutro Abs: 2.6 10*3/uL (ref 1.7–7.7)
Platelets: 162 10*3/uL (ref 150–400)
RBC: 4.39 MIL/uL (ref 3.87–5.11)
RDW: 14.3 % (ref 11.5–15.5)
WBC: 5.4 10*3/uL (ref 4.0–10.5)

## 2013-10-26 LAB — HEMOGLOBIN A1C
Hgb A1c MFr Bld: 6.4 % — ABNORMAL HIGH (ref <5.7)
Mean Plasma Glucose: 137 mg/dL — ABNORMAL HIGH (ref <117)

## 2013-10-26 NOTE — Patient Instructions (Signed)

## 2013-10-26 NOTE — Progress Notes (Signed)
Assessment and Plan:  Hypertension: Continue medication, monitor blood pressure at home. Continue DASH diet.  Reminder to go to the ER if any CP, SOB, nausea, dizziness, severe HA, changes vision/speech, left arm numbness and tingling, and jaw pain. Cholesterol: Continue diet and exercise. Check cholesterol.  Pre-diabetes-Continue diet and exercise. Check A1C Vitamin D Def- check level and continue medications.  Skin ulcer- rule out cancer- send to skin center Hypothyroidism-check TSH level, continue medications the same.    Continue diet and meds as discussed. Further disposition pending results of labs.  HPI 79 y.o. female  presents for 3 month follow up with hypertension, hyperlipidemia, prediabetes and vitamin D.  Her blood pressure has been controlled at home, today their BP is BP: 122/70 mmHg She does workout. She denies chest pain, shortness of breath, dizziness.   She is not on cholesterol medication and denies myalgias. Her cholesterol is at goal. The cholesterol last visit was:   Lab Results  Component Value Date   CHOL 151 07/15/2013   HDL 70 07/15/2013   LDLCALC 69 07/15/2013   TRIG 61 07/15/2013   CHOLHDL 2.2 07/15/2013    She has been working on diet and exercise for prediabetes, she has been increasing fruits and veggies, and denies paresthesia of the feet, polydipsia, polyuria and visual disturbances. Last A1C in the office was:  Lab Results  Component Value Date   HGBA1C 6.1* 07/15/2013    Patient is on Vitamin D supplement, she is on 2 a day rather than 3 a day.    Lab Results  Component Value Date   VD25OH 107* 07/15/2013     She is on thyroid medication. Her medication was not changed last visit. Patient denies nervousness, palpitations and weight changes.  Lab Results  Component Value Date   TSH 0.431 07/15/2013  .  Has new area on right nose for 3-4 days, with bleeding,  No injury, no personal or family history of skin cancer.   Current Medications:  Current  Outpatient Prescriptions on File Prior to Visit  Medication Sig Dispense Refill  . aspirin 81 MG tablet Take 81 mg by mouth daily.      Marland Kitchen CALCIUM PO Take 1 tablet by mouth daily.      . Cholecalciferol (VITAMIN D) 2000 UNITS tablet Take 2,000 Units by mouth 3 (three) times daily.      . fish oil-omega-3 fatty acids 1000 MG capsule Take 2 g by mouth daily.      Marland Kitchen levothyroxine (SYNTHROID, LEVOTHROID) 100 MCG tablet Take 50-100 mcg by mouth daily. Take 100 mcg on Mon wed and fri and 1/2 tablet the rest of the week      . magnesium 30 MG tablet Take 30 mg by mouth 2 (two) times daily.      . Multiple Vitamin (MULTIVITAMIN) tablet Take 1 tablet by mouth daily.      . Red Yeast Rice 600 MG CAPS Take 600 mg by mouth daily.       . tamoxifen (NOLVADEX) 20 MG tablet Take 1 tablet (20 mg total) by mouth daily.  90 tablet  10  . vitamin C (ASCORBIC ACID) 500 MG tablet Take 500 mg by mouth daily.       No current facility-administered medications on file prior to visit.   Medical History:  Past Medical History  Diagnosis Date  . Breast cancer 08/2011    right  . Labile hypertension   . Hypothyroidism   . Prediabetes   .  Vitamin D deficiency   . Diverticulosis   . Thoracic aorta atherosclerosis     via CXR   Allergies: No Known Allergies   Review of Systems: [X]  = complains of  [ ]  = denies  General: Fatigue [ ]  Fever [ ]  Chills [ ]  Weakness [ ]   Insomnia [ ]  Eyes: Redness [ ]  Blurred vision [ ]  Diplopia [ ]   ENT: Congestion [ ]  Sinus Pain [ ]  Post Nasal Drip [ ]  Sore Throat [ ]  Earache [ ]   Cardiac: Chest pain/pressure [ ]  SOB [ ]  Orthopnea [ ]   Palpitations [ ]   Paroxysmal nocturnal dyspnea[ ]  Claudication [ ]  Edema [ ]   Pulmonary: Cough [ ]  Wheezing[ ]   SOB [ ]   Snoring [ ]   GI: Nausea [ ]  Vomiting[ ]  Dysphagia[ ]  Heartburn[ ]  Abdominal pain [ ]  Constipation [ ] ; Diarrhea [ ] ; BRBPR [ ]  Melena[ ]  GU: Hematuria[ ]  Dysuria [ ]  Nocturia[ ]  Urgency [ ]   Hesitancy [ ]  Discharge [ ]  Neuro:  Headaches[ ]  Vertigo[ ]  Paresthesias[ ]  Spasm [ ]  Speech changes [ ]  Incoordination [ ]   Ortho: Arthritis [ ]  Joint pain [ ]  Muscle pain [ ]  Joint swelling [ ]  Back Pain [ ]  Skin:  Rash [ ]   Pruritis [ ]  Change in skin lesion [ ]   Psych: Depression[ ]  Anxiety[ ]  Confusion [ ]  Memory loss [ ]   Heme/Lypmh: Bleeding [ ]  Bruising [ ]  Enlarged lymph nodes [ ]   Endocrine: Visual blurring [ ]  Paresthesia [ ]  Polyuria [ ]  Polydypsea [ ]    Heat/cold intolerance [ ]  Hypoglycemia [ ]   Family history- Review and unchanged Social history- Review and unchanged Physical Exam: BP 122/70  Pulse 56  Temp(Src) 97.9 F (36.6 C)  Resp 16  Ht 5' 0.5" (1.537 m)  Wt 142 lb (64.411 kg)  BMI 27.27 kg/m2 Wt Readings from Last 3 Encounters:  10/26/13 142 lb (64.411 kg)  07/15/13 147 lb 6.4 oz (66.86 kg)  06/30/13 150 lb (68.04 kg)   General Appearance: Well nourished, in no apparent distress. Eyes: PERRLA, EOMs, conjunctiva no swelling or erythema Sinuses: No Frontal/maxillary tenderness ENT/Mouth: Ext aud canals clear, TMs without erythema, bulging. No erythema, swelling, or exudate on post pharynx.  Tonsils not swollen or erythematous. Hearing normal.  Neck: Supple, thyroid normal.  Respiratory: Respiratory effort normal, BS equal bilaterally without rales, rhonchi, wheezing or stridor.  Cardio: RRR with systolic 2/6 murmur. Brisk peripheral pulses without edema.  Abdomen: Soft, + BS.  Non tender, no guarding, rebound, hernias, masses. Lymphatics: Non tender without lymphadenopathy.  Musculoskeletal: Full ROM, 5/5 strength, normal gait.  Skin: Warm, dry without rashes, lesions, ecchymosis. Right nose with ulcerative area 2x4 mm.  Neuro: Cranial nerves intact. Normal muscle tone, no cerebellar symptoms. Sensation intact.  Psych: Awake and oriented X 3, normal affect, Insight and Judgment appropriate.    Vicie Mutters, PA-C 11:27 AM [facility]

## 2013-10-27 LAB — BASIC METABOLIC PANEL WITH GFR
BUN: 18 mg/dL (ref 6–23)
CALCIUM: 9.5 mg/dL (ref 8.4–10.5)
CO2: 26 meq/L (ref 19–32)
Chloride: 104 mEq/L (ref 96–112)
Creat: 0.79 mg/dL (ref 0.50–1.10)
GFR, EST AFRICAN AMERICAN: 84 mL/min
GFR, Est Non African American: 72 mL/min
Glucose, Bld: 89 mg/dL (ref 70–99)
Potassium: 4.5 mEq/L (ref 3.5–5.3)
Sodium: 139 mEq/L (ref 135–145)

## 2013-10-27 LAB — LIPID PANEL
CHOL/HDL RATIO: 1.9 ratio
Cholesterol: 151 mg/dL (ref 0–200)
HDL: 78 mg/dL (ref >39)
LDL CALC: 62 mg/dL (ref 0–99)
Triglycerides: 57 mg/dL (ref <150)
VLDL: 11 mg/dL (ref 0–40)

## 2013-10-27 LAB — HEPATIC FUNCTION PANEL
ALK PHOS: 49 U/L (ref 39–117)
ALT: 8 U/L (ref 0–35)
AST: 25 U/L (ref 0–37)
Albumin: 3.5 g/dL (ref 3.5–5.2)
BILIRUBIN DIRECT: 0.1 mg/dL (ref 0.0–0.3)
BILIRUBIN INDIRECT: 0.2 mg/dL (ref 0.2–1.2)
BILIRUBIN TOTAL: 0.3 mg/dL (ref 0.2–1.2)
Total Protein: 6.6 g/dL (ref 6.0–8.3)

## 2013-10-27 LAB — INSULIN, FASTING: Insulin fasting, serum: 6.9 u[IU]/mL (ref 2.0–19.6)

## 2013-10-27 LAB — MAGNESIUM: Magnesium: 1.7 mg/dL (ref 1.5–2.5)

## 2013-10-27 LAB — VITAMIN D 25 HYDROXY (VIT D DEFICIENCY, FRACTURES): VIT D 25 HYDROXY: 103 ng/mL — AB (ref 30–89)

## 2013-10-27 LAB — TSH: TSH: 0.422 u[IU]/mL (ref 0.350–4.500)

## 2014-01-05 ENCOUNTER — Telehealth: Payer: Self-pay | Admitting: Hematology and Oncology

## 2014-01-05 NOTE — Telephone Encounter (Signed)
call day - moved 1/25 appt to 2/4. s/w pt she is aware.

## 2014-01-18 ENCOUNTER — Other Ambulatory Visit: Payer: Self-pay | Admitting: Internal Medicine

## 2014-01-29 ENCOUNTER — Other Ambulatory Visit: Payer: Medicare Other

## 2014-01-29 ENCOUNTER — Ambulatory Visit: Payer: Medicare Other | Admitting: Oncology

## 2014-01-29 ENCOUNTER — Ambulatory Visit: Payer: Self-pay | Admitting: Internal Medicine

## 2014-02-01 ENCOUNTER — Other Ambulatory Visit: Payer: Medicare Other

## 2014-02-01 ENCOUNTER — Ambulatory Visit: Payer: Medicare Other | Admitting: Hematology and Oncology

## 2014-02-01 ENCOUNTER — Ambulatory Visit: Payer: Medicare Other | Admitting: Oncology

## 2014-02-10 ENCOUNTER — Other Ambulatory Visit: Payer: Self-pay | Admitting: *Deleted

## 2014-02-10 DIAGNOSIS — C50519 Malignant neoplasm of lower-outer quadrant of unspecified female breast: Secondary | ICD-10-CM

## 2014-02-11 ENCOUNTER — Telehealth: Payer: Self-pay | Admitting: Hematology and Oncology

## 2014-02-11 ENCOUNTER — Other Ambulatory Visit (HOSPITAL_BASED_OUTPATIENT_CLINIC_OR_DEPARTMENT_OTHER): Payer: Medicare Other

## 2014-02-11 ENCOUNTER — Ambulatory Visit (HOSPITAL_BASED_OUTPATIENT_CLINIC_OR_DEPARTMENT_OTHER): Payer: Medicare Other | Admitting: Hematology and Oncology

## 2014-02-11 VITALS — BP 153/62 | HR 61 | Temp 98.4°F | Resp 18 | Ht 60.5 in | Wt 146.2 lb

## 2014-02-11 DIAGNOSIS — C50511 Malignant neoplasm of lower-outer quadrant of right female breast: Secondary | ICD-10-CM

## 2014-02-11 DIAGNOSIS — C50519 Malignant neoplasm of lower-outer quadrant of unspecified female breast: Secondary | ICD-10-CM

## 2014-02-11 DIAGNOSIS — Z170 Estrogen receptor positive status [ER+]: Secondary | ICD-10-CM

## 2014-02-11 DIAGNOSIS — D0511 Intraductal carcinoma in situ of right breast: Secondary | ICD-10-CM

## 2014-02-11 LAB — COMPREHENSIVE METABOLIC PANEL (CC13)
ALK PHOS: 66 U/L (ref 40–150)
ALT: 8 U/L (ref 0–55)
AST: 26 U/L (ref 5–34)
Albumin: 3.4 g/dL — ABNORMAL LOW (ref 3.5–5.0)
Anion Gap: 11 mEq/L (ref 3–11)
BUN: 21.6 mg/dL (ref 7.0–26.0)
CHLORIDE: 103 meq/L (ref 98–109)
CO2: 26 meq/L (ref 22–29)
Calcium: 9.1 mg/dL (ref 8.4–10.4)
Creatinine: 0.9 mg/dL (ref 0.6–1.1)
EGFR: 68 mL/min/{1.73_m2} — AB (ref >90)
GLUCOSE: 112 mg/dL (ref 70–140)
Potassium: 4.4 mEq/L (ref 3.5–5.1)
Sodium: 139 mEq/L (ref 136–145)
TOTAL PROTEIN: 7.2 g/dL (ref 6.4–8.3)
Total Bilirubin: 0.21 mg/dL (ref 0.20–1.20)

## 2014-02-11 LAB — CBC WITH DIFFERENTIAL/PLATELET
BASO%: 0.4 % (ref 0.0–2.0)
Basophils Absolute: 0 10*3/uL (ref 0.0–0.1)
EOS%: 2.7 % (ref 0.0–7.0)
Eosinophils Absolute: 0.2 10*3/uL (ref 0.0–0.5)
HEMATOCRIT: 40 % (ref 34.8–46.6)
HGB: 12.6 g/dL (ref 11.6–15.9)
LYMPH%: 36 % (ref 14.0–49.7)
MCH: 27.6 pg (ref 25.1–34.0)
MCHC: 31.4 g/dL — ABNORMAL LOW (ref 31.5–36.0)
MCV: 88 fL (ref 79.5–101.0)
MONO#: 1.1 10*3/uL — AB (ref 0.1–0.9)
MONO%: 13 % (ref 0.0–14.0)
NEUT#: 4 10*3/uL (ref 1.5–6.5)
NEUT%: 47.9 % (ref 38.4–76.8)
Platelets: 151 10*3/uL (ref 145–400)
RBC: 4.55 10*6/uL (ref 3.70–5.45)
RDW: 13.5 % (ref 11.2–14.5)
WBC: 8.3 10*3/uL (ref 3.9–10.3)
lymph#: 3 10*3/uL (ref 0.9–3.3)

## 2014-02-11 MED ORDER — TAMOXIFEN CITRATE 20 MG PO TABS: 20.0000 mg | ORAL_TABLET | Freq: Every day | ORAL | Status: DC

## 2014-02-11 NOTE — Assessment & Plan Note (Addendum)
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 Mammograms done June 2015 and normal 2. Breasts exam done 02/11/2014 is normal Return to clinic in 1 year for follow-up  Survivorship:Discussed the importance of physical exercise in decreasing the likelihood of breast cancer recurrence. Recommended 30 mins daily 6 days a week of either brisk walking or cycling or swimming. Encouraged patient to eat more fruits and vegetables and decrease red meat.

## 2014-02-11 NOTE — Progress Notes (Signed)
Patient Care Team: [name], MD as PCP - General (Internal Medicine)  DIAGNOSIS: Primary cancer of lower outer quadrant of right female breast   Staging form: Breast, AJCC 7th Edition     Clinical: Tis, N0, cM0 - Unsigned       Staging comments: Staged In Conference 6.19.13      Pathologic: No stage assigned - Unsigned   SUMMARY OF ONCOLOGIC HISTORY:   Primary cancer of lower outer quadrant of right female breast   08/13/2011 Surgery Right Breast DCIS intermediate grade, 2 foci 1.2 cm and 1.1 cm, ER 100%, PR 76% S/P mastectomy   09/12/2011 -  Anti-estrogen oral therapy Tamoxifen 20 mg daily Plan is for 5 years    CHIEF COMPLIANT: Follow-up of breast DCIS  INTERVAL HISTORY: Robin Huynh is a 80 year old lady with above-mentioned history of right-sided breast DCIS status post mastectomy she had multifocal disease followed by tamoxifen that started in September 2013. She has been tolerating tamoxifen extremely well without any side effects or concerns. She denies any new lumps or nodules. Denies any hot flashes muscle aches or pains.  REVIEW OF SYSTEMS:   Constitutional: Denies fevers, chills or abnormal weight loss Eyes: Denies blurriness of vision Ears, nose, mouth, throat, and face: Denies mucositis or sore throat Respiratory: Denies cough, dyspnea or wheezes Cardiovascular: Denies palpitation, chest discomfort or lower extremity swelling Gastrointestinal:  Denies nausea, heartburn or change in bowel habits Skin: Denies abnormal skin rashes Lymphatics: Denies new lymphadenopathy or easy bruising Neurological:Denies numbness, tingling or new weaknesses Behavioral/Psych: Mood is stable, no new changes  Breast:  denies any pain or lumps or nodules in either breasts All other systems were reviewed with the patient and are negative.  I have reviewed the past medical history, past surgical history, social history and family history with the patient and they are unchanged from  previous note.  ALLERGIES:  has No Known Allergies.  MEDICATIONS:  Current Outpatient Prescriptions  Medication Sig Dispense Refill  . aspirin 81 MG tablet Take 81 mg by mouth daily.    Marland Kitchen CALCIUM PO Take 1 tablet by mouth daily.    . Cholecalciferol (VITAMIN D) 2000 UNITS tablet Take 2,000 Units by mouth 3 (three) times daily.    . fish oil-omega-3 fatty acids 1000 MG capsule Take 2 g by mouth daily.    Marland Kitchen levothyroxine (SYNTHROID, LEVOTHROID) 100 MCG tablet Take 50-100 mcg by mouth daily. Take 100 mcg on Mon wed and fri and 1/2 tablet the rest of the week    . levothyroxine (SYNTHROID, LEVOTHROID) 50 MCG tablet TAKE ONE TABLET BY MOUTH EVERY DAY 90 tablet 0  . magnesium 30 MG tablet Take 30 mg by mouth 2 (two) times daily.    . Multiple Vitamin (MULTIVITAMIN) tablet Take 1 tablet by mouth daily.    . Red Yeast Rice 600 MG CAPS Take 600 mg by mouth daily.     . tamoxifen (NOLVADEX) 20 MG tablet Take 1 tablet (20 mg total) by mouth daily. 90 tablet 10  . vitamin C (ASCORBIC ACID) 500 MG tablet Take 500 mg by mouth daily.     No current facility-administered medications for this visit.    PHYSICAL EXAMINATION: ECOG PERFORMANCE STATUS: 0 - Asymptomatic  Filed Vitals:   02/11/14 1451  BP: 153/62  Pulse: 61  Temp: 98.4 F (36.9 C)  Resp: 18   Filed Weights   02/11/14 1451  Weight: 146 lb 3.2 oz (66.316 kg)    GENERAL:alert, no distress  and comfortable SKIN: skin color, texture, turgor are normal, no rashes or significant lesions EYES: normal, Conjunctiva are pink and non-injected, sclera clear OROPHARYNX:no exudate, no erythema and lips, buccal mucosa, and tongue normal  NECK: supple, thyroid normal size, non-tender, without nodularity LYMPH:  no palpable lymphadenopathy in the cervical, axillary or inguinal LUNGS: clear to auscultation and percussion with normal breathing effort HEART: regular rate & rhythm and no murmurs and no lower extremity edema ABDOMEN:abdomen soft,  non-tender and normal bowel sounds Musculoskeletal:no cyanosis of digits and no clubbing  NEURO: alert & oriented x 3 with fluent speech, no focal motor/sensory deficits BREAST: No palpable masses or nodules in either left breast. Right chest wall and axilla are without any nodularity No palpable axillary supraclavicular or infraclavicular adenopathy no breast tenderness or nipple discharge. (exam performed in the presence of a chaperone)  LABORATORY DATA:  I have reviewed the data as listed   Chemistry      Component Value Date/Time   NA 139 10/26/2013 1140   NA 139 11/26/2011 1129   K 4.5 10/26/2013 1140   K 4.0 11/26/2011 1129   CL 104 10/26/2013 1140   CL 106 11/26/2011 1129   CO2 26 10/26/2013 1140   CO2 28 11/26/2011 1129   BUN 18 10/26/2013 1140   BUN 18.0 11/26/2011 1129   CREATININE 0.79 10/26/2013 1140   CREATININE 0.9 11/26/2011 1129   CREATININE 0.90 08/11/2011 0641      Component Value Date/Time   CALCIUM 9.5 10/26/2013 1140   CALCIUM 9.6 11/26/2011 1129   ALKPHOS 49 10/26/2013 1140   ALKPHOS 62 11/26/2011 1129   AST 25 10/26/2013 1140   AST 28 11/26/2011 1129   ALT 8 10/26/2013 1140   ALT 11 11/26/2011 1129   BILITOT 0.3 10/26/2013 1140   BILITOT 0.33 11/26/2011 1129       Lab Results  Component Value Date   WBC 8.3 02/11/2014   HGB 12.6 02/11/2014   HCT 40.0 02/11/2014   MCV 88.0 02/11/2014   PLT 151 02/11/2014   NEUTROABS 4.0 02/11/2014     RADIOGRAPHIC STUDIES: I have personally reviewed the radiology reports and agreed with their findings. Mammograms June 2015 and normal  ASSESSMENT & PLAN:  Primary cancer of lower outer quadrant of right female breast Right breast DCIS intermediate grade ER/PR positive status post mastectomy currently on hormonal therapy with tamoxifen 20 mg daily since 2013  Tamoxifen toxicities: He does not have any side effects of tamoxifen Breast cancer surveillance: 1. Mammograms done June 2015 and normal 2. Breasts  exam done 02/11/2014 is normal Return to clinic in 1 year for follow-up  Survivorship:Discussed the importance of physical exercise in decreasing the likelihood of breast cancer recurrence. Recommended 30 mins daily 6 days a week of either brisk walking or cycling or swimming. Encouraged patient to eat more fruits and vegetables and decrease red meat.      Orders Placed This Encounter  Procedures  . CBC with Differential    Standing Status: Future     Number of Occurrences:      Standing Expiration Date: 02/11/2015  . Comprehensive metabolic panel (Cmet) - [facility]    Standing Status: Future     Number of Occurrences:      Standing Expiration Date: 02/11/2015   The patient has a good understanding of the overall plan. she agrees with it. She will call with any problems that may develop before her next visit here.   Rulon Eisenmenger, MD

## 2014-02-11 NOTE — Telephone Encounter (Signed)
per pof to sch pt appt-gave pt copy of sch °

## 2014-03-01 ENCOUNTER — Encounter: Payer: Self-pay | Admitting: Internal Medicine

## 2014-03-01 ENCOUNTER — Ambulatory Visit (INDEPENDENT_AMBULATORY_CARE_PROVIDER_SITE_OTHER): Payer: Medicare Other | Admitting: Internal Medicine

## 2014-03-01 VITALS — BP 116/64 | HR 68 | Temp 97.5°F | Resp 16 | Ht 60.0 in | Wt 148.0 lb

## 2014-03-01 DIAGNOSIS — Z9181 History of falling: Secondary | ICD-10-CM

## 2014-03-01 DIAGNOSIS — R0989 Other specified symptoms and signs involving the circulatory and respiratory systems: Secondary | ICD-10-CM

## 2014-03-01 DIAGNOSIS — C50511 Malignant neoplasm of lower-outer quadrant of right female breast: Secondary | ICD-10-CM

## 2014-03-01 DIAGNOSIS — Z23 Encounter for immunization: Secondary | ICD-10-CM

## 2014-03-01 DIAGNOSIS — Z79899 Other long term (current) drug therapy: Secondary | ICD-10-CM

## 2014-03-01 DIAGNOSIS — E039 Hypothyroidism, unspecified: Secondary | ICD-10-CM

## 2014-03-01 DIAGNOSIS — Z0000 Encounter for general adult medical examination without abnormal findings: Secondary | ICD-10-CM

## 2014-03-01 DIAGNOSIS — R7303 Prediabetes: Secondary | ICD-10-CM

## 2014-03-01 DIAGNOSIS — I10 Essential (primary) hypertension: Secondary | ICD-10-CM

## 2014-03-01 DIAGNOSIS — E782 Mixed hyperlipidemia: Secondary | ICD-10-CM

## 2014-03-01 DIAGNOSIS — E559 Vitamin D deficiency, unspecified: Secondary | ICD-10-CM

## 2014-03-01 DIAGNOSIS — Z1331 Encounter for screening for depression: Secondary | ICD-10-CM

## 2014-03-01 LAB — CBC WITH DIFFERENTIAL/PLATELET
BASOS ABS: 0 10*3/uL (ref 0.0–0.1)
Basophils Relative: 0 % (ref 0–1)
EOS ABS: 0.3 10*3/uL (ref 0.0–0.7)
Eosinophils Relative: 4 % (ref 0–5)
HCT: 38.2 % (ref 36.0–46.0)
Hemoglobin: 12.7 g/dL (ref 12.0–15.0)
LYMPHS ABS: 2.5 10*3/uL (ref 0.7–4.0)
Lymphocytes Relative: 37 % (ref 12–46)
MCH: 28.5 pg (ref 26.0–34.0)
MCHC: 33.2 g/dL (ref 30.0–36.0)
MCV: 85.7 fL (ref 78.0–100.0)
MONO ABS: 0.7 10*3/uL (ref 0.1–1.0)
MONOS PCT: 11 % (ref 3–12)
MPV: 11.5 fL (ref 8.6–12.4)
NEUTROS ABS: 3.3 10*3/uL (ref 1.7–7.7)
Neutrophils Relative %: 48 % (ref 43–77)
Platelets: 155 10*3/uL (ref 150–400)
RBC: 4.46 MIL/uL (ref 3.87–5.11)
RDW: 14.1 % (ref 11.5–15.5)
WBC: 6.8 10*3/uL (ref 4.0–10.5)

## 2014-03-01 LAB — HEMOGLOBIN A1C
Hgb A1c MFr Bld: 6.6 % — ABNORMAL HIGH (ref <5.7)
MEAN PLASMA GLUCOSE: 143 mg/dL — AB (ref <117)

## 2014-03-01 NOTE — Patient Instructions (Signed)

## 2014-03-01 NOTE — Progress Notes (Signed)
Patient ID: Robin Huynh, female   DOB: 02-29-1934, 80 y.o.   MRN: [id number]  MEDICARE ANNUAL WELLNESS VISIT AND OV  Assessment:   1. Labile hypertension (2000)  - TSH  2. Hyperlipidemia  - Lipid panel  3. Prediabetes (06/2009)  - Hemoglobin A1c - Insulin, fasting  4. Hypothyroidism, unspecified hypothyroidism type   5. Vitamin D deficiency  - Vit D  25 hydroxy (rtn osteoporosis monitoring)  6. Primary cancer of lower outer quadrant of right female breast   7. Screening for depression   8. At low risk for fall   9. Encounter for long-term (current) use of medications  - CBC with Differential/Platelet - BASIC METABOLIC PANEL WITH GFR - Hepatic function panel - Magnesium  10. Routine general medical examination at a health care facility   11. Need for prophylactic vaccination against Streptococcus pneumoniae (pneumococcus)  - Pneumococcal conjugate vaccine 13-valent   Plan:   During the course of the visit the patient was educated and counseled about appropriate screening and preventive services including:    Pneumococcal vaccine   Influenza vaccine  Td vaccine  Screening electrocardiogram  Bone densitometry screening  Colorectal cancer screening  Diabetes screening  Glaucoma screening  Nutrition counseling   Advanced directives: requested  Screening recommendations, referrals: Vaccinations:  Immunization History  Administered Date(s) Administered  . Pneumococcal Conjugate-13 03/01/2014  . Pneumococcal Polysaccharide-23 01/09/2007  . Td 05/18/2008  Influenza vaccine refuses Shingles vaccine 2008 Hep B vaccine not indicated  Nutrition assessed and recommended  Colonoscopy 08/18/2012 Recommended yearly ophthalmology/optometry visit for glaucoma screening and checkup Recommended yearly dental visit for hygiene and checkup Advanced directives - yes  Conditions/risks identified: BMI: Discussed weight loss, diet, and increase  physical activity.  Increase physical activity: AHA recommends 150 minutes of physical activity a week.  Medications reviewed PreDiabetes is not at goal, ACE/ARB therapy: Not indicated Urinary Incontinence is not an issue: discussed non pharmacology and pharmacology options.  Fall risk: low- discussed PT, home fall assessment, medications.   Subjective:   Robin Huynh i presents for [facility] Annual Wellness Visit and OV.  Date of last medicare wellness visit is unknown. This very nice 80 y.o. MBF presents for 3 month follow up with Hypertension, Hyperlipidemia, Pre-Diabetes and Vitamin D Deficiency.    Patient is treated for HTN & BP has been controlled at home. Today's BP: 116/64 mmHg. Patient has had no complaints of any cardiac type chest pain, palpitations, dyspnea/orthopnea/PND, dizziness, claudication, or dependent edema.   Hyperlipidemia is controlled with diet & meds. Patient denies myalgias or other med SE's. Last Lipids were at goal - Total  Chol 151; HDL  78; LDL  62; Trig 57 on 10/26/2013.   Also, the patient has history of T2_NIDDM PreDiabetes and has had no symptoms of reactive hypoglycemia, diabetic polys, paresthesias or visual blurring.  Last A1c was 6.4% on  10/26/2013.   Further, the patient also has history of Vitamin D Deficiency and supplements vitamin D without any suspected side-effects. Last vitamin D was  103 on  10/26/2013.     Names of Other Physician/Practitioners you currently use: 1. [facility] here for primary care 2. [facility], eye doctor, last visit 2015 3. Dr Toy Cookey, [place], dentist, last visit 2015 - q49mo  Patient Care Team: [name], MD as PCP - General (Internal Medicine) Rulon Eisenmenger, MD as Consulting Physician (Hematology and Oncology) Sable Feil, MD as Consulting Physician (Gastroenterology) Robin Keens, MD as Consulting Physician (General  Surgery)  Medication Review: Medication Sig   . aspirin 81 MG tablet Take 81 mg by mouth daily.  Marland Kitchen CALCIUM PO Take 1 tablet by mouth daily.  Marland Kitchen VITAMIN D 2000 UNITS tablet Take 2,000 Units by mouth 3 (three) times daily.  . fish oil 1000 MG capsule Take 2 g by mouth daily.  Marland Kitchen levothyroxine  100 MCG tablet  Take 100 mcg on MWF and 1/2 tablet the rest of the week  . magnesium 30 MG tablet Take 30 mg by mouth 2 (two) times daily.  . Multiple Vitamin  Take 1 tablet by mouth daily.  . Red Yeast Rice 600 MG CAPS Take 600 mg by mouth daily.   . tamoxifen  20 MG tablet Take 1 tablet (20 mg total) by mouth daily.  . vitamin C 500 MG tablet Take 500 mg by mouth daily.   Current Problems (verified) Patient Active Problem List   Diagnosis Date Noted  . Hyperlipidemia 07/15/2013  . Encounter for long-term (current) use of medications 06/30/2013  . Labile hypertension (2000)   . Hypothyroidism   . Prediabetes (06/2009)   . Vitamin D deficiency   . Diverticulosis   . Primary cancer of lower outer quadrant of right female breast 06/21/2011    Screening Tests Health Maintenance  Topic Date Due  . ZOSTAVAX  04/29/1996  . DEXA SCAN  04/29/2001  . INFLUENZA VACCINE  08/08/2013  . TETANUS/TDAP  05/19/2018  . COLONOSCOPY  08/19/2022  . PNEUMOCOCCAL POLYSACCHARIDE VACCINE AGE 76 AND OVER  Completed    Immunization History  Administered Date(s) Administered  . Pneumococcal Conjugate-13 03/01/2014  . Pneumococcal Polysaccharide-23 01/09/2007  . Td 05/18/2008    Preventative care: Last colonoscopy: 08/18/2012  History reviewed: allergies, current medications, past family history, past medical history, past social history, past surgical history and problem list  Risk Factors: Tobacco History  Substance Use Topics  . Smoking status: Never Smoker   . Smokeless tobacco: Never Used  . Alcohol Use: No   She does not smoke.  Patient is not a former smoker. Are there smokers in your home (other than you)?  No Alcohol Current alcohol use:  none  Caffeine Current caffeine use: coffee 2cups /day  Exercise Current exercise: cardiovascular workout on exercise equipment and walking  Nutrition/Diet Current diet: in general, a "healthy" diet    Cardiac risk factors: advanced age (older than 76 for men, 38 for women), dyslipidemia and hypertension.  Depression Screen (Note: if answer to either of the following is "Yes", a more complete depression screening is indicated)   Q1: Over the past two weeks, have you felt down, depressed or hopeless? No  Q2: Over the past two weeks, have you felt little interest or pleasure in doing things? No  Have you lost interest or pleasure in daily life? No  Do you often feel hopeless? No  Do you cry easily over simple problems? No  Activities of Daily Living In your present state of health, do you have any difficulty performing the following activities?:  Driving? No Managing money?  No Feeding yourself? No Getting from bed to chair? No Climbing a flight of stairs? No Preparing food and eating?: No Bathing or showering? No Getting dressed: No Getting to the toilet? No Using the toilet:No Moving around from place to place: No In the past year have you fallen or had a near fall?:No   Are you sexually active?  No  Do you have more than one partner?  No  Vision Difficulties: No  Hearing Difficulties: No Do you often ask people to speak up or repeat themselves? No Do you experience ringing or noises in your ears? No Do you have difficulty understanding soft or whispered voices? Sometimes.  Cognition  Do you feel that you have a problem with memory?No  Do you often misplace items? No  Do you feel safe at home?  Yes  Advanced directives Does patient have a [place]? Yes Does patient have a Living Will? Yes  Past Medical History  Diagnosis Date  . Breast cancer 08/2011    right  . Labile hypertension   . Hypothyroidism   . Prediabetes   . Vitamin D  deficiency   . Diverticulosis   . Thoracic aorta atherosclerosis     via CXR   Past Surgical History  Procedure Laterality Date  . Breast surgery      right breast  . Colonoscopy    . Mastectomy complete / simple w/ sentinel node biopsy  08/10/11    right  . Mastectomy w/ sentinel node biopsy  08/10/2011    Procedure: MASTECTOMY WITH SENTINEL LYMPH NODE BIOPSY;  Surgeon: Harl Bowie, MD;  Location: [facility];  Service: General;  Laterality: Right;    ROS: Constitutional: Denies fever, chills, weight loss/gain, headaches, insomnia, fatigue, night sweats, and change in appetite. Eyes: Denies redness, blurred vision, diplopia, discharge, itchy, watery eyes.  ENT: Denies discharge, congestion, post nasal drip, epistaxis, sore throat, earache, hearing loss, dental pain, Tinnitus, Vertigo, Sinus pain, snoring.  Cardio: Denies chest pain, palpitations, irregular heartbeat, syncope, dyspnea, diaphoresis, orthopnea, PND, claudication, edema Respiratory: denies cough, dyspnea, DOE, pleurisy, hoarseness, laryngitis, wheezing.  Gastrointestinal: Denies dysphagia, heartburn, reflux, water brash, pain, cramps, nausea, vomiting, bloating, diarrhea, constipation, hematemesis, melena, hematochezia, jaundice, hemorrhoids Genitourinary: Denies dysuria, frequency, urgency, nocturia, hesitancy, discharge, hematuria, flank pain Breast: Breast lumps, nipple discharge, bleeding.  Musculoskeletal: Denies arthralgia, myalgia, stiffness, Jt. Swelling, pain, limp, and strain/sprain. Denies falls. Skin: Denies puritis, rash, hives, warts, acne, eczema, changing in skin lesion Neuro: No weakness, tremor, incoordination, spasms, paresthesia, pain Psychiatric: Denies confusion, memory loss, sensory loss. Denies Depression. Endocrine: Denies change in weight, skin, hair change, nocturia, and paresthesia, diabetic polys, visual blurring, hyper / hypo glycemic episodes.  Heme/Lymph: No excessive bleeding, bruising,  enlarged lymph nodes  Objective:     BP 116/64 mmHg  Pulse 68  Temp(Src) 97.5 F (36.4 C)  Resp 16  Ht 5' (1.524 m)  Wt 148 lb (67.132 kg)  BMI 28.90 kg/m2  General Appearance: Well nourished, alert, WD/WN, female and in no apparent distress. Eyes: PERRLA, EOMs, conjunctiva no swelling or erythema, normal fundi and vessels. Sinuses: No frontal/maxillary tenderness ENT/Mouth: EACs patent / TMs  nl. Nares clear without erythema, swelling, mucoid exudates. Oral hygiene is good. No erythema, swelling, or exudate. Tongue normal, non-obstructing. Tonsils not swollen or erythematous. Hearing normal.  Neck: Supple, thyroid normal. No bruits, nodes or JVD. Respiratory: Respiratory effort normal.  BS equal and clear bilateral without rales, rhonci, wheezing or stridor. Cardio: Heart sounds are normal with regular rate and rhythm and no murmurs, rubs or gallops. Peripheral pulses are normal and equal bilaterally without edema. No aortic or femoral bruits. Chest: symmetric with normal excursions and percussion. Breasts: Symmetric, without lumps, nipple discharge, retractions, or fibrocystic changes.  Abdomen: Flat, soft  with nl bowel sounds. Nontender, no guarding, rebound, hernias, masses, or organomegaly.  Lymphatics: Non tender without lymphadenopathy.  Genitourinary:  Musculoskeletal: Full ROM all  peripheral extremities, joint stability, 5/5 strength, and normal gait. Skin: Warm and dry without rashes, lesions, cyanosis, clubbing or  ecchymosis.  Neuro: Cranial nerves intact, reflexes equal bilaterally. Normal muscle tone, no cerebellar symptoms. Sensation intact.  Pysch: Awake and oriented X 3, normal affect, Insight and Judgment appropriate.   Cognitive Testing  Alert? Yes  Normal Appearance?Yes  Oriented to person? Yes  Place? Yes   Time? Yes  Recall of three objects?  Yes  Can perform simple calculations? Yes  Displays appropriate judgment? Yes  Can read the correct time from a  watch/clock?Yes  Medicare Attestation I have personally reviewed: The patient's medical and social history Their use of alcohol, tobacco or illicit drugs Their current medications and supplements The patient's functional ability including ADLs,fall risks, home safety risks, cognitive, and hearing and visual impairment Diet and physical activities Evidence for depression or mood disorders  The patient's weight, height, BMI, and visual acuity have been recorded in the chart.  I have made referrals, counseling, and provided education to the patient based on review of the above and I have provided the patient with a written personalized care plan for preventive services.    Shaquilla Kehres DAVID, MD   03/01/2014

## 2014-03-02 LAB — INSULIN, FASTING: Insulin fasting, serum: 6.4 u[IU]/mL (ref 2.0–19.6)

## 2014-03-02 LAB — BASIC METABOLIC PANEL WITH GFR
BUN: 19 mg/dL (ref 6–23)
CO2: 29 mEq/L (ref 19–32)
CREATININE: 0.75 mg/dL (ref 0.50–1.10)
Calcium: 9.3 mg/dL (ref 8.4–10.5)
Chloride: 102 mEq/L (ref 96–112)
GFR, EST AFRICAN AMERICAN: 89 mL/min
GFR, EST NON AFRICAN AMERICAN: 77 mL/min
Glucose, Bld: 96 mg/dL (ref 70–99)
POTASSIUM: 4.3 meq/L (ref 3.5–5.3)
SODIUM: 138 meq/L (ref 135–145)

## 2014-03-02 LAB — HEPATIC FUNCTION PANEL
ALBUMIN: 3.7 g/dL (ref 3.5–5.2)
ALT: <8 U/L (ref 0–35)
AST: 23 U/L (ref 0–37)
Alkaline Phosphatase: 59 U/L (ref 39–117)
Bilirubin, Direct: 0.1 mg/dL (ref 0.0–0.3)
Indirect Bilirubin: 0.2 mg/dL (ref 0.2–1.2)
Total Bilirubin: 0.3 mg/dL (ref 0.2–1.2)
Total Protein: 6.8 g/dL (ref 6.0–8.3)

## 2014-03-02 LAB — LIPID PANEL
CHOL/HDL RATIO: 1.7 ratio
Cholesterol: 156 mg/dL (ref 0–200)
HDL: 90 mg/dL (ref >=46)
LDL CALC: 52 mg/dL (ref 0–99)
TRIGLYCERIDES: 68 mg/dL (ref <150)
VLDL: 14 mg/dL (ref 0–40)

## 2014-03-02 LAB — TSH: TSH: 0.551 u[IU]/mL (ref 0.350–4.500)

## 2014-03-02 LAB — MAGNESIUM: MAGNESIUM: 1.8 mg/dL (ref 1.5–2.5)

## 2014-03-02 LAB — VITAMIN D 25 HYDROXY (VIT D DEFICIENCY, FRACTURES): Vit D, 25-Hydroxy: 84 ng/mL (ref 30–100)

## 2014-05-27 ENCOUNTER — Other Ambulatory Visit: Payer: Self-pay | Admitting: Internal Medicine

## 2014-06-14 ENCOUNTER — Encounter: Payer: Self-pay | Admitting: Physician Assistant

## 2014-06-14 ENCOUNTER — Ambulatory Visit (INDEPENDENT_AMBULATORY_CARE_PROVIDER_SITE_OTHER): Payer: Medicare Other | Admitting: Physician Assistant

## 2014-06-14 VITALS — BP 118/62 | HR 60 | Temp 97.7°F | Resp 16 | Ht 60.0 in | Wt 144.0 lb

## 2014-06-14 DIAGNOSIS — E559 Vitamin D deficiency, unspecified: Secondary | ICD-10-CM

## 2014-06-14 DIAGNOSIS — R7309 Other abnormal glucose: Secondary | ICD-10-CM

## 2014-06-14 DIAGNOSIS — E039 Hypothyroidism, unspecified: Secondary | ICD-10-CM

## 2014-06-14 DIAGNOSIS — Z79899 Other long term (current) drug therapy: Secondary | ICD-10-CM

## 2014-06-14 DIAGNOSIS — E782 Mixed hyperlipidemia: Secondary | ICD-10-CM

## 2014-06-14 DIAGNOSIS — I10 Essential (primary) hypertension: Secondary | ICD-10-CM

## 2014-06-14 DIAGNOSIS — R0989 Other specified symptoms and signs involving the circulatory and respiratory systems: Secondary | ICD-10-CM

## 2014-06-14 DIAGNOSIS — Z1331 Encounter for screening for depression: Secondary | ICD-10-CM

## 2014-06-14 DIAGNOSIS — R7303 Prediabetes: Secondary | ICD-10-CM

## 2014-06-14 NOTE — Progress Notes (Signed)
Assessment and Plan:  1. Hypertension -Continue medication, monitor blood pressure at home. Continue DASH diet.  Reminder to go to the ER if any CP, SOB, nausea, dizziness, severe HA, changes vision/speech, left arm numbness and tingling and jaw pain.  2. Cholesterol -Continue diet and exercise. Check cholesterol.   3. Prediabetes  -Continue diet and exercise. Check A1C  4. Vitamin D Def - check level and continue medications.   5. Hypothyroidism -check TSH level, continue medications the same, reminded to take on an empty stomach 30-30mins before food.   6. Depression screening negative  Continue diet and meds as discussed. Further disposition pending results of labs. Over 30 minutes of exam, counseling, chart review, and critical decision making was performed  HPI 80 y.o. female  presents for 3 month follow up on hypertension, cholesterol, prediabetes, and vitamin D deficiency.   Her blood pressure has been controlled at home, today their BP is BP: 118/62 mmHg  She does workout, goes to the [employer] 3 x a week and walks in between. She denies chest pain, shortness of breath, dizziness.  She is not on cholesterol medication and denies myalgias. Her cholesterol is at goal. The cholesterol last visit was:   Lab Results  Component Value Date   CHOL 156 03/01/2014   HDL 90 03/01/2014   LDLCALC 52 03/01/2014   TRIG 68 03/01/2014   CHOLHDL 1.7 03/01/2014    She has been working on diet and exercise for prediabetes but last visit she was in the DM range, and denies paresthesia of the feet, polydipsia, polyuria and visual disturbances. Last A1C in the office was:  Lab Results  Component Value Date   HGBA1C 6.6* 03/01/2014   Wt Readings from Last 3 Encounters:  06/14/14 144 lb (65.318 kg)  03/01/14 148 lb (67.132 kg)  02/11/14 146 lb 3.2 oz (66.316 kg)   Patient is on Vitamin D supplement.   Lab Results  Component Value Date   VD25OH 83 03/01/2014     She is on thyroid  medication. Her medication was not changed last visit.   Lab Results  Component Value Date   TSH 0.551 03/01/2014     Current Medications:  Current Outpatient Prescriptions on File Prior to Visit  Medication Sig Dispense Refill  . aspirin 81 MG tablet Take 81 mg by mouth daily.    Marland Kitchen CALCIUM PO Take 1 tablet by mouth daily.    . Cholecalciferol (VITAMIN D) 2000 UNITS tablet Take 2,000 Units by mouth 3 (three) times daily.    . fish oil-omega-3 fatty acids 1000 MG capsule Take 2 g by mouth daily.    Marland Kitchen levothyroxine (SYNTHROID, LEVOTHROID) 100 MCG tablet Take 50-100 mcg by mouth daily. Take 100 mcg on Mon wed and fri and 1/2 tablet the rest of the week    . levothyroxine (SYNTHROID, LEVOTHROID) 50 MCG tablet TAKE ONE TABLET BY MOUTH ONCE DAILY 90 tablet 0  . magnesium 30 MG tablet Take 30 mg by mouth 2 (two) times daily.    . Multiple Vitamin (MULTIVITAMIN) tablet Take 1 tablet by mouth daily.    . Red Yeast Rice 600 MG CAPS Take 600 mg by mouth daily.     . tamoxifen (NOLVADEX) 20 MG tablet Take 1 tablet (20 mg total) by mouth daily. 90 tablet 10  . vitamin C (ASCORBIC ACID) 500 MG tablet Take 500 mg by mouth daily.     No current facility-administered medications on file prior to visit.   Medical  History:  Past Medical History  Diagnosis Date  . Breast cancer 08/2011    right  . Labile hypertension   . Hypothyroidism   . Prediabetes   . Vitamin D deficiency   . Diverticulosis   . Thoracic aorta atherosclerosis     via CXR   Allergies: No Known Allergies   Review of Systems:  Review of Systems  Constitutional: Negative.   HENT: Negative.   Eyes: Negative.   Respiratory: Negative.  Negative for shortness of breath.   Cardiovascular: Negative.  Negative for chest pain.  Gastrointestinal: Negative.   Genitourinary: Negative.   Musculoskeletal: Negative.  Negative for falls.  Skin: Negative.   Neurological: Negative.   Endo/Heme/Allergies: Negative.    Psychiatric/Behavioral: Negative.  Negative for depression.    Family history- Review and unchanged Social history- Review and unchanged Physical Exam: BP 118/62 mmHg  Pulse 60  Temp(Src) 97.7 F (36.5 C)  Resp 16  Ht 5' (1.524 m)  Wt 144 lb (65.318 kg)  BMI 28.12 kg/m2 Wt Readings from Last 3 Encounters:  06/14/14 144 lb (65.318 kg)  03/01/14 148 lb (67.132 kg)  02/11/14 146 lb 3.2 oz (66.316 kg)   General Appearance: Well nourished, in no apparent distress. Eyes: PERRLA, EOMs, conjunctiva no swelling or erythema Sinuses: No Frontal/maxillary tenderness ENT/Mouth: Ext aud canals clear, TMs without erythema, bulging. No erythema, swelling, or exudate on post pharynx.  Tonsils not swollen or erythematous. Hearing normal.  Neck: Supple, thyroid normal.  Respiratory: Respiratory effort normal, BS equal bilaterally without rales, rhonchi, wheezing or stridor.  Cardio: RRR with no MRGs. Brisk peripheral pulses without edema.  Abdomen: Soft, + BS,  Non tender, no guarding, rebound, hernias, masses. Lymphatics: Non tender without lymphadenopathy.  Musculoskeletal: Full ROM, 5/5 strength, Normal gait Skin: Warm, dry without rashes, lesions, ecchymosis.  Neuro: Cranial nerves intact. Normal muscle tone, no cerebellar symptoms. Psych: Awake and oriented X 3, normal affect, Insight and Judgment appropriate.    Vicie Mutters, PA-C 11:05 AM [facility]

## 2014-06-14 NOTE — Patient Instructions (Signed)
Diabetes is a very complicated disease...lets simplify it.  An easy way to look at it to understand the complications is if you think of the extra sugar floating in your blood stream as glass shards floating through your blood stream.    Diabetes affects your small vessels first: 1) The glass shards (sugar) scraps down the tiny blood vessels in your eyes and lead to diabetic retinopathy, the leading cause of blindness in the [place]. Diabetes is the leading cause of newly diagnosed adult (33 to 78 years of age) blindness in the [place].  2) The glass shards scratches down the tiny vessels of your legs leading to nerve damage called neuropathy and can lead to amputations of your feet. More than 60% of all non-traumatic amputations of lower limbs occur in people with diabetes.  3) Over time the small vessels in your brain are shredded and closed off, individually this does not cause any problems but over a long period of time many of the small vessels being blocked can lead to Vascular Dementia.   4) Your kidney's are a filter system and have a "net" that keeps certain things in the body and lets bad things out. Sugar shreds this net and leads to kidney damage and eventually failure. Decreasing the sugar that is destroying the net and certain blood pressure medications can help stop or decrease progression of kidney disease. Diabetes was the primary cause of kidney failure in 44 percent of all new cases in 2011.  5) Diabetes also destroys the small vessels in your penis that lead to erectile dysfunction. Eventually the vessels are so damaged that you may not be responsive to cialis or viagra.   Diabetes and your large vessels: Your larger vessels consist of your coronary arteries in your heart and the carotid vessels to your brain. Diabetes or even increased sugars put you at 300% increased risk of heart attack and stroke and this is why.. The sugar scrapes down your large blood vessels and your body  sees this as an internal injury and tries to repair itself. Just like you get a scab on your skin, your platelets will stick to the blood vessel wall trying to heal it. This is why we have diabetics on low dose aspirin daily, this prevents the platelets from sticking and can prevent plaque formation. In addition, your body takes cholesterol and tries to shove it into the open wound. This is why we want your LDL, or bad cholesterol, below 70.   The combination of platelets and cholesterol over 5-10 years forms plaque that can break off and cause a heart attack or stroke.   PLEASE REMEMBER:  Diabetes is preventable! Up to 40 percent of complications and morbidities among individuals with type 2 diabetes can be prevented, delayed, or effectively treated and minimized with regular visits to a health professional, appropriate monitoring and medication, and a healthy diet and lifestyle.   Your A1C is a measure of your sugar over the past 3 months and is not affected by what you have eaten over the past few days. Diabetes increases your chances of stroke and heart attack over 300 % and is the leading cause of blindness and kidney failure in the [place]. Please make sure you decrease bad carbs like white bread, white rice, potatoes, corn, soft drinks, pasta, cereals, refined sugars, sweet tea, dried fruits, and fruit juice. Good carbs are okay to eat in moderation like sweet potatoes, brown rice, whole grain pasta/bread, most fruit (except  dried fruit) and you can eat as many veggies as you want.   Greater than 6.5 is considered diabetic. Between 6.4 and 5.7 is prediabetic If your A1C is less than 5.7 you are NOT diabetic.  Targets for Glucose Readings: Time of Check Target for patients WITHOUT Diabetes Target for DIABETICS  Before Meals Less than 100  less than 150  Two hours after meals Less than 200  Less than 250    .   Bad carbs also include fruit juice, alcohol, and sweet tea. These are  empty calories that do not signal to your brain that you are full.   Please remember the good carbs are still carbs which convert into sugar. So please measure them out no more than 1/2-1 cup of rice, oatmeal, pasta, and beans  Veggies are however free foods! Pile them on.   Not all fruit is created equal. Please see the list below, the fruit at the bottom is higher in sugars than the fruit at the top. Please avoid all dried fruits.     Before you even begin to attack a weight-loss plan, it pays to remember this: You are not fat. You have fat. Losing weight isn't about blame or shame; it's simply another achievement to accomplish. Dieting is like any other skill-you have to buckle down and work at it. As long as you act in a smart, reasonable way, you'll ultimately get where you want to be. Here are some weight loss pearls for you.  1. It's Not a Diet. It's a Lifestyle Thinking of a diet as something you're on and suffering through only for the short term doesn't work. To shed weight and keep it off, you need to make permanent changes to the way you eat. It's OK to indulge occasionally, of course, but if you cut calories temporarily and then revert to your old way of eating, you'll gain back the weight quicker than you can say yo-yo. Use it to lose it. Research shows that one of the best predictors of long-term weight loss is how many pounds you drop in the first month. For that reason, nutritionists often suggest being stricter for the first two weeks of your new eating strategy to build momentum. Cut out added sugar and alcohol and avoid unrefined carbs. After that, figure out how you can reincorporate them in a way that's healthy and maintainable.  2. There's a Right Way to Exercise Working out burns calories and fat and boosts your metabolism by building muscle. But those trying to lose weight are notorious for overestimating the number of calories they burn and underestimating the amount they take  in. Unfortunately, your system is biologically programmed to hold on to extra pounds and that means when you start exercising, your body senses the deficit and ramps up its hunger signals. If you're not diligent, you'll eat everything you burn and then some. Use it to lose it. Cardio gets all the exercise glory, but strength and interval training are the real heroes. They help you build lean muscle, which in turn increases your metabolism and calorie-burning ability 3. Don't Overreact to Mild Hunger Some people have a hard time losing weight because of hunger anxiety. To them, being hungry is bad-something to be avoided at all costs-so they carry snacks with them and eat when they don't need to. Others eat because they're stressed out or bored. While you never want to get to the point of being ravenous (that's when bingeing is likely to happen),  a hunger pang, a craving, or the fact that it's 3:00 p.m. should not send you racing for the vending machine or obsessing about the energy bar in your purse. Ideally, you should put off eating until your stomach is growling and it's difficult to concentrate.  Use it to lose it. When you feel the urge to eat, use the HALT method. Ask yourself, Am I really hungry? Or am I angry or anxious, lonely or bored, or tired? If you're still not certain, try the apple test. If you're truly hungry, an apple should seem delicious; if it doesn't, something else is going on. Or you can try drinking water and making yourself busy, if you are still hungry try a healthy snack.  4. Not All Calories Are Created Equal The mechanics of weight loss are pretty simple: Take in fewer calories than you use for energy. But the kind of food you eat makes all the difference. Processed food that's high in saturated fat and refined starch or sugar can cause inflammation that disrupts the hormone signals that tell your brain you're full. The result: You eat a lot more.  Use it to lose it. Clean up  your diet. Swap in whole, unprocessed foods, including vegetables, lean protein, and healthy fats that will fill you up and give you the biggest nutritional bang for your calorie buck. In a few weeks, as your brain starts receiving regular hunger and fullness signals once again, you'll notice that you feel less hungry overall and naturally start cutting back on the amount you eat.  5. Protein, Produce, and Plant-Based Fats Are Your Weight-Loss Trinity Here's why eating the three Ps regularly will help you drop pounds. Protein fills you up. You need it to build lean muscle, which keeps your metabolism humming so that you can torch more fat. People in a weight-loss program who ate double the recommended daily allowance for protein (about 110 grams for a 150-pound woman) lost 70 percent of their weight from fat, while people who ate the RDA lost only about 40 percent, one study found. Produce is packed with filling fiber. "It's very difficult to consume too many calories if you're eating a lot of vegetables. Example: Three cups of broccoli is a lot of food, yet only 93 calories. (Fruit is another story. It can be easy to overeat and can contain a lot of calories from sugar, so be sure to monitor your intake.) Plant-based fats like olive oil and those in avocados and nuts are healthy and extra satiating.  Use it to lose it. Aim to incorporate each of the three Ps into every meal and snack. People who eat protein throughout the day are able to keep weight off, according to a study in the [place] of Clinical Nutrition. In addition to meat, poultry and seafood, good sources are beans, lentils, eggs, tofu, and yogurt. As for fat, keep portion sizes in check by measuring out salad dressing, oil, and nut butters (shoot for one to two tablespoons). Finally, eat veggies or a little fruit at every meal. People who did that consumed 308 fewer calories but didn't feel any hungrier than when they didn't eat more  produce.  7. How You Eat Is As Important As What You Eat In order for your brain to register that you're full, you need to focus on what you're eating. Sit down whenever you eat, preferably at a table. Turn off the TV or computer, put down your phone, and look at your food.  Smell it. Chew slowly, and don't put another bite on your fork until you swallow. When women ate lunch this attentively, they consumed 30 percent less when snacking later than those who listened to an audiobook at lunchtime, according to a study in the [place] of Nutrition. 8. Weighing Yourself Really Works The scale provides the best evidence about whether your efforts are paying off. Seeing the numbers tick up or down or stagnate is motivation to keep going-or to rethink your approach. A 2015 study at [facility] found that daily weigh-ins helped people lose more weight, keep it off, and maintain that loss, even after two years. Use it to lose it. Step on the scale at the same time every day for the best results. If your weight shoots up several pounds from one weigh-in to the next, don't freak out. Eating a lot of salt the night before or having your period is the likely culprit. The number should return to normal in a day or two. It's a steady climb that you need to do something about. 9. Too Much Stress and Too Little Sleep Are Your Enemies When you're tired and frazzled, your body cranks up the production of cortisol, the stress hormone that can cause carb cravings. Not getting enough sleep also boosts your levels of ghrelin, a hormone associated with hunger, while suppressing leptin, a hormone that signals fullness and satiety. People on a diet who slept only five and a half hours a night for two weeks lost 55 percent less fat and were hungrier than those who slept eight and a half hours, according to a study in the [place]. Use it to lose it. Prioritize sleep, aiming for seven hours or  more a night, which research shows helps lower stress. And make sure you're getting quality zzz's. If a snoring spouse or a fidgety cat wakes you up frequently throughout the night, you may end up getting the equivalent of just four hours of sleep, according to a study from [facility]. Keep pets out of the bedroom, and use a white-noise app to drown out snoring. 10. You Will Hit a plateau-And You Can Bust Through It As you slim down, your body releases much less leptin, the fullness hormone.  If you're not strength training, start right now. Building muscle can raise your metabolism to help you overcome a plateau. To keep your body challenged and burning calories, incorporate new moves and more intense intervals into your workouts or add another sweat session to your weekly routine. Alternatively, cut an extra 100 calories or so a day from your diet. Now that you've lost weight, your body simply doesn't need as much fuel.

## 2014-06-15 LAB — CBC WITH DIFFERENTIAL/PLATELET
BASOS ABS: 0 10*3/uL (ref 0.0–0.1)
BASOS PCT: 0 % (ref 0–1)
Eosinophils Absolute: 0.1 10*3/uL (ref 0.0–0.7)
Eosinophils Relative: 2 % (ref 0–5)
HCT: 38.6 % (ref 36.0–46.0)
Hemoglobin: 12.6 g/dL (ref 12.0–15.0)
Lymphocytes Relative: 35 % (ref 12–46)
Lymphs Abs: 2.3 10*3/uL (ref 0.7–4.0)
MCH: 28.1 pg (ref 26.0–34.0)
MCHC: 32.6 g/dL (ref 30.0–36.0)
MCV: 86.2 fL (ref 78.0–100.0)
MPV: 11.5 fL (ref 8.6–12.4)
Monocytes Absolute: 0.6 10*3/uL (ref 0.1–1.0)
Monocytes Relative: 9 % (ref 3–12)
Neutro Abs: 3.5 10*3/uL (ref 1.7–7.7)
Neutrophils Relative %: 54 % (ref 43–77)
PLATELETS: 150 10*3/uL (ref 150–400)
RBC: 4.48 MIL/uL (ref 3.87–5.11)
RDW: 14.6 % (ref 11.5–15.5)
WBC: 6.5 10*3/uL (ref 4.0–10.5)

## 2014-06-15 LAB — LIPID PANEL
CHOL/HDL RATIO: 1.8 ratio
CHOLESTEROL: 168 mg/dL (ref 0–200)
HDL: 91 mg/dL (ref >=46)
LDL Cholesterol: 66 mg/dL (ref 0–99)
TRIGLYCERIDES: 55 mg/dL (ref <150)
VLDL: 11 mg/dL (ref 0–40)

## 2014-06-15 LAB — HEPATIC FUNCTION PANEL
ALK PHOS: 50 U/L (ref 39–117)
ALT: <8 U/L (ref 0–35)
AST: 27 U/L (ref 0–37)
Albumin: 3.8 g/dL (ref 3.5–5.2)
Bilirubin, Direct: 0.1 mg/dL (ref 0.0–0.3)
Indirect Bilirubin: 0.2 mg/dL (ref 0.2–1.2)
Total Bilirubin: 0.3 mg/dL (ref 0.2–1.2)
Total Protein: 6.7 g/dL (ref 6.0–8.3)

## 2014-06-15 LAB — TSH: TSH: 0.397 u[IU]/mL (ref 0.350–4.500)

## 2014-06-15 LAB — BASIC METABOLIC PANEL WITH GFR
BUN: 19 mg/dL (ref 6–23)
CALCIUM: 9.2 mg/dL (ref 8.4–10.5)
CO2: 25 mEq/L (ref 19–32)
CREATININE: 0.82 mg/dL (ref 0.50–1.10)
Chloride: 103 mEq/L (ref 96–112)
GFR, EST AFRICAN AMERICAN: 79 mL/min
GFR, Est Non African American: 69 mL/min
Glucose, Bld: 89 mg/dL (ref 70–99)
POTASSIUM: 4.4 meq/L (ref 3.5–5.3)
Sodium: 137 mEq/L (ref 135–145)

## 2014-06-15 LAB — HEMOGLOBIN A1C
HEMOGLOBIN A1C: 6.1 % — AB (ref <5.7)
MEAN PLASMA GLUCOSE: 128 mg/dL — AB (ref <117)

## 2014-06-15 LAB — MAGNESIUM: MAGNESIUM: 1.6 mg/dL (ref 1.5–2.5)

## 2014-06-15 LAB — INSULIN, FASTING: Insulin fasting, serum: 3.6 u[IU]/mL (ref 2.0–19.6)

## 2014-06-15 LAB — VITAMIN D 25 HYDROXY (VIT D DEFICIENCY, FRACTURES): VIT D 25 HYDROXY: 73 ng/mL (ref 30–100)

## 2014-07-01 ENCOUNTER — Telehealth: Payer: Self-pay

## 2014-07-01 NOTE — Telephone Encounter (Signed)
Patient called requesting orders for diagnostic mammogram scheduled for 07-07-14 at [facility]. Please fax orders to [facility].

## 2014-07-22 ENCOUNTER — Encounter: Payer: Self-pay | Admitting: Internal Medicine

## 2014-09-21 ENCOUNTER — Other Ambulatory Visit: Payer: Self-pay | Admitting: Internal Medicine

## 2014-10-07 ENCOUNTER — Encounter: Payer: Self-pay | Admitting: Internal Medicine

## 2014-10-07 ENCOUNTER — Ambulatory Visit (INDEPENDENT_AMBULATORY_CARE_PROVIDER_SITE_OTHER): Payer: Medicare Other | Admitting: Internal Medicine

## 2014-10-07 VITALS — BP 118/66 | HR 64 | Temp 97.1°F | Resp 16 | Ht 60.25 in | Wt 143.2 lb

## 2014-10-07 DIAGNOSIS — I10 Essential (primary) hypertension: Secondary | ICD-10-CM

## 2014-10-07 DIAGNOSIS — Z1212 Encounter for screening for malignant neoplasm of rectum: Secondary | ICD-10-CM

## 2014-10-07 DIAGNOSIS — Z6827 Body mass index (BMI) 27.0-27.9, adult: Secondary | ICD-10-CM

## 2014-10-07 DIAGNOSIS — E039 Hypothyroidism, unspecified: Secondary | ICD-10-CM | POA: Diagnosis not present

## 2014-10-07 DIAGNOSIS — E559 Vitamin D deficiency, unspecified: Secondary | ICD-10-CM | POA: Diagnosis not present

## 2014-10-07 DIAGNOSIS — R7309 Other abnormal glucose: Secondary | ICD-10-CM | POA: Diagnosis not present

## 2014-10-07 DIAGNOSIS — R0989 Other specified symptoms and signs involving the circulatory and respiratory systems: Secondary | ICD-10-CM

## 2014-10-07 DIAGNOSIS — Z789 Other specified health status: Secondary | ICD-10-CM

## 2014-10-07 DIAGNOSIS — Z1389 Encounter for screening for other disorder: Secondary | ICD-10-CM | POA: Diagnosis not present

## 2014-10-07 DIAGNOSIS — Z0000 Encounter for general adult medical examination without abnormal findings: Secondary | ICD-10-CM | POA: Insufficient documentation

## 2014-10-07 DIAGNOSIS — E782 Mixed hyperlipidemia: Secondary | ICD-10-CM | POA: Diagnosis not present

## 2014-10-07 DIAGNOSIS — Z1331 Encounter for screening for depression: Secondary | ICD-10-CM

## 2014-10-07 DIAGNOSIS — Z79899 Other long term (current) drug therapy: Secondary | ICD-10-CM

## 2014-10-07 DIAGNOSIS — R7303 Prediabetes: Secondary | ICD-10-CM

## 2014-10-07 DIAGNOSIS — Z9181 History of falling: Secondary | ICD-10-CM

## 2014-10-07 NOTE — Patient Instructions (Signed)
Recommend Adult Low dose Aspirin or   coated  Aspirin 81 mg daily   To reduce risk of Colon Cancer 20 %,   Skin Cancer 26 % ,   Melanoma 46%   and   Pancreatic cancer 60%  ++++++++++++++++++  Vitamin D goal   is between 70-100.   Please make sure that you are taking your Vitamin D as directed.   It is very important as a natural anti-inflammatory   helping hair, skin, and nails, as well as reducing stroke and heart attack risk.   It helps your bones and helps with mood.  It also decreases numerous cancer risks so please take it as directed.   Low Vit D is associated with a 200-300% higher risk for CANCER   and 200-300% higher risk for HEART   ATTACK  &  STROKE.   .....................................Marland Kitchen  It is also associated with higher death rate at younger ages,   autoimmune diseases like Rheumatoid arthritis, Lupus, Multiple Sclerosis.     Also many other serious conditions, like depression, Alzheimer's  Dementia, infertility, muscle aches, fatigue, fibromyalgia - just to name a few.  +++++++++++++++++++  Recommend the book "The END of DIETING" by Dr Excell Seltzer   & the book "The END of DIABETES " by Dr Excell Seltzer  At [email or web address] - get book & Audio CD's     Being diabetic has a  300% increased risk for heart attack, stroke, cancer, and alzheimer- type vascular dementia. It is very important that you work harder with diet by avoiding all foods that are white. Avoid white rice (brown & wild rice is OK), white potatoes (sweetpotatoes in moderation is OK), White bread or wheat bread or anything made out of white flour like bagels, donuts, rolls, buns, biscuits, cakes, pastries, cookies, pizza crust, and pasta ( Preventive Care for Adults  A healthy lifestyle and preventive care can promote health and wellness. Preventive health guidelines for women include the following key practices.  A routine yearly physical is a good way to check with your health care  provider about your health and preventive screening. It is a chance to share any concerns and updates on your health and to receive a thorough exam.  Visit your dentist for a routine exam and preventive care every 6 months. Brush your teeth twice a day and floss once a day. Good oral hygiene prevents tooth decay and gum disease.  The frequency of eye exams is based on your age, health, family medical history, use of contact lenses, and other factors. Follow your health care provider's recommendations for frequency of eye exams.  Eat a healthy diet. Foods like vegetables, fruits, whole grains, low-fat dairy products, and lean protein foods contain the nutrients you need without too many calories. Decrease your intake of foods high in solid fats, added sugars, and salt. Eat the right amount of calories for you.Get information about a proper diet from your health care provider, if necessary.  Regular physical exercise is one of the most important things you can do for your health. Most adults should get at least 150 minutes of moderate-intensity exercise (any activity that increases your heart rate and causes you to sweat) each week. In addition, most adults need muscle-strengthening exercises on 2 or more days a week.  Maintain a healthy weight. The body mass index (BMI) is a screening tool to identify possible weight problems. It provides an estimate of body fat based on height and weight. Your health care provider  can find your BMI and can help you achieve or maintain a healthy weight.For adults 20 years and older:  A BMI below 18.5 is considered underweight.  A BMI of 18.5 to 24.9 is normal.  A BMI of 25 to 29.9 is considered overweight.  A BMI of 30 and above is considered obese.  Maintain normal blood lipids and cholesterol levels by exercising and minimizing your intake of saturated fat. Eat a balanced diet with plenty of fruit and vegetables. If your lipid or cholesterol levels are high,  you are over 50, or you are at high risk for heart disease, you may need your cholesterol levels checked more frequently.Ongoing high lipid and cholesterol levels should be treated with medicines if diet and exercise are not working.  If you smoke, find out from your health care provider how to quit. If you do not use tobacco, do not start.  Lung cancer screening is recommended for adults aged 78-80 years who are at high risk for developing lung cancer because of a history of smoking. A yearly low-dose CT scan of the lungs is recommended for people who have at least a 30-pack-year history of smoking and are a current smoker or have quit within the past 15 years. A pack year of smoking is smoking an average of 1 pack of cigarettes a day for 1 year (for example: 1 pack a day for 30 years or 2 packs a day for 15 years). Yearly screening should continue until the smoker has stopped smoking for at least 15 years. Yearly screening should be stopped for people who develop a health problem that would prevent them from having lung cancer treatment.  Avoid use of street drugs. Do not share needles with anyone. Ask for help if you need support or instructions about stopping the use of drugs.  High blood pressure causes heart disease and increases the risk of stroke.  Ongoing high blood pressure should be treated with medicines if weight loss and exercise do not work.  If you are 17-56 years old, ask your health care provider if you should take aspirin to prevent strokes.  Diabetes screening involves taking a blood sample to check your fasting blood sugar level. This should be done once every 3 years, after age 10, if you are within normal weight and without risk factors for diabetes. Testing should be considered at a younger age or be carried out more frequently if you are overweight and have at least 1 risk factor for diabetes.  Breast cancer screening is essential preventive care for women. You should practice  "breast self-awareness." This means understanding the normal appearance and feel of your breasts and may include breast self-examination. Any changes detected, no matter how small, should be reported to a health care provider. Women in their 62s and 30s should have a clinical breast exam (CBE) by a health care provider as part of a regular health exam every 1 to 3 years. After age 68, women should have a CBE every year. Starting at age 16, women should consider having a mammogram (breast X-ray test) every year. Women who have a family history of breast cancer should talk to their health care provider about genetic screening. Women at a high risk of breast cancer should talk to their health care providers about having an MRI and a mammogram every year.  Breast cancer gene (BRCA)-related cancer risk assessment is recommended for women who have family members with BRCA-related cancers. BRCA-related cancers include breast, ovarian, tubal, and  peritoneal cancers. Having family members with these cancers may be associated with an increased risk for harmful changes (mutations) in the breast cancer genes BRCA1 and BRCA2. Results of the assessment will determine the need for genetic counseling and BRCA1 and BRCA2 testing.  Routine pelvic exams to screen for cancer are no longer recommended for nonpregnant women who are considered low risk for cancer of the pelvic organs (ovaries, uterus, and vagina) and who do not have symptoms. Ask your health care provider if a screening pelvic exam is right for you.  If you have had past treatment for cervical cancer or a condition that could lead to cancer, you need Pap tests and screening for cancer for at least 20 years after your treatment. If Pap tests have been discontinued, your risk factors (such as having a new sexual partner) need to be reassessed to determine if screening should be resumed. Some women have medical problems that increase the chance of getting cervical  cancer. In these cases, your health care provider may recommend more frequent screening and Pap tests.    Colorectal cancer can be detected and often prevented. Most routine colorectal cancer screening begins at the age of 11 years and continues through age 35 years. However, your health care provider may recommend screening at an earlier age if you have risk factors for colon cancer. On a yearly basis, your health care provider may provide home test kits to check for hidden blood in the stool. Use of a small camera at the end of a tube, to directly examine the colon (sigmoidoscopy or colonoscopy), can detect the earliest forms of colorectal cancer. Talk to your health care provider about this at age 33, when routine screening begins. Direct exam of the colon should be repeated every 5-10 years through age 19 years, unless early forms of pre-cancerous polyps or small growths are found.  Osteoporosis is a disease in which the bones lose minerals and strength with aging. This can result in serious bone fractures or breaks. The risk of osteoporosis can be identified using a bone density scan. Women ages 80 years and over and women at risk for fractures or osteoporosis should discuss screening with their health care providers. Ask your health care provider whether you should take a calcium supplement or vitamin D to reduce the rate of osteoporosis.  Menopause can be associated with physical symptoms and risks. Hormone replacement therapy is available to decrease symptoms and risks. You should talk to your health care provider about whether hormone replacement therapy is right for you.  Use sunscreen. Apply sunscreen liberally and repeatedly throughout the day. You should seek shade when your shadow is shorter than you. Protect yourself by wearing long sleeves, pants, a wide-brimmed hat, and sunglasses year round, whenever you are outdoors.  Once a month, do a whole body skin exam, using a mirror to look at  the skin on your back. Tell your health care provider of new moles, moles that have irregular borders, moles that are larger than a pencil eraser, or moles that have changed in shape or color.  Stay current with required vaccines (immunizations).  Influenza vaccine. All adults should be immunized every year.  Tetanus, diphtheria, and acellular pertussis (Td, Tdap) vaccine. Pregnant women should receive 1 dose of Tdap vaccine during each pregnancy. The dose should be obtained regardless of the length of time since the last dose. Immunization is preferred during the 27th-36th week of gestation. An adult who has not previously received Tdap or  who does not know her vaccine status should receive 1 dose of Tdap. This initial dose should be followed by tetanus and diphtheria toxoids (Td) booster doses every 10 years. Adults with an unknown or incomplete history of completing a 3-dose immunization series with Td-containing vaccines should begin or complete a primary immunization series including a Tdap dose. Adults should receive a Td booster every 10 years.    Zoster vaccine. One dose is recommended for adults aged 3 years or older unless certain conditions are present.    Pneumococcal 13-valent conjugate (PCV13) vaccine. When indicated, a person who is uncertain of her immunization history and has no record of immunization should receive the PCV13 vaccine. An adult aged 60 years or older who has certain medical conditions and has not been previously immunized should receive 1 dose of PCV13 vaccine. This PCV13 should be followed with a dose of pneumococcal polysaccharide (PPSV23) vaccine. The PPSV23 vaccine dose should be obtained at least 8 weeks after the dose of PCV13 vaccine. An adult aged 69 years or older who has certain medical conditions and previously received 1 or more doses of PPSV23 vaccine should receive 1 dose of PCV13. The PCV13 vaccine dose should be obtained 1 or more years after the last  PPSV23 vaccine dose.    Pneumococcal polysaccharide (PPSV23) vaccine. When PCV13 is also indicated, PCV13 should be obtained first. All adults aged 41 years and older should be immunized. An adult younger than age 22 years who has certain medical conditions should be immunized. Any person who resides in a nursing home or long-term care facility should be immunized. An adult smoker should be immunized. People with an immunocompromised condition and certain other conditions should receive both PCV13 and PPSV23 vaccines. People with human immunodeficiency virus (HIV) infection should be immunized as soon as possible after diagnosis. Immunization during chemotherapy or radiation therapy should be avoided. Routine use of PPSV23 vaccine is not recommended for American Indians, [place]atives, or people younger than 65 years unless there are medical conditions that require PPSV23 vaccine. When indicated, people who have unknown immunization and have no record of immunization should receive PPSV23 vaccine. One-time revaccination 5 years after the first dose of PPSV23 is recommended for people aged 19-64 years who have chronic kidney failure, nephrotic syndrome, asplenia, or immunocompromised conditions. People who received 1-2 doses of PPSV23 before age 68 years should receive another dose of PPSV23 vaccine at age 94 years or later if at least 5 years have passed since the previous dose. Doses of PPSV23 are not needed for people immunized with PPSV23 at or after age 70 years.   Preventive Services / Frequency  Ages 66 years and over  Blood pressure check.  Lipid and cholesterol check.  Lung cancer screening. / Every year if you are aged 34-80 years and have a 30-pack-year history of smoking and currently smoke or have quit within the past 15 years. Yearly screening is stopped once you have quit smoking for at least 15 years or develop a health problem that would prevent you from having lung cancer  treatment.  Clinical breast exam.** / Every year after age 88 years.  BRCA-related cancer risk assessment.** / For women who have family members with a BRCA-related cancer (breast, ovarian, tubal, or peritoneal cancers).  Mammogram.** / Every year beginning at age 58 years and continuing for as long as you are in good health. Consult with your health care provider.  Pap test.** / Every 3 years starting at age 60  years through age 54 or 9 years with 3 consecutive normal Pap tests. Testing can be stopped between 65 and 70 years with 3 consecutive normal Pap tests and no abnormal Pap or HPV tests in the past 10 years.  Fecal occult blood test (FOBT) of stool. / Every year beginning at age 39 years and continuing until age 50 years. You may not need to do this test if you get a colonoscopy every 10 years.  Flexible sigmoidoscopy or colonoscopy.** / Every 5 years for a flexible sigmoidoscopy or every 10 years for a colonoscopy beginning at age 56 years and continuing until age 38 years.  Hepatitis C blood test.** / For all people born from [birth year] through 1963 and any individual with known risks for hepatitis C.  Osteoporosis screening.** / A one-time screening for women ages 26 years and over and women at risk for fractures or osteoporosis.  Skin self-exam. / Monthly.  Influenza vaccine. / Every year.  Tetanus, diphtheria, and acellular pertussis (Tdap/Td) vaccine.** / 1 dose of Td every 10 years.  Zoster vaccine.** / 1 dose for adults aged 4 years or older.  Pneumococcal 13-valent conjugate (PCV13) vaccine.** / Consult your health care provider.  Pneumococcal polysaccharide (PPSV23) vaccine.** / 1 dose for all adults aged 28 years and older. Screening for abdominal aortic aneurysm (AAA)  by ultrasound is recommended for people who have history of high blood pressure or who are current or former smokers.from white flour & egg whites) - vegetarian pasta or spinach or wheat pasta is OK.  Multigrain breads like [employer] or [employer], or multigrain sandwich thins or flatbreads.  Diet, exercise and weight loss can reverse and cure diabetes in the early stages.  Diet, exercise and weight loss is very important in the control and prevention of complications of diabetes which affects every system in your body, ie. Brain - dementia/stroke, eyes - glaucoma/blindness, heart - heart attack/heart failure, kidneys - dialysis, stomach - gastric paralysis, intestines - malabsorption, nerves - severe painful neuritis, circulation - gangrene & loss of a leg(s), and finally cancer and Alzheimers.    I recommend avoid fried & greasy foods,  sweets/candy, white rice (brown or wild rice or Quinoa is OK), white potatoes (sweet potatoes are OK) - anything made from white flour - bagels, doughnuts, rolls, buns, biscuits,white and wheat breads, pizza crust and traditional pasta made of white flour & egg white(vegetarian pasta or spinach or wheat pasta is OK).  Multi-grain bread is OK - like multi-grain flat bread or sandwich thins. Avoid alcohol in excess. Exercise is also important.    Eat all the vegetables you want - avoid meat, especially red meat and dairy - especially cheese.  Cheese is the most concentrated form of trans-fats which is the worst thing to clog up our arteries. Veggie cheese is OK which can be found in the fresh produce section at [facility] or [employer] or [employer]  ++++++++++++++++++++++++++

## 2014-10-07 NOTE — Progress Notes (Signed)
Patient ID: Robin Huynh, female   DOB: 08-Mar-1934, 80 y.o.   MRN: [id number]   Comprehensive Examination  This very nice 80 y.o. MBF presents for complete physical.  Patient has been followed for HTN, Prediabetes, Hyperlipidemia, Hypothyroidism  and Vitamin D Deficiency.    HTN predates since 2000. Patient's BP has been controlled at home and patient denies any cardiac symptoms as chest pain, palpitations, shortness of breath, dizziness or ankle swelling. Today's BP: 118/66 mmHg. Patient exercises at the "Y" 3 x week and walks the other 4 days.    Patient's hyperlipidemia is controlled with diet and medications. Patient denies myalgias or other medication SE's. Last lipids were at goal with  Cholesterol 168; HDL 91; LDL 66; Triglycerides 55 on 06/14/2014   Patient has prediabetes predating since 2011 with A1c 6.1% and in 2012 his A1c was 6.4% and patient denies reactive hypoglycemic symptoms, visual blurring, diabetic polys, or paresthesias. Last A1c was still elevated at  6.1% on 06/14/2014.    Patient was dx'd with Goiter & Hypothyroidism in 2011 and has been on replacement therapy since. Finally, patient has history of Vitamin D Deficiency and last Vitamin D was 73 on 06/14/2014.      Medication Sig  . aspirin 81 MG tablet Take 81 mg by mouth daily.  Marland Kitchen CALCIUM PO Take 1 tablet by mouth daily.  Marland Kitchen VITAMIN D 2000 UNITS  Take 2,000 Units by mouth 3 (three) times daily.  . fish oil 1000 MG capsule Take 2 g by mouth daily.  Marland Kitchen levothyroxine  100 MCG tablet Take 50-100 mcg by mouth daily. Take 100 mcg on Mon wed and fri and 1/2 tablet the rest of the week  . magnesium 30 MG tablet Take 30 mg by mouth 2 (two) times daily.  . Multiple Vitamin  Take 1 tablet by mouth daily.  . Red Yeast Rice 600 MG  Take 600 mg by mouth daily.   . tamoxifen (NOLVADEX) 20 MG tablet Take 1 tablet (20 mg total) by mouth daily.  . vitamin C 500 MG tablet Take 500 mg by mouth daily.   No Known Allergies   Past Medical  History  Diagnosis Date  . Breast cancer 08/2011    right  . Labile hypertension   . Hypothyroidism   . Prediabetes   . Vitamin D deficiency   . Diverticulosis   . Thoracic aorta atherosclerosis     via CXR   Health Maintenance  Topic Date Due  . ZOSTAVAX  04/29/1996  . DEXA SCAN  04/29/2001  . INFLUENZA VACCINE  08/09/2014  . TETANUS/TDAP  05/19/2018  . PNA vac Low Risk Adult  Completed   Immunization History  Administered Date(s) Administered  . Pneumococcal Conjugate-13 03/01/2014  . Pneumococcal Polysaccharide-23 01/09/2007  . Td 05/18/2008   Past Surgical History  Procedure Laterality Date  . Breast surgery      right breast  . Colonoscopy    . Mastectomy complete / simple w/ sentinel node biopsy  08/10/11    right  . Mastectomy w/ sentinel node biopsy  08/10/2011    Procedure: MASTECTOMY WITH SENTINEL LYMPH NODE BIOPSY;  Surgeon: Harl Bowie, MD;  Location: [facility];  Service: General;  Laterality: Right;   Family History  Problem Relation Age of Onset  . Colon cancer Mother 77  . Prostate cancer Father   . Lung cancer Brother   . Lung cancer Sister   . Kidney cancer Brother    Social History  Substance Use Topics  . Smoking status: Never Smoker   . Smokeless tobacco: Never Used  . Alcohol Use: No    ROS Constitutional: Denies fever, chills, weight loss/gain, headaches, insomnia,  night sweats, and change in appetite. Does c/o fatigue. Eyes: Denies redness, blurred vision, diplopia, discharge, itchy, watery eyes.  ENT: Denies discharge, congestion, post nasal drip, epistaxis, sore throat, earache, hearing loss, dental pain, Tinnitus, Vertigo, Sinus pain, snoring.  Cardio: Denies chest pain, palpitations, irregular heartbeat, syncope, dyspnea, diaphoresis, orthopnea, PND, claudication, edema Respiratory: denies cough, dyspnea, DOE, pleurisy, hoarseness, laryngitis, wheezing.  Gastrointestinal: Denies dysphagia, heartburn, reflux, water brash, pain, cramps,  nausea, vomiting, bloating, diarrhea, constipation, hematemesis, melena, hematochezia, jaundice, hemorrhoids Genitourinary: Denies dysuria, frequency, urgency, nocturia, hesitancy, discharge, hematuria, flank pain Breast: Breast lumps, nipple discharge, bleeding.  Musculoskeletal: Denies arthralgia, myalgia, stiffness, Jt. Swelling, pain, limp, and strain/sprain. Denies falls. Skin: Denies puritis, rash, hives, warts, acne, eczema, changing in skin lesion Neuro: No weakness, tremor, incoordination, spasms, paresthesia, pain Psychiatric: Denies confusion, memory loss, sensory loss. Denies Depression. Endocrine: Denies change in weight, skin, hair change, nocturia, and paresthesia, diabetic polys, visual blurring, hyper / hypo glycemic episodes.  Heme/Lymph: No excessive bleeding, bruising, enlarged lymph nodes.  Physical Exam  BP 118/66 mmHg  Pulse 64  Temp(Src) 97.1 F (36.2 C)  Resp 16  Ht 5' 0.25" (1.53 m)  Wt 143 lb 3.2 oz (64.955 kg)  BMI 27.75 kg/m2  General Appearance: Well nourished and in no apparent distress. Eyes: PERRLA, EOMs, conjunctiva no swelling or erythema, normal fundi and vessels. Sinuses: No frontal/maxillary tenderness ENT/Mouth: EACs patent / TMs  nl. Nares clear without erythema, swelling, mucoid exudates. Oral hygiene is good. No erythema, swelling, or exudate. Tongue normal, non-obstructing. Tonsils not swollen or erythematous. Hearing normal.  Neck: Supple, thyroid normal. No bruits, nodes or JVD. Respiratory: Respiratory effort normal.  BS equal and clear bilateral without rales, rhonci, wheezing or stridor. Cardio: Heart sounds are normal with regular rate and rhythm and no murmurs, rubs or gallops. Peripheral pulses are normal and equal bilaterally without edema. No aortic or femoral bruits. Chest: symmetric with normal excursions and percussion. Breasts: Symmetric, without lumps, nipple discharge, retractions, or fibrocystic changes.  Abdomen: Flat, soft,  with bowel sounds. Nontender, no guarding, rebound, hernias, masses, or organomegaly.  Lymphatics: Non tender without lymphadenopathy.   Musculoskeletal: Full ROM all peripheral extremities, joint stability, 5/5 strength, and normal gait. Skin: Warm and dry without rashes, lesions, cyanosis, clubbing or  ecchymosis.  Neuro: Cranial nerves intact, reflexes equal bilaterally. Normal muscle tone, no cerebellar symptoms. Sensation intact.  Pysch: Alert and oriented X 3, normal affect, Insight and Judgment appropriate.   Assessment and Plan  1. Labile hypertension (2000)  - Microalbumin / creatinine urine ratio - EKG 12-Lead - [place], RETROPERITNL ABD,  LTD - TSH  2. Hyperlipidemia  - Lipid panel  3. Prediabetes (06/2009)  - Hemoglobin A1c - Insulin, random  4. Vitamin D deficiency  - Vit D  25 hydroxy   5. Hypothyroidism  - TSH  6. Screening for rectal cancer  - POC Hemoccult Bld/Stl  7. Depression screen   8. Medication management  - Urinalysis, Routine w reflex microscopic  - CBC with Differential/Platelet - BASIC METABOLIC PANEL WITH GFR - Hepatic function panel - Magnesium  9. BMI 27.0-27.9,adult   10. At low risk for fall   Continue prudent diet as discussed, weight control, BP monitoring, regular exercise, and medications. Discussed med's effects and SE's. Screening labs and tests  as requested with regular follow-up as recommended.  Over 40 minutes of exam, counseling, chart review was performed.

## 2014-10-08 LAB — LIPID PANEL
Cholesterol: 187 mg/dL (ref 125–200)
HDL: 83 mg/dL (ref >=46)
LDL Cholesterol: 91 mg/dL (ref <130)
Total CHOL/HDL Ratio: 2.3 Ratio (ref <=5.0)
Triglycerides: 66 mg/dL (ref <150)
VLDL: 13 mg/dL (ref <30)

## 2014-10-08 LAB — BASIC METABOLIC PANEL WITH GFR
BUN: 26 mg/dL — AB (ref 7–25)
CALCIUM: 9.5 mg/dL (ref 8.6–10.4)
CO2: 28 mmol/L (ref 20–31)
Chloride: 102 mmol/L (ref 98–110)
Creat: 0.95 mg/dL — ABNORMAL HIGH (ref 0.60–0.93)
GFR, EST AFRICAN AMERICAN: 66 mL/min (ref >=60)
GFR, Est Non African American: 58 mL/min — ABNORMAL LOW (ref >=60)
GLUCOSE: 95 mg/dL (ref 65–99)
Potassium: 5 mmol/L (ref 3.5–5.3)
Sodium: 135 mmol/L (ref 135–146)

## 2014-10-08 LAB — CBC WITH DIFFERENTIAL/PLATELET
Basophils Absolute: 0 10*3/uL (ref 0.0–0.1)
Basophils Relative: 0 % (ref 0–1)
EOS PCT: 2 % (ref 0–5)
Eosinophils Absolute: 0.1 10*3/uL (ref 0.0–0.7)
HEMATOCRIT: 39.4 % (ref 36.0–46.0)
Hemoglobin: 12.8 g/dL (ref 12.0–15.0)
LYMPHS ABS: 2.5 10*3/uL (ref 0.7–4.0)
LYMPHS PCT: 36 % (ref 12–46)
MCH: 27.9 pg (ref 26.0–34.0)
MCHC: 32.5 g/dL (ref 30.0–36.0)
MCV: 85.8 fL (ref 78.0–100.0)
MONO ABS: 0.6 10*3/uL (ref 0.1–1.0)
MONOS PCT: 9 % (ref 3–12)
MPV: 11 fL (ref 8.6–12.4)
Neutro Abs: 3.7 10*3/uL (ref 1.7–7.7)
Neutrophils Relative %: 53 % (ref 43–77)
Platelets: 148 10*3/uL — ABNORMAL LOW (ref 150–400)
RBC: 4.59 MIL/uL (ref 3.87–5.11)
RDW: 14.4 % (ref 11.5–15.5)
WBC: 7 10*3/uL (ref 4.0–10.5)

## 2014-10-08 LAB — URINALYSIS, ROUTINE W REFLEX MICROSCOPIC
BILIRUBIN URINE: NEGATIVE
GLUCOSE, UA: NEGATIVE
Hgb urine dipstick: NEGATIVE
Leukocytes, UA: NEGATIVE
Nitrite: NEGATIVE
Protein, ur: NEGATIVE
SPECIFIC GRAVITY, URINE: 1.009 (ref 1.001–1.035)
pH: 5 (ref 5.0–8.0)

## 2014-10-08 LAB — HEPATIC FUNCTION PANEL
ALBUMIN: 3.5 g/dL — AB (ref 3.6–5.1)
ALT: 8 U/L (ref 6–29)
AST: 27 U/L (ref 10–35)
Alkaline Phosphatase: 54 U/L (ref 33–130)
Bilirubin, Direct: 0.1 mg/dL (ref <=0.2)
Indirect Bilirubin: 0.3 mg/dL (ref 0.2–1.2)
TOTAL PROTEIN: 6.9 g/dL (ref 6.1–8.1)
Total Bilirubin: 0.4 mg/dL (ref 0.2–1.2)

## 2014-10-08 LAB — HEMOGLOBIN A1C
Hgb A1c MFr Bld: 6.1 % — ABNORMAL HIGH (ref <5.7)
Mean Plasma Glucose: 128 mg/dL — ABNORMAL HIGH (ref <117)

## 2014-10-08 LAB — VITAMIN D 25 HYDROXY (VIT D DEFICIENCY, FRACTURES): VIT D 25 HYDROXY: 84 ng/mL (ref 30–100)

## 2014-10-08 LAB — MICROALBUMIN / CREATININE URINE RATIO: Creatinine, Urine: 49.2 mg/dL

## 2014-10-08 LAB — MAGNESIUM: Magnesium: 1.8 mg/dL (ref 1.5–2.5)

## 2014-10-08 LAB — TSH: TSH: 0.583 u[IU]/mL (ref 0.350–4.500)

## 2014-10-08 LAB — INSULIN, RANDOM: INSULIN: 5.9 u[IU]/mL (ref 2.0–19.6)

## 2014-10-16 ENCOUNTER — Encounter: Payer: Self-pay | Admitting: *Deleted

## 2014-10-22 ENCOUNTER — Other Ambulatory Visit: Payer: Self-pay | Admitting: *Deleted

## 2014-10-22 DIAGNOSIS — Z1212 Encounter for screening for malignant neoplasm of rectum: Secondary | ICD-10-CM

## 2014-10-22 LAB — POC HEMOCCULT BLD/STL (HOME/3-CARD/SCREEN)
Card #3 Fecal Occult Blood, POC: NEGATIVE
FECAL OCCULT BLD: NEGATIVE
Fecal Occult Blood, POC: NEGATIVE

## 2015-01-03 ENCOUNTER — Encounter: Payer: Self-pay | Admitting: *Deleted

## 2015-01-14 ENCOUNTER — Ambulatory Visit: Payer: Self-pay | Admitting: Internal Medicine

## 2015-01-20 ENCOUNTER — Other Ambulatory Visit: Payer: Self-pay | Admitting: Internal Medicine

## 2015-01-24 ENCOUNTER — Telehealth: Payer: Self-pay | Admitting: Hematology and Oncology

## 2015-01-24 NOTE — Telephone Encounter (Signed)
Left message for patient regarding appointment change from 2/9 to 2/14. Appointment sent via mail.

## 2015-01-27 ENCOUNTER — Ambulatory Visit (INDEPENDENT_AMBULATORY_CARE_PROVIDER_SITE_OTHER): Payer: Medicare Other | Admitting: Internal Medicine

## 2015-01-27 ENCOUNTER — Encounter: Payer: Self-pay | Admitting: Internal Medicine

## 2015-01-27 VITALS — BP 134/62 | HR 60 | Temp 97.8°F | Resp 16 | Ht 60.25 in | Wt 144.0 lb

## 2015-01-27 DIAGNOSIS — R6889 Other general symptoms and signs: Secondary | ICD-10-CM

## 2015-01-27 DIAGNOSIS — I10 Essential (primary) hypertension: Secondary | ICD-10-CM

## 2015-01-27 DIAGNOSIS — E782 Mixed hyperlipidemia: Secondary | ICD-10-CM | POA: Diagnosis not present

## 2015-01-27 DIAGNOSIS — R0989 Other specified symptoms and signs involving the circulatory and respiratory systems: Secondary | ICD-10-CM

## 2015-01-27 DIAGNOSIS — Z0001 Encounter for general adult medical examination with abnormal findings: Secondary | ICD-10-CM | POA: Diagnosis not present

## 2015-01-27 DIAGNOSIS — E049 Nontoxic goiter, unspecified: Secondary | ICD-10-CM

## 2015-01-27 DIAGNOSIS — R7303 Prediabetes: Secondary | ICD-10-CM

## 2015-01-27 DIAGNOSIS — K5790 Diverticulosis of intestine, part unspecified, without perforation or abscess without bleeding: Secondary | ICD-10-CM

## 2015-01-27 DIAGNOSIS — E039 Hypothyroidism, unspecified: Secondary | ICD-10-CM | POA: Diagnosis not present

## 2015-01-27 DIAGNOSIS — E010 Iodine-deficiency related diffuse (endemic) goiter: Secondary | ICD-10-CM

## 2015-01-27 DIAGNOSIS — C50511 Malignant neoplasm of lower-outer quadrant of right female breast: Secondary | ICD-10-CM | POA: Diagnosis not present

## 2015-01-27 DIAGNOSIS — E559 Vitamin D deficiency, unspecified: Secondary | ICD-10-CM

## 2015-01-27 DIAGNOSIS — Z79899 Other long term (current) drug therapy: Secondary | ICD-10-CM

## 2015-01-27 DIAGNOSIS — Z0000 Encounter for general adult medical examination without abnormal findings: Secondary | ICD-10-CM

## 2015-01-27 LAB — BASIC METABOLIC PANEL WITH GFR
BUN: 22 mg/dL (ref 7–25)
CO2: 27 mmol/L (ref 20–31)
Calcium: 9.4 mg/dL (ref 8.6–10.4)
Chloride: 105 mmol/L (ref 98–110)
Creat: 0.78 mg/dL (ref 0.60–0.93)
GFR, EST AFRICAN AMERICAN: 84 mL/min (ref >=60)
GFR, Est Non African American: 73 mL/min (ref >=60)
GLUCOSE: 90 mg/dL (ref 65–99)
POTASSIUM: 4.2 mmol/L (ref 3.5–5.3)
Sodium: 140 mmol/L (ref 135–146)

## 2015-01-27 LAB — LIPID PANEL
CHOLESTEROL: 164 mg/dL (ref 125–200)
HDL: 76 mg/dL (ref >=46)
LDL Cholesterol: 77 mg/dL (ref <130)
TRIGLYCERIDES: 55 mg/dL (ref <150)
Total CHOL/HDL Ratio: 2.2 Ratio (ref <=5.0)
VLDL: 11 mg/dL (ref <30)

## 2015-01-27 LAB — HEPATIC FUNCTION PANEL
ALBUMIN: 3.5 g/dL — AB (ref 3.6–5.1)
ALT: 6 U/L (ref 6–29)
AST: 23 U/L (ref 10–35)
Alkaline Phosphatase: 44 U/L (ref 33–130)
Bilirubin, Direct: 0.1 mg/dL (ref <=0.2)
Indirect Bilirubin: 0.2 mg/dL (ref 0.2–1.2)
Total Bilirubin: 0.3 mg/dL (ref 0.2–1.2)
Total Protein: 6.6 g/dL (ref 6.1–8.1)

## 2015-01-27 LAB — CBC WITH DIFFERENTIAL/PLATELET
BASOS ABS: 0 10*3/uL (ref 0.0–0.1)
Basophils Relative: 0 % (ref 0–1)
EOS PCT: 1 % (ref 0–5)
Eosinophils Absolute: 0.1 10*3/uL (ref 0.0–0.7)
HEMATOCRIT: 37 % (ref 36.0–46.0)
HEMOGLOBIN: 12.1 g/dL (ref 12.0–15.0)
LYMPHS ABS: 2.2 10*3/uL (ref 0.7–4.0)
LYMPHS PCT: 26 % (ref 12–46)
MCH: 28.1 pg (ref 26.0–34.0)
MCHC: 32.7 g/dL (ref 30.0–36.0)
MCV: 86 fL (ref 78.0–100.0)
MONO ABS: 0.8 10*3/uL (ref 0.1–1.0)
MPV: 10.7 fL (ref 8.6–12.4)
Monocytes Relative: 10 % (ref 3–12)
NEUTROS ABS: 5.3 10*3/uL (ref 1.7–7.7)
Neutrophils Relative %: 63 % (ref 43–77)
Platelets: 154 10*3/uL (ref 150–400)
RBC: 4.3 MIL/uL (ref 3.87–5.11)
RDW: 13.9 % (ref 11.5–15.5)
WBC: 8.4 10*3/uL (ref 4.0–10.5)

## 2015-01-27 NOTE — Progress Notes (Signed)
Patient ID: Robin Huynh, female   DOB: August 16, 1934, 81 y.o.   MRN: [id number]  MEDICARE ANNUAL WELLNESS VISIT AND FOLLOW UP  Assessment:    1. Labile hypertension (2000) -DASH diet -cont exercise - TSH  2. Hypothyroidism, unspecified hypothyroidism type -thyroid large on exam -recommend ultrasound - TSH  3. Prediabetes (06/2009) -cont diet and exercise - Hemoglobin A1c  4. Vitamin D deficiency -cont supplement  5. Medication management  - CBC with Differential/Platelet - BASIC METABOLIC PANEL WITH GFR - Hepatic function panel  6. Hyperlipidemia  - Lipid panel  7. Diverticulosis of intestine without bleeding, unspecified intestinal tract location -not currently problematic  8. Primary cancer of lower outer quadrant of right female breast ([place]) -on tamoxifen -followed by oncology  9. Medicare annual wellness visit, subsequent -due next year  55. Thyromegaly -Likely needs repeat ultrasound    Over 30 minutes of exam, counseling, chart review, and critical decision making was performed  Plan:   During the course of the visit the patient was educated and counseled about appropriate screening and preventive services including:    Pneumococcal vaccine   Influenza vaccine  Td vaccine  Prevnar 13  Screening electrocardiogram  Screening mammography  Bone densitometry screening  Colorectal cancer screening  Diabetes screening  Glaucoma screening  Nutrition counseling   Advanced directives: given info/requested copies  Conditions/risks identified: Diabetes is at goal, ACE/ARB therapy: No, Reason not on Ace Inhibitor/ARB therapy:  not indicated currently for BP or for kidney protection  Urinary Incontinence is not an issue: discussed non pharmacology and pharmacology options.  Fall risk: low- discussed PT, home fall assessment, medications.    Subjective:   Robin Huynh is a 81 y.o. female who presents for Medicare Annual Wellness Visit and  3 month follow up on hypertension, prediabetes, hyperlipidemia, vitamin D def.  Date of last medicare wellness visit is unknown.   Her blood pressure has been controlled at home, today their BP is BP: 134/62 mmHg She does workout. She denies chest pain, shortness of breath, dizziness.  She is on cholesterol medication and denies myalgias. Her cholesterol is at goal. The cholesterol last visit was:   Lab Results  Component Value Date   CHOL 187 10/07/2014   HDL 83 10/07/2014   LDLCALC 91 10/07/2014   TRIG 66 10/07/2014   CHOLHDL 2.3 10/07/2014   She has been working on diet and exercise for prediabetes, and denies foot ulcerations, hyperglycemia, hypoglycemia , increased appetite, nausea, paresthesia of the feet, polydipsia, polyuria, visual disturbances, vomiting and weight loss. Last A1C in the office was:  Lab Results  Component Value Date   HGBA1C 6.1* 10/07/2014   Last GFR NonAA   Lab Results  Component Value Date   GFRNONAA 58* 10/07/2014   AA  Lab Results  Component Value Date   GFRAA 66 10/07/2014   Patient is on Vitamin D supplement. Lab Results  Component Value Date   VD25OH 35 10/07/2014     Patient reports that she is taking her thyroid medication and will get back to [place] with dosing.    She is still taking tamoxifen.  She has history of breast cancer.    Medication Review Current Outpatient Prescriptions on File Prior to Visit  Medication Sig Dispense Refill  . aspirin 81 MG tablet Take 81 mg by mouth daily.    Marland Kitchen CALCIUM PO Take 1 tablet by mouth daily.    . Cholecalciferol (VITAMIN D) 2000 UNITS tablet Take 2,000 Units by mouth  3 (three) times daily.    . fish oil-omega-3 fatty acids 1000 MG capsule Take 2 g by mouth daily.    Marland Kitchen levothyroxine (SYNTHROID, LEVOTHROID) 100 MCG tablet Take 50-100 mcg by mouth daily. Take 100 mcg on Mon wed and fri and 1/2 tablet the rest of the week    . magnesium 30 MG tablet Take 30 mg by mouth 2 (two) times daily.    .  Multiple Vitamin (MULTIVITAMIN) tablet Take 1 tablet by mouth daily.    . Red Yeast Rice 600 MG CAPS Take 600 mg by mouth daily.     . tamoxifen (NOLVADEX) 20 MG tablet Take 1 tablet (20 mg total) by mouth daily. 90 tablet 10  . vitamin C (ASCORBIC ACID) 500 MG tablet Take 500 mg by mouth daily.     No current facility-administered medications on file prior to visit.    Current Problems (verified) Patient Active Problem List   Diagnosis Date Noted  . Medicare annual wellness visit, subsequent 10/07/2014  . Hyperlipidemia 07/15/2013  . Medication management 06/30/2013  . Labile hypertension (2000)   . Hypothyroidism   . Prediabetes (06/2009)   . Vitamin D deficiency   . Diverticulosis   . Primary cancer of lower outer quadrant of right female breast ([place]) 06/21/2011    Screening Tests Immunization History  Administered Date(s) Administered  . Pneumococcal Conjugate-13 03/01/2014  . Pneumococcal Polysaccharide-23 01/09/2007  . Td 05/18/2008    Preventative care: Last colonoscopy: 2014 Last mammogram: 07/07/14  DEXA:07/07/14  Prior vaccinations: TD or Tdap: 2010  Influenza: Declined  Pneumococcal: 2009 Prevnar13: 2010 Shingles/Zostavax: 2010  Names of Other Physician/Practitioners you currently use: 1. [facility]- here for primary care 2. [facility] [facility], eye doctor, last visit 2016 3. Dr.  Toy Cookey, dentist, last visit 2016  Patient Care Team: [name], MD as PCP - General (Internal Medicine) Nicholas Lose, MD as Consulting Physician (Hematology and Oncology) Sable Feil, MD as Consulting Physician (Gastroenterology) Coralie Keens, MD as Consulting Physician (General Surgery)  Past Surgical History  Procedure Laterality Date  . Breast surgery      right breast  . Colonoscopy    . Mastectomy complete / simple w/ sentinel node biopsy  08/10/11    right  . Mastectomy w/ sentinel node biopsy  08/10/2011     Procedure: MASTECTOMY WITH SENTINEL LYMPH NODE BIOPSY;  Surgeon: Harl Bowie, MD;  Location: [facility];  Service: General;  Laterality: Right;   Family History  Problem Relation Age of Onset  . Colon cancer Mother 71  . Prostate cancer Father   . Lung cancer Brother   . Lung cancer Sister   . Kidney cancer Brother    Social History  Substance Use Topics  . Smoking status: Never Smoker   . Smokeless tobacco: Never Used  . Alcohol Use: No    MEDICARE WELLNESS OBJECTIVES: Tobacco use: She does not smoke.  Patient is not a former smoker. If yes, counseling given Alcohol Current alcohol use: none Osteoporosis: postmenopausal estrogen deficiency, History of fracture in the past year: no Fall risk: Minimal risk Hearing: normal Visual acuity: normal,  does perform annual eye exam Diet: in general, a "healthy" diet   Physical activity: Current Exercise Habits:: Home exercise routine, Type of exercise: treadmill;walking, Time (Minutes): 45, Frequency (Times/Week): 6, Weekly Exercise (Minutes/Week): 270, Intensity: Moderate Cardiac risk factors: Cardiac Risk Factors include: dyslipidemia;hypertension;diabetes mellitus;advanced age (>51men, >7 women) Depression/mood screen:   Depression  screen PHQ 2/9 01/27/2015  Decreased Interest 0  Down, Depressed, Hopeless 0  PHQ - 2 Score 0    ADLs:  In your present state of health, do you have any difficulty performing the following activities: 01/27/2015 10/07/2014  Hearing? N N  Vision? N N  Difficulty concentrating or making decisions? N N  Walking or climbing stairs? N N  Dressing or bathing? N N  Doing errands, shopping? N N  Preparing Food and eating ? N -  Using the Toilet? N -  In the past six months, have you accidently leaked urine? N -  Do you have problems with loss of bowel control? N -  Managing your Medications? N -  Managing your Finances? N -  Housekeeping or managing your Housekeeping? N -     Cognitive Testing  Alert?  Yes  Normal Appearance?Yes  Oriented to person? Yes  Place? Yes   Time? Yes  Recall of three objects?  Yes  Can perform simple calculations? Yes  Displays appropriate judgment?Yes  Can read the correct time from a watch face?Yes  EOL planning: Does patient have an advance directive?: No, Yes Type of Advance Directive: Living will Does patient want to make changes to advanced directive?: No - Patient declined Copy of advanced directive(s) in chart?: No - copy requested   Objective:   Today's Vitals   01/27/15 1056  BP: 134/62  Pulse: 60  Temp: 97.8 F (36.6 C)  TempSrc: Temporal  Resp: 16  Height: 5' 0.25" (1.53 m)  Weight: 144 lb (65.318 kg)   Body mass index is 27.9 kg/(m^2).  General appearance: alert, no distress, WD/WN,  female HEENT: normocephalic, sclerae anicteric, TMs pearly, nares patent, no discharge or erythema, pharynx normal Oral cavity: MMM, no lesions Neck: supple, no lymphadenopathy, thyromegaly which is soft and non-tender to palpation, no masses Heart: RRR, normal S1, S2, no murmurs Lungs: CTA bilaterally, no wheezes, rhonchi, or rales Abdomen: +bs, soft, non tender, non distended, no masses, no hepatomegaly, no splenomegaly Musculoskeletal: nontender, no swelling, no obvious deformity Extremities: no edema, no cyanosis, no clubbing Pulses: 2+ symmetric, upper and lower extremities, normal cap refill Neurological: alert, oriented x 3, CN2-12 intact, strength normal upper extremities and lower extremities, sensation normal throughout, DTRs 2+ throughout, no cerebellar signs, gait normal Psychiatric: normal affect, behavior normal, pleasant  Breast: defer Gyn: defer Rectal: defer   Medicare Attestation I have personally reviewed: The patient's medical and social history Their use of alcohol, tobacco or illicit drugs Their current medications and supplements The patient's functional ability including ADLs,fall risks, home safety risks, cognitive, and  hearing and visual impairment Diet and physical activities Evidence for depression or mood disorders  The patient's weight, height, BMI, and visual acuity have been recorded in the chart.  I have made referrals, counseling, and provided education to the patient based on review of the above and I have provided the patient with a written personalized care plan for preventive services.     [name], PA-C   01/27/2015

## 2015-01-28 LAB — HEMOGLOBIN A1C
Hgb A1c MFr Bld: 6.3 % — ABNORMAL HIGH (ref <5.7)
MEAN PLASMA GLUCOSE: 134 mg/dL — AB (ref <117)

## 2015-01-28 LAB — TSH: TSH: 0.495 u[IU]/mL (ref 0.350–4.500)

## 2015-02-02 ENCOUNTER — Ambulatory Visit
Admission: RE | Admit: 2015-02-02 | Discharge: 2015-02-02 | Disposition: A | Discharge status: Home / Self Care | Payer: Medicare Other | Source: Ambulatory Visit | Attending: Internal Medicine | Admitting: Internal Medicine

## 2015-02-02 DIAGNOSIS — E010 Iodine-deficiency related diffuse (endemic) goiter: Secondary | ICD-10-CM

## 2015-02-02 DIAGNOSIS — E039 Hypothyroidism, unspecified: Secondary | ICD-10-CM

## 2015-02-03 ENCOUNTER — Other Ambulatory Visit: Payer: Self-pay | Admitting: Internal Medicine

## 2015-02-03 DIAGNOSIS — E041 Nontoxic single thyroid nodule: Secondary | ICD-10-CM

## 2015-02-04 ENCOUNTER — Other Ambulatory Visit: Payer: Self-pay | Admitting: Internal Medicine

## 2015-02-04 DIAGNOSIS — E041 Nontoxic single thyroid nodule: Secondary | ICD-10-CM

## 2015-02-07 ENCOUNTER — Other Ambulatory Visit: Payer: Self-pay | Admitting: Internal Medicine

## 2015-02-07 DIAGNOSIS — E041 Nontoxic single thyroid nodule: Secondary | ICD-10-CM

## 2015-02-10 ENCOUNTER — Ambulatory Visit
Admission: RE | Admit: 2015-02-10 | Discharge: 2015-02-10 | Disposition: A | Discharge status: Home / Self Care | Payer: Medicare Other | Source: Ambulatory Visit | Attending: Internal Medicine | Admitting: Internal Medicine

## 2015-02-10 ENCOUNTER — Other Ambulatory Visit (HOSPITAL_COMMUNITY)
Admission: RE | Admit: 2015-02-10 | Discharge: 2015-02-10 | Disposition: A | Discharge status: Home / Self Care | Payer: Medicare Other | Source: Ambulatory Visit | Attending: Physician Assistant | Admitting: Physician Assistant

## 2015-02-10 DIAGNOSIS — E042 Nontoxic multinodular goiter: Secondary | ICD-10-CM | POA: Insufficient documentation

## 2015-02-10 DIAGNOSIS — E041 Nontoxic single thyroid nodule: Secondary | ICD-10-CM

## 2015-02-10 NOTE — Procedures (Signed)
Using direct ultrasound guidance, 3 passes were made using needles into the nodules within the right lobe and isthmus of the thyroid.   Ultrasound was used to confirm needle placements on all occasions.   Specimens were sent to Pathology for analysis.   Aloma Boch S Karyssa Amaral PA-C 11/02/2014 1:43 PM

## 2015-02-17 ENCOUNTER — Other Ambulatory Visit: Payer: Medicare Other

## 2015-02-17 ENCOUNTER — Ambulatory Visit: Payer: Medicare Other | Admitting: Hematology and Oncology

## 2015-02-21 ENCOUNTER — Ambulatory Visit: Payer: Medicare Other | Admitting: Hematology and Oncology

## 2015-02-21 ENCOUNTER — Other Ambulatory Visit: Payer: Self-pay

## 2015-02-21 ENCOUNTER — Other Ambulatory Visit: Payer: Medicare Other

## 2015-02-21 DIAGNOSIS — C50511 Malignant neoplasm of lower-outer quadrant of right female breast: Secondary | ICD-10-CM

## 2015-02-22 ENCOUNTER — Encounter: Payer: Self-pay | Admitting: Hematology and Oncology

## 2015-02-22 ENCOUNTER — Ambulatory Visit (HOSPITAL_BASED_OUTPATIENT_CLINIC_OR_DEPARTMENT_OTHER): Payer: Medicare Other | Admitting: Hematology and Oncology

## 2015-02-22 ENCOUNTER — Other Ambulatory Visit (HOSPITAL_BASED_OUTPATIENT_CLINIC_OR_DEPARTMENT_OTHER): Payer: Medicare Other

## 2015-02-22 ENCOUNTER — Telehealth: Payer: Self-pay | Admitting: Hematology and Oncology

## 2015-02-22 VITALS — BP 150/60 | HR 62 | Temp 97.8°F | Resp 18 | Wt 147.4 lb

## 2015-02-22 DIAGNOSIS — Z86000 Personal history of in-situ neoplasm of breast: Secondary | ICD-10-CM

## 2015-02-22 DIAGNOSIS — D696 Thrombocytopenia, unspecified: Secondary | ICD-10-CM

## 2015-02-22 DIAGNOSIS — Z79811 Long term (current) use of aromatase inhibitors: Secondary | ICD-10-CM

## 2015-02-22 DIAGNOSIS — C50511 Malignant neoplasm of lower-outer quadrant of right female breast: Secondary | ICD-10-CM

## 2015-02-22 LAB — CBC WITH DIFFERENTIAL/PLATELET
BASO%: 0.3 % (ref 0.0–2.0)
BASOS ABS: 0 10*3/uL (ref 0.0–0.1)
EOS ABS: 0.2 10*3/uL (ref 0.0–0.5)
EOS%: 3 % (ref 0.0–7.0)
HEMATOCRIT: 38.3 % (ref 34.8–46.6)
HEMOGLOBIN: 12.2 g/dL (ref 11.6–15.9)
LYMPH%: 31.7 % (ref 14.0–49.7)
MCH: 27.5 pg (ref 25.1–34.0)
MCHC: 31.8 g/dL (ref 31.5–36.0)
MCV: 86.5 fL (ref 79.5–101.0)
MONO#: 0.9 10*3/uL (ref 0.1–0.9)
MONO%: 13.3 % (ref 0.0–14.0)
NEUT#: 3.6 10*3/uL (ref 1.5–6.5)
NEUT%: 51.7 % (ref 38.4–76.8)
PLATELETS: 137 10*3/uL — AB (ref 145–400)
RBC: 4.43 10*6/uL (ref 3.70–5.45)
RDW: 13.5 % (ref 11.2–14.5)
WBC: 7 10*3/uL (ref 3.9–10.3)
lymph#: 2.2 10*3/uL (ref 0.9–3.3)

## 2015-02-22 LAB — COMPREHENSIVE METABOLIC PANEL
ALBUMIN: 3.2 g/dL — AB (ref 3.5–5.0)
ALK PHOS: 62 U/L (ref 40–150)
ALT: <9 U/L (ref 0–55)
ANION GAP: 9 meq/L (ref 3–11)
AST: 23 U/L (ref 5–34)
BUN: 19.8 mg/dL (ref 7.0–26.0)
CALCIUM: 9.1 mg/dL (ref 8.4–10.4)
CO2: 26 mEq/L (ref 22–29)
CREATININE: 0.9 mg/dL (ref 0.6–1.1)
Chloride: 105 mEq/L (ref 98–109)
EGFR: 71 mL/min/{1.73_m2} — ABNORMAL LOW (ref >90)
Glucose: 102 mg/dl (ref 70–140)
POTASSIUM: 4.2 meq/L (ref 3.5–5.1)
Sodium: 140 mEq/L (ref 136–145)
Total Bilirubin: 0.31 mg/dL (ref 0.20–1.20)
Total Protein: 7.1 g/dL (ref 6.4–8.3)

## 2015-02-22 NOTE — Assessment & Plan Note (Signed)
Right breast DCIS intermediate grade ER/PR positive status post mastectomy currently on hormonal therapy with tamoxifen 20 mg daily since 2013  Tamoxifen toxicities: No side effects of tamoxifen Patient will complete 5 years of therapy by Sept 2018.  Breast cancer surveillance: 1. Mammograms done June 2016 were without any evidence of malignancy. Breast density category C 2. Breasts exam done 02/22/2015 is without any lumps or nodules Return to clinic in 1 year for follow-up

## 2015-02-22 NOTE — Telephone Encounter (Signed)
Appointment made and avs printed °

## 2015-02-22 NOTE — Progress Notes (Signed)
Patient Care Team: [name], MD as PCP - General (Internal Medicine) Nicholas Lose, MD as Consulting Physician (Hematology and Oncology) Sable Feil, MD as Consulting Physician (Gastroenterology) Coralie Keens, MD as Consulting Physician (General Surgery)  DIAGNOSIS: Primary cancer of lower outer quadrant of right female breast [facility])   Staging form: Breast, AJCC 7th Edition     Clinical: Tis, N0, cM0 - Unsigned       Staging comments: Staged In Conference 6.19.13      Pathologic: No stage assigned - Unsigned  SUMMARY OF ONCOLOGIC HISTORY:   Primary cancer of lower outer quadrant of right female breast ([place])   08/13/2011 Surgery Right Breast DCIS intermediate grade, 2 foci 1.2 cm and 1.1 cm, ER 100%, PR 76% S/P mastectomy   09/12/2011 -  Anti-estrogen oral therapy Tamoxifen 20 mg daily Plan is for 5 years   CHIEF COMPLIANT: F/U on Tamoxifen  INTERVAL HISTORY: Robin Huynh is a 81 yr old with above-mentioned history of right breast DCIS underwent mastectomy and is now on tamoxifen for the past 3-1/2 years. She appears to be tolerating it very well without any hot flashes or myalgias. Last year's mammogram was normal. She denies any lumps or nodules breasts.  REVIEW OF SYSTEMS:   Constitutional: Denies fevers, chills or abnormal weight loss Eyes: Denies blurriness of vision Ears, nose, mouth, throat, and face: Denies mucositis or sore throat Respiratory: Denies cough, dyspnea or wheezes Cardiovascular: Denies palpitation, chest discomfort Gastrointestinal:  Denies nausea, heartburn or change in bowel habits Skin: Denies abnormal skin rashes Lymphatics: Denies new lymphadenopathy or easy bruising Neurological:Denies numbness, tingling or new weaknesses Behavioral/Psych: Mood is stable, no new changes  Extremities: No lower extremity edema Breast:  denies any pain or lumps or nodules in either breasts All other systems were reviewed with the patient and are  negative.  I have reviewed the past medical history, past surgical history, social history and family history with the patient and they are unchanged from previous note.  ALLERGIES:  has No Known Allergies.  MEDICATIONS:  Current Outpatient Prescriptions  Medication Sig Dispense Refill  . aspirin 81 MG tablet Take 81 mg by mouth daily.    Marland Kitchen CALCIUM PO Take 1 tablet by mouth daily.    . Cholecalciferol (VITAMIN D) 2000 UNITS tablet Take 2,000 Units by mouth 3 (three) times daily.    . fish oil-omega-3 fatty acids 1000 MG capsule Take 2 g by mouth daily.    Marland Kitchen levothyroxine (SYNTHROID, LEVOTHROID) 100 MCG tablet Take 50-100 mcg by mouth daily. Take 100 mcg on Mon wed and fri and 1/2 tablet the rest of the week    . magnesium 30 MG tablet Take 30 mg by mouth 2 (two) times daily.    . Multiple Vitamin (MULTIVITAMIN) tablet Take 1 tablet by mouth daily.    . Red Yeast Rice 600 MG CAPS Take 600 mg by mouth daily.     . tamoxifen (NOLVADEX) 20 MG tablet Take 1 tablet (20 mg total) by mouth daily. 90 tablet 10  . vitamin C (ASCORBIC ACID) 500 MG tablet Take 500 mg by mouth daily.     No current facility-administered medications for this visit.    PHYSICAL EXAMINATION: ECOG PERFORMANCE STATUS: 0 - Asymptomatic  Filed Vitals:   02/22/15 1035  BP: 150/60  Pulse: 62  Temp: 97.8 F (36.6 C)  Resp: 18   Filed Weights   02/22/15 1035  Weight: 147 lb 6.4 oz (66.86 kg)  GENERAL:alert, no distress and comfortable SKIN: skin color, texture, turgor are normal, no rashes or significant lesions EYES: normal, Conjunctiva are pink and non-injected, sclera clear OROPHARYNX:no exudate, no erythema and lips, buccal mucosa, and tongue normal  NECK: supple, thyroid normal size, non-tender, without nodularity LYMPH:  no palpable lymphadenopathy in the cervical, axillary or inguinal LUNGS: clear to auscultation and percussion with normal breathing effort HEART: regular rate & rhythm and no murmurs  and no lower extremity edema ABDOMEN:abdomen soft, non-tender and normal bowel sounds MUSCULOSKELETAL:no cyanosis of digits and no clubbing  NEURO: alert & oriented x 3 with fluent speech, no focal motor/sensory deficits EXTREMITIES: No lower extremity edema BREAST: No palpable masses or nodules in either right or left breasts. No palpable axillary supraclavicular or infraclavicular adenopathy no breast tenderness or nipple discharge. (exam performed in the presence of a chaperone)  LABORATORY DATA:  I have reviewed the data as listed   Chemistry      Component Value Date/Time   NA 140 01/27/2015 1144   NA 139 02/11/2014 1430   K 4.2 01/27/2015 1144   K 4.4 02/11/2014 1430   CL 105 01/27/2015 1144   CL 106 11/26/2011 1129   CO2 27 01/27/2015 1144   CO2 26 02/11/2014 1430   BUN 22 01/27/2015 1144   BUN 21.6 02/11/2014 1430   CREATININE 0.78 01/27/2015 1144   CREATININE 0.9 02/11/2014 1430   CREATININE 0.90 08/11/2011 0641      Component Value Date/Time   CALCIUM 9.4 01/27/2015 1144   CALCIUM 9.1 02/11/2014 1430   ALKPHOS 44 01/27/2015 1144   ALKPHOS 66 02/11/2014 1430   AST 23 01/27/2015 1144   AST 26 02/11/2014 1430   ALT 6 01/27/2015 1144   ALT 8 02/11/2014 1430   BILITOT 0.3 01/27/2015 1144   BILITOT 0.21 02/11/2014 1430      Lab Results  Component Value Date   WBC 7.0 02/22/2015   HGB 12.2 02/22/2015   HCT 38.3 02/22/2015   MCV 86.5 02/22/2015   PLT 137* 02/22/2015   NEUTROABS 3.6 02/22/2015   ASSESSMENT & PLAN:  Primary cancer of lower outer quadrant of right female breast Right breast DCIS intermediate grade ER/PR positive status post mastectomy currently on hormonal therapy with tamoxifen 20 mg daily since 2013  Tamoxifen toxicities: No side effects of tamoxifen Patient will complete 5 years of therapy by Sept 2018.  Breast cancer surveillance: 1. Mammograms done June 2016 were without any evidence of malignancy. Breast density category C 2. Breasts  exam done 02/22/2015 is without any lumps or nodules  Blood work reveals mild thrombocytopenia with a platelet count of 137. I do not think it is of clinical significance. We do not have the repeat blood work because blood work does not reveal breast cancer.  Return to clinic in 1 year for follow-up  No orders of the defined types were placed in this encounter.   The patient has a good understanding of the overall plan. she agrees with it. she will call with any problems that may develop before the next visit here.   Rulon Eisenmenger, MD 02/22/2015

## 2015-04-15 ENCOUNTER — Ambulatory Visit: Payer: Self-pay | Admitting: Internal Medicine

## 2015-05-03 ENCOUNTER — Other Ambulatory Visit: Payer: Self-pay | Admitting: Hematology and Oncology

## 2015-05-03 ENCOUNTER — Ambulatory Visit: Payer: Self-pay | Admitting: Internal Medicine

## 2015-05-03 ENCOUNTER — Other Ambulatory Visit: Payer: Self-pay | Admitting: Internal Medicine

## 2015-05-03 NOTE — Telephone Encounter (Signed)
Chart reviewed.

## 2015-05-26 ENCOUNTER — Ambulatory Visit (INDEPENDENT_AMBULATORY_CARE_PROVIDER_SITE_OTHER): Payer: Medicare Other | Admitting: Internal Medicine

## 2015-05-26 ENCOUNTER — Encounter: Payer: Self-pay | Admitting: Internal Medicine

## 2015-05-26 VITALS — BP 110/62 | HR 64 | Temp 97.9°F | Resp 16 | Ht 60.5 in | Wt 146.6 lb

## 2015-05-26 DIAGNOSIS — E559 Vitamin D deficiency, unspecified: Secondary | ICD-10-CM | POA: Diagnosis not present

## 2015-05-26 DIAGNOSIS — I10 Essential (primary) hypertension: Secondary | ICD-10-CM

## 2015-05-26 DIAGNOSIS — Z79899 Other long term (current) drug therapy: Secondary | ICD-10-CM | POA: Diagnosis not present

## 2015-05-26 DIAGNOSIS — E782 Mixed hyperlipidemia: Secondary | ICD-10-CM

## 2015-05-26 DIAGNOSIS — R0989 Other specified symptoms and signs involving the circulatory and respiratory systems: Secondary | ICD-10-CM

## 2015-05-26 DIAGNOSIS — E039 Hypothyroidism, unspecified: Secondary | ICD-10-CM

## 2015-05-26 DIAGNOSIS — R7303 Prediabetes: Secondary | ICD-10-CM | POA: Diagnosis not present

## 2015-05-26 LAB — CBC WITH DIFFERENTIAL/PLATELET
BASOS PCT: 0 %
Basophils Absolute: 0 cells/uL (ref 0–200)
EOS ABS: 170 {cells}/uL (ref 15–500)
Eosinophils Relative: 2 %
HEMATOCRIT: 37.5 % (ref 35.0–45.0)
Hemoglobin: 12.4 g/dL (ref 11.7–15.5)
LYMPHS PCT: 32 %
Lymphs Abs: 2720 cells/uL (ref 850–3900)
MCH: 27.6 pg (ref 27.0–33.0)
MCHC: 33.1 g/dL (ref 32.0–36.0)
MCV: 83.5 fL (ref 80.0–100.0)
MONO ABS: 425 {cells}/uL (ref 200–950)
MPV: 11.7 fL (ref 7.5–12.5)
Monocytes Relative: 5 %
Neutro Abs: 5185 cells/uL (ref 1500–7800)
Neutrophils Relative %: 61 %
Platelets: 159 10*3/uL (ref 140–400)
RBC: 4.49 MIL/uL (ref 3.80–5.10)
RDW: 14.8 % (ref 11.0–15.0)
WBC: 8.5 10*3/uL (ref 3.8–10.8)

## 2015-05-26 NOTE — Progress Notes (Signed)
Patient ID: Robin Huynh, female   DOB: 1934/12/20, 81 y.o.   MRN: [id number]  [facility]                       [name], M.D.        Uvaldo Bristle. Silverio Lay, P.A.-C       [name], P.A.-C   [facility]                [address] [place], [place] [id number] Telephone [phone] Telefax [phone] _________________________________________________________________________     This very nice 81 y.o. MBF presents for 6 month follow up with Hypertension, Hyperlipidemia, Pre-Diabetes and Vitamin D Deficiency.     Patient is treated for HTN circa 2000 & BP has been controlled at home. Today's BP: 110/62 mmHg. Patient has had no complaints of any cardiac type chest pain, palpitations, dyspnea/orthopnea/PND, dizziness, claudication, or dependent edema.    Hyperlipidemia is controlled with diet & meds. Patient denies myalgias or other med SE's. Last Lipids were at goal with Cholesterol 164; HDL 76; LDL 77; Triglycerides 55 on 01/27/2015.    Also, the patient has history of PreDiabetes since 2011 with A1c 6.1%  And then 6.4% in 2012 and she is attempting to control by dieting. She has had no symptoms of reactive hypoglycemia, diabetic polys, paresthesias or visual blurring.  Last A1c was 6.3% on 01/27/2015.    Further, the patient also has history of Vitamin D Deficiency and supplements vitamin D without any suspected side-effects. Last vitamin D was 84 on 10/07/2014.   Medication Sig  . aspirin 81 MG tablet Take 81 mg by mouth daily.  Marland Kitchen CALCIUM PO Take 1 tablet by mouth daily.  Marland Kitchen VITAMIN D 2000 UNITS Take 2,000 Units by mouth 3 (three) times daily.  . fish oil-omega 1000 MG  Take 2 g by mouth daily.  . magnesium 30 MG tablet Take 30 mg by mouth 2 (two) times daily.  . Multiple Vitamin  tablet Take 1 tablet by mouth daily.  . Red Yeast Rice 600 MG Take 600 mg by mouth daily.   . tamoxifen  20 MG tablet  TAKE ONE TABLET BY MOUTH ONCE DAILY  . vitamin C 500 MG tablet Take 500 mg by mouth daily.  Marland Kitchen levothyroxine  100 MCG  Take 50-100 mcg by mouth daily. Take 100 mcg on Mon wed and fri and 1/2 tablet the rest of the week  . levothyroxine  50 MCG  TAKE ONE TABLET BY MOUTH ONCE DAILY   No Known Allergies  PMHx:   Past Medical History  Diagnosis Date  . Breast cancer ([place]) 08/2011    right  . Labile hypertension   . Hypothyroidism   . Prediabetes   . Vitamin D deficiency   . Diverticulosis   . Thoracic aorta atherosclerosis ([place])     via CXR   Immunization History  Administered Date(s) Administered  . Pneumococcal Conjugate-13 03/01/2014  . Pneumococcal Polysaccharide-23 01/09/2007  . Td 05/18/2008   Past Surgical History  Procedure Laterality Date  . Breast surgery      right breast  . Colonoscopy    . Mastectomy complete / simple w/ sentinel node biopsy  08/10/11    right  . Mastectomy w/ sentinel node biopsy  08/10/2011    Procedure: MASTECTOMY WITH SENTINEL LYMPH NODE BIOPSY;  Surgeon: Harl Bowie, MD;  Location: [facility];  Service: General;  Laterality: Right;   FHx:    Reviewed / unchanged  SHx:    Reviewed / unchanged  Systems Review:  Constitutional: Denies fever, chills, wt changes, headaches, insomnia, fatigue, night sweats, change in appetite. Eyes: Denies redness, blurred vision, diplopia, discharge, itchy, watery eyes.  ENT: Denies discharge, congestion, post nasal drip, epistaxis, sore throat, earache, hearing loss, dental pain, tinnitus, vertigo, sinus pain, snoring.  CV: Denies chest pain, palpitations, irregular heartbeat, syncope, dyspnea, diaphoresis, orthopnea, PND, claudication or edema. Respiratory: denies cough, dyspnea, DOE, pleurisy, hoarseness, laryngitis, wheezing.  Gastrointestinal: Denies dysphagia, odynophagia, heartburn, reflux, water brash, abdominal pain or cramps, nausea, vomiting, bloating, diarrhea, constipation, hematemesis, melena,  hematochezia  or hemorrhoids. Genitourinary: Denies dysuria, frequency, urgency, nocturia, hesitancy, discharge, hematuria or flank pain. Musculoskeletal: Denies arthralgias, myalgias, stiffness, jt. swelling, pain, limping or strain/sprain.  Skin: Denies pruritus, rash, hives, warts, acne, eczema or change in skin lesion(s). Neuro: No weakness, tremor, incoordination, spasms, paresthesia or pain. Psychiatric: Denies confusion, memory loss or sensory loss. Endo: Denies change in weight, skin or hair change.  Heme/Lymph: No excessive bleeding, bruising or enlarged lymph nodes.  Physical Exam  BP 110/62 mmHg  Pulse 64  Temp(Src) 97.9 F (36.6 C)  Resp 16  Ht 5' 0.5" (1.537 m)  Wt 146 lb 9.6 oz (66.497 kg)  BMI 28.15 kg/m2  Appears well nourished and in no distress. Eyes: PERRLA, EOMs, conjunctiva no swelling or erythema. Sinuses: No frontal/maxillary tenderness ENT/Mouth: EAC's clear, TM's nl w/o erythema, bulging. Nares clear w/o erythema, swelling, exudates. Oropharynx clear without erythema or exudates. Oral hygiene is good. Tongue normal, non obstructing. Hearing intact.  Neck: Supple. Thyroid nl. Car 2+/2+ without bruits, nodes or JVD. Chest: Respirations nl with BS clear & equal w/o rales, rhonchi, wheezing or stridor.  Cor: Heart sounds normal w/ regular rate and rhythm without sig. murmurs, gallops, clicks, or rubs. Peripheral pulses normal and equal  without edema.  Abdomen: Soft & bowel sounds normal. Non-tender w/o guarding, rebound, hernias, masses, or organomegaly.  Lymphatics: Unremarkable.  Musculoskeletal: Full ROM all peripheral extremities, joint stability, 5/5 strength, and normal gait.  Skin: Warm, dry without exposed rashes, lesions or ecchymosis apparent.  Neuro: Cranial nerves intact, reflexes equal bilaterally. Sensory-motor testing grossly intact. Tendon reflexes grossly intact.  Pysch: Alert & oriented x 3.  Insight and judgement nl & appropriate. No  ideations.  Assessment and Plan:  1. Labile hypertension (2000)  - TSH  2. Hyperlipidemia  - Lipid panel - TSH  3. Prediabetes (06/2009)  - Hemoglobin A1c - Insulin, random  4. Vitamin D deficiency  - VITAMIN D 25 Hydroxy   5. Hypothyroidism  - TSH  6. Medication management  - CBC with Differential/Platelet - BASIC METABOLIC PANEL WITH GFR - Hepatic function panel - Magnesium   Recommended regular exercise, BP monitoring, weight control, and discussed med and SE's. Recommended labs to assess and monitor clinical status. Further disposition pending results of labs. Over 30 minutes of exam, counseling, chart review was performed

## 2015-05-26 NOTE — Patient Instructions (Signed)

## 2015-05-27 ENCOUNTER — Encounter: Payer: Self-pay | Admitting: Internal Medicine

## 2015-05-27 LAB — BASIC METABOLIC PANEL WITH GFR
BUN: 26 mg/dL — AB (ref 7–25)
CHLORIDE: 101 mmol/L (ref 98–110)
CO2: 27 mmol/L (ref 20–31)
Calcium: 9.4 mg/dL (ref 8.6–10.4)
Creat: 0.94 mg/dL — ABNORMAL HIGH (ref 0.60–0.93)
GFR, Est African American: 67 mL/min (ref >=60)
GFR, Est Non African American: 58 mL/min — ABNORMAL LOW (ref >=60)
GLUCOSE: 92 mg/dL (ref 65–99)
POTASSIUM: 4.1 mmol/L (ref 3.5–5.3)
Sodium: 137 mmol/L (ref 135–146)

## 2015-05-27 LAB — INSULIN, RANDOM: INSULIN: 3.3 u[IU]/mL (ref 2.0–19.6)

## 2015-05-27 LAB — HEMOGLOBIN A1C
Hgb A1c MFr Bld: 6.1 % — ABNORMAL HIGH (ref <5.7)
MEAN PLASMA GLUCOSE: 128 mg/dL

## 2015-05-27 LAB — HEPATIC FUNCTION PANEL
ALBUMIN: 3.8 g/dL (ref 3.6–5.1)
ALK PHOS: 53 U/L (ref 33–130)
ALT: 6 U/L (ref 6–29)
AST: 26 U/L (ref 10–35)
Bilirubin, Direct: 0.1 mg/dL (ref <=0.2)
Indirect Bilirubin: 0.2 mg/dL (ref 0.2–1.2)
TOTAL PROTEIN: 7.1 g/dL (ref 6.1–8.1)
Total Bilirubin: 0.3 mg/dL (ref 0.2–1.2)

## 2015-05-27 LAB — LIPID PANEL
Cholesterol: 159 mg/dL (ref 125–200)
HDL: 87 mg/dL (ref >=46)
LDL CALC: 60 mg/dL (ref <130)
Total CHOL/HDL Ratio: 1.8 Ratio (ref <=5.0)
Triglycerides: 60 mg/dL (ref <150)
VLDL: 12 mg/dL (ref <30)

## 2015-05-27 LAB — VITAMIN D 25 HYDROXY (VIT D DEFICIENCY, FRACTURES): Vit D, 25-Hydroxy: 82 ng/mL (ref 30–100)

## 2015-05-27 LAB — MAGNESIUM: Magnesium: 1.8 mg/dL (ref 1.5–2.5)

## 2015-05-27 LAB — TSH: TSH: 0.31 m[IU]/L — AB

## 2015-08-31 ENCOUNTER — Ambulatory Visit: Payer: Self-pay | Admitting: Physician Assistant

## 2015-08-31 ENCOUNTER — Encounter: Payer: Self-pay | Admitting: Internal Medicine

## 2015-08-31 ENCOUNTER — Ambulatory Visit (INDEPENDENT_AMBULATORY_CARE_PROVIDER_SITE_OTHER): Payer: Medicare Other | Admitting: Internal Medicine

## 2015-08-31 VITALS — BP 130/70 | HR 57 | Temp 98.6°F | Resp 16 | Ht 60.5 in | Wt 146.4 lb

## 2015-08-31 DIAGNOSIS — J309 Allergic rhinitis, unspecified: Secondary | ICD-10-CM | POA: Diagnosis not present

## 2015-08-31 DIAGNOSIS — C50511 Malignant neoplasm of lower-outer quadrant of right female breast: Secondary | ICD-10-CM | POA: Diagnosis not present

## 2015-08-31 DIAGNOSIS — R0989 Other specified symptoms and signs involving the circulatory and respiratory systems: Secondary | ICD-10-CM

## 2015-08-31 DIAGNOSIS — I10 Essential (primary) hypertension: Secondary | ICD-10-CM

## 2015-08-31 DIAGNOSIS — E559 Vitamin D deficiency, unspecified: Secondary | ICD-10-CM | POA: Diagnosis not present

## 2015-08-31 DIAGNOSIS — Z79899 Other long term (current) drug therapy: Secondary | ICD-10-CM

## 2015-08-31 DIAGNOSIS — R7303 Prediabetes: Secondary | ICD-10-CM | POA: Diagnosis not present

## 2015-08-31 DIAGNOSIS — E785 Hyperlipidemia, unspecified: Secondary | ICD-10-CM | POA: Diagnosis not present

## 2015-08-31 DIAGNOSIS — E039 Hypothyroidism, unspecified: Secondary | ICD-10-CM | POA: Diagnosis not present

## 2015-08-31 LAB — BASIC METABOLIC PANEL WITH GFR
BUN: 22 mg/dL (ref 7–25)
CALCIUM: 9.4 mg/dL (ref 8.6–10.4)
CO2: 26 mmol/L (ref 20–31)
CREATININE: 0.89 mg/dL (ref 0.60–0.93)
Chloride: 102 mmol/L (ref 98–110)
GFR, EST AFRICAN AMERICAN: 71 mL/min (ref >=60)
GFR, Est Non African American: 62 mL/min (ref >=60)
GLUCOSE: 81 mg/dL (ref 65–99)
Potassium: 4.4 mmol/L (ref 3.5–5.3)
Sodium: 136 mmol/L (ref 135–146)

## 2015-08-31 LAB — CBC WITH DIFFERENTIAL/PLATELET
BASOS PCT: 0 %
Basophils Absolute: 0 cells/uL (ref 0–200)
EOS ABS: 126 {cells}/uL (ref 15–500)
Eosinophils Relative: 2 %
HEMATOCRIT: 38.2 % (ref 35.0–45.0)
Hemoglobin: 12.6 g/dL (ref 11.7–15.5)
LYMPHS PCT: 36 %
Lymphs Abs: 2268 cells/uL (ref 850–3900)
MCH: 28 pg (ref 27.0–33.0)
MCHC: 33 g/dL (ref 32.0–36.0)
MCV: 84.9 fL (ref 80.0–100.0)
MONO ABS: 567 {cells}/uL (ref 200–950)
MONOS PCT: 9 %
MPV: 10.8 fL (ref 7.5–12.5)
NEUTROS PCT: 53 %
Neutro Abs: 3339 cells/uL (ref 1500–7800)
PLATELETS: 146 10*3/uL (ref 140–400)
RBC: 4.5 MIL/uL (ref 3.80–5.10)
RDW: 14.8 % (ref 11.0–15.0)
WBC: 6.3 10*3/uL (ref 3.8–10.8)

## 2015-08-31 LAB — LIPID PANEL
CHOL/HDL RATIO: 1.7 ratio (ref <=5.0)
CHOLESTEROL: 164 mg/dL (ref 125–200)
HDL: 99 mg/dL (ref >=46)
LDL Cholesterol: 55 mg/dL (ref <130)
Triglycerides: 52 mg/dL (ref <150)
VLDL: 10 mg/dL (ref <30)

## 2015-08-31 LAB — HEPATIC FUNCTION PANEL
ALBUMIN: 3.7 g/dL (ref 3.6–5.1)
ALT: 8 U/L (ref 6–29)
AST: 29 U/L (ref 10–35)
Alkaline Phosphatase: 48 U/L (ref 33–130)
BILIRUBIN TOTAL: 0.3 mg/dL (ref 0.2–1.2)
Bilirubin, Direct: 0.1 mg/dL (ref <=0.2)
Indirect Bilirubin: 0.2 mg/dL (ref 0.2–1.2)
TOTAL PROTEIN: 6.9 g/dL (ref 6.1–8.1)

## 2015-08-31 LAB — TSH: TSH: 0.86 mIU/L

## 2015-08-31 MED ORDER — FLUTICASONE PROPIONATE 50 MCG/ACT NA SUSP: 2.0000 | Freq: Every day | NASAL | 0 refills | Status: DC

## 2015-08-31 NOTE — Progress Notes (Signed)
Assessment and Plan:  Hypertension:  -Continue medication,  -monitor blood pressure at home.  -Continue DASH diet.   -Reminder to go to the ER if any CP, SOB, nausea, dizziness, severe HA, changes vision/speech, left arm numbness and tingling, and jaw pain.  Cholesterol: -Continue diet and exercise.  -Check cholesterol.   Pre-diabetes: -Continue diet and exercise.  -Check A1C  Vitamin D Def: -check level -continue medications.   Breast Cancer -seen by Dr. [place] Adie -cont tamoxifen  Allergic rhinitis -cont antihistamine -flonase sent in  Continue diet and meds as discussed. Further disposition pending results of labs.  HPI 81 y.o. female  presents for 3 month follow up with hypertension, hyperlipidemia, prediabetes and vitamin D.   Her blood pressure has been controlled at home, today their BP is BP: 130/70.   She does workout. She denies chest pain, shortness of breath, dizziness.   She is on cholesterol medication and denies myalgias. Her cholesterol is at goal. The cholesterol last visit was:   Lab Results  Component Value Date   CHOL 159 05/26/2015   HDL 87 05/26/2015   LDLCALC 60 05/26/2015   TRIG 60 05/26/2015   CHOLHDL 1.8 05/26/2015     She has been working on diet and exercise for prediabetes, and denies foot ulcerations, hyperglycemia, hypoglycemia , increased appetite, nausea, paresthesia of the feet, polydipsia, polyuria, visual disturbances, vomiting and weight loss. Last A1C in the office was:  Lab Results  Component Value Date   HGBA1C 6.1 (H) 05/26/2015    Patient is on Vitamin D supplement.  Lab Results  Component Value Date   VD25OH 62 05/26/2015     She has been having some issues with allergies.  She is taking an allergy tablet.  She reports that she is not having a whole lot of relief.   She has had a visit with Dr. Lindi Adie in February for her breast cancer.  She had a good follow-up.  No hot flashes with tamoxifen.  She does have some dry  mouth with it.    Current Medications:  Current Outpatient Prescriptions on File Prior to Visit  Medication Sig Dispense Refill  . aspirin 81 MG tablet Take 81 mg by mouth daily.    Marland Kitchen CALCIUM PO Take 1 tablet by mouth daily.    . Cholecalciferol (VITAMIN D) 2000 UNITS tablet Take 2,000 Units by mouth 3 (three) times daily.    . fish oil-omega-3 fatty acids 1000 MG capsule Take 2 g by mouth daily.    Marland Kitchen levothyroxine (SYNTHROID, LEVOTHROID) 50 MCG tablet     . magnesium 30 MG tablet Take 30 mg by mouth 2 (two) times daily.    . Multiple Vitamin (MULTIVITAMIN) tablet Take 1 tablet by mouth daily.    . Red Yeast Rice 600 MG CAPS Take 600 mg by mouth daily.     . tamoxifen (NOLVADEX) 20 MG tablet TAKE ONE TABLET BY MOUTH ONCE DAILY 90 tablet 3  . vitamin C (ASCORBIC ACID) 500 MG tablet Take 500 mg by mouth daily.     No current facility-administered medications on file prior to visit.     Medical History:  Past Medical History:  Diagnosis Date  . Breast cancer ([place]) 08/2011   right  . Diverticulosis   . Hypothyroidism   . Labile hypertension   . Prediabetes   . Thoracic aorta atherosclerosis ([place])    via CXR  . Vitamin D deficiency     Allergies: No Known Allergies   Review  of Systems:  Review of Systems  Constitutional: Negative for chills, fever and malaise/fatigue.  HENT: Negative for congestion, ear pain and sore throat.   Eyes: Negative.   Respiratory: Negative for cough, shortness of breath and wheezing.   Cardiovascular: Negative for chest pain, palpitations and leg swelling.  Gastrointestinal: Negative for abdominal pain, blood in stool, constipation, diarrhea, heartburn and melena.  Genitourinary: Negative.   Skin: Negative.   Neurological: Negative for dizziness, sensory change, loss of consciousness and headaches.  Psychiatric/Behavioral: Negative for depression. The patient is not nervous/anxious and does not have insomnia.     Family history- Review and  unchanged  Social history- Review and unchanged  Physical Exam: BP 130/70   Pulse (!) 57   Temp 98.6 F (37 C)   Resp 16   Ht 5' 0.5" (1.537 m)   Wt 146 lb 6.4 oz (66.4 kg)   SpO2 97%   BMI 28.12 kg/m  Wt Readings from Last 3 Encounters:  08/31/15 146 lb 6.4 oz (66.4 kg)  05/26/15 146 lb 9.6 oz (66.5 kg)  02/22/15 147 lb 6.4 oz (66.9 kg)    General Appearance: Well nourished well developed, in no apparent distress. Eyes: PERRLA, EOMs, conjunctiva no swelling or erythema ENT/Mouth: Ear canals normal without obstruction, swelling, erythma, discharge.  TMs normal bilaterally.  Oropharynx moist, clear, without exudate, or postoropharyngeal swelling. Neck: Supple, thyroid normal,no cervical adenopathy  Respiratory: Respiratory effort normal, Breath sounds clear A&P without rhonchi, wheeze, or rale.  No retractions, no accessory usage. Cardio: RRR with no MRGs. Brisk peripheral pulses without edema. Right breast surgically absent Abdomen: Soft, + BS,  Non tender, no guarding, rebound, hernias, masses. Musculoskeletal: Full ROM, 5/5 strength, Normal gait Skin: Warm, dry without rashes, lesions, ecchymosis.  Neuro: Awake and oriented X 3, Cranial nerves intact. Normal muscle tone, no cerebellar symptoms. Psych: Normal affect, Insight and Judgment appropriate.    [name], PA-C 11:29 AM [facility]

## 2015-09-01 LAB — HEMOGLOBIN A1C
HEMOGLOBIN A1C: 5.8 % — AB (ref <5.7)
Mean Plasma Glucose: 120 mg/dL

## 2015-09-12 ENCOUNTER — Encounter: Payer: Self-pay | Admitting: *Deleted

## 2015-10-21 ENCOUNTER — Other Ambulatory Visit: Payer: Self-pay | Admitting: Internal Medicine

## 2015-11-04 ENCOUNTER — Encounter: Payer: Self-pay | Admitting: Internal Medicine

## 2015-12-13 ENCOUNTER — Ambulatory Visit (INDEPENDENT_AMBULATORY_CARE_PROVIDER_SITE_OTHER): Payer: Medicare Other | Admitting: Internal Medicine

## 2015-12-13 ENCOUNTER — Encounter: Payer: Self-pay | Admitting: Internal Medicine

## 2015-12-13 VITALS — BP 132/80 | HR 64 | Temp 97.5°F | Resp 16 | Ht 60.0 in | Wt 151.4 lb

## 2015-12-13 DIAGNOSIS — E782 Mixed hyperlipidemia: Secondary | ICD-10-CM

## 2015-12-13 DIAGNOSIS — Z0000 Encounter for general adult medical examination without abnormal findings: Secondary | ICD-10-CM

## 2015-12-13 DIAGNOSIS — R7303 Prediabetes: Secondary | ICD-10-CM

## 2015-12-13 DIAGNOSIS — Z0001 Encounter for general adult medical examination with abnormal findings: Secondary | ICD-10-CM

## 2015-12-13 DIAGNOSIS — Z79899 Other long term (current) drug therapy: Secondary | ICD-10-CM

## 2015-12-13 DIAGNOSIS — I10 Essential (primary) hypertension: Secondary | ICD-10-CM

## 2015-12-13 DIAGNOSIS — C50511 Malignant neoplasm of lower-outer quadrant of right female breast: Secondary | ICD-10-CM

## 2015-12-13 DIAGNOSIS — Z136 Encounter for screening for cardiovascular disorders: Secondary | ICD-10-CM

## 2015-12-13 DIAGNOSIS — R0989 Other specified symptoms and signs involving the circulatory and respiratory systems: Secondary | ICD-10-CM

## 2015-12-13 DIAGNOSIS — E559 Vitamin D deficiency, unspecified: Secondary | ICD-10-CM

## 2015-12-13 DIAGNOSIS — Z1212 Encounter for screening for malignant neoplasm of rectum: Secondary | ICD-10-CM

## 2015-12-13 DIAGNOSIS — E039 Hypothyroidism, unspecified: Secondary | ICD-10-CM

## 2015-12-13 LAB — CBC WITH DIFFERENTIAL/PLATELET
BASOS ABS: 0 {cells}/uL (ref 0–200)
Basophils Relative: 0 %
EOS ABS: 128 {cells}/uL (ref 15–500)
Eosinophils Relative: 2 %
HCT: 38.7 % (ref 35.0–45.0)
HEMOGLOBIN: 12.4 g/dL (ref 11.7–15.5)
LYMPHS ABS: 2176 {cells}/uL (ref 850–3900)
Lymphocytes Relative: 34 %
MCH: 27.5 pg (ref 27.0–33.0)
MCHC: 32 g/dL (ref 32.0–36.0)
MCV: 85.8 fL (ref 80.0–100.0)
MONOS PCT: 9 %
MPV: 11 fL (ref 7.5–12.5)
Monocytes Absolute: 576 cells/uL (ref 200–950)
NEUTROS ABS: 3520 {cells}/uL (ref 1500–7800)
NEUTROS PCT: 55 %
Platelets: 165 10*3/uL (ref 140–400)
RBC: 4.51 MIL/uL (ref 3.80–5.10)
RDW: 15.1 % — ABNORMAL HIGH (ref 11.0–15.0)
WBC: 6.4 10*3/uL (ref 3.8–10.8)

## 2015-12-13 LAB — HEPATIC FUNCTION PANEL
ALBUMIN: 3.6 g/dL (ref 3.6–5.1)
ALT: 8 U/L (ref 6–29)
AST: 30 U/L (ref 10–35)
Alkaline Phosphatase: 46 U/L (ref 33–130)
Bilirubin, Direct: 0.1 mg/dL (ref <=0.2)
Indirect Bilirubin: 0.3 mg/dL (ref 0.2–1.2)
TOTAL PROTEIN: 6.8 g/dL (ref 6.1–8.1)
Total Bilirubin: 0.4 mg/dL (ref 0.2–1.2)

## 2015-12-13 LAB — BASIC METABOLIC PANEL WITH GFR
BUN: 18 mg/dL (ref 7–25)
CHLORIDE: 102 mmol/L (ref 98–110)
CO2: 24 mmol/L (ref 20–31)
CREATININE: 0.99 mg/dL — AB (ref 0.60–0.93)
Calcium: 9.2 mg/dL (ref 8.6–10.4)
GFR, Est African American: 63 mL/min (ref >=60)
GFR, Est Non African American: 54 mL/min — ABNORMAL LOW (ref >=60)
Glucose, Bld: 96 mg/dL (ref 65–99)
Potassium: 4.3 mmol/L (ref 3.5–5.3)
Sodium: 137 mmol/L (ref 135–146)

## 2015-12-13 LAB — LIPID PANEL
CHOL/HDL RATIO: 1.7 ratio (ref <5.0)
Cholesterol: 152 mg/dL (ref <200)
HDL: 88 mg/dL (ref >50)
LDL Cholesterol: 53 mg/dL (ref <100)
Triglycerides: 54 mg/dL (ref <150)
VLDL: 11 mg/dL (ref <30)

## 2015-12-13 LAB — HEMOGLOBIN A1C
HEMOGLOBIN A1C: 5.7 % — AB (ref <5.7)
MEAN PLASMA GLUCOSE: 117 mg/dL

## 2015-12-13 LAB — MAGNESIUM: MAGNESIUM: 1.8 mg/dL (ref 1.5–2.5)

## 2015-12-13 LAB — TSH: TSH: 1.12 m[IU]/L

## 2015-12-13 NOTE — Patient Instructions (Signed)

## 2015-12-13 NOTE — Progress Notes (Signed)
Robin Huynh [name], M.D.    Uvaldo Bristle. Silverio Lay, P.A.-C      [name], P.A.-C  [facility]                [address] [place], [place] [id number] Telephone [phone] Telefax [phone]  Annual Screening/Preventative Visit & Comprehensive Evaluation &  Examination     This very nice 81 y.o. MBF presents for a Screening/Preventative Visit & comprehensive evaluation and management of multiple medical co-morbidities.  Patient has been followed for HTN, T2_NIDDM  Prediabetes, Hyperlipidemia, Hypothyroidism  and Vitamin D Deficiency.     Patient underwent R Mastectomy in Aug 2013 for DCIS(ER+) ans is on Tamoxifen followed by Dr Lindi Adie.       HTN predates since circa 2000. Patient's BP has been controlled at home and patient denies any cardiac symptoms as chest pain, palpitations, shortness of breath, dizziness or ankle swelling. Today's BP is at goal - 132/80.      Patient's hyperlipidemia is controlled with diet and medications. Patient denies myalgias or other medication SE's. Last lipids were at goal: Lab Results  Component Value Date   CHOL 164 08/31/2015   HDL 99 08/31/2015   LDLCALC 55 08/31/2015   TRIG 52 08/31/2015   CHOLHDL 1.7 08/31/2015      Patient has prediabetes with A1c 6.1% predating since 2011 and then 6.4% in 2012. Patient denies reactive hypoglycemic symptoms, visual blurring, diabetic polys, or paresthesias. Last A1c was near goal: Lab Results  Component Value Date   HGBA1C 5.8 (H) 08/31/2015      Patient has been on Thyroid Replacement since 2011.   Finally, patient has history of Vitamin D Deficiency and last Vitamin D was at goal: Lab Results  Component Value Date   VD25OH 82 05/26/2015   Current Outpatient Prescriptions on File Prior to Visit  Medication Sig  . aspirin 81 MG tablet Take 81 mg by mouth daily.  Marland Kitchen CALCIUM PO Take 1 tablet by mouth daily.  .  Cholecalciferol (VITAMIN D) 2000 UNITS tablet Take 2,000 Units by mouth 3 (three) times daily.  . fish oil-omega-3 fatty acids 1000 MG capsule Take 2 g by mouth daily.  . fluticasone (FLONASE) 50 MCG/ACT nasal spray Place 2 sprays into both nostrils daily.  Marland Kitchen levothyroxine (SYNTHROID, LEVOTHROID) 50 MCG tablet TAKE ONE TABLET BY MOUTH ONCE DAILY  . magnesium 30 MG tablet Take 30 mg by mouth 2 (two) times daily.  . Multiple Vitamin (MULTIVITAMIN) tablet Take 1 tablet by mouth daily.  . Red Yeast Rice 600 MG CAPS Take 600 mg by mouth daily.   . tamoxifen (NOLVADEX) 20 MG tablet TAKE ONE TABLET BY MOUTH ONCE DAILY  . vitamin C (ASCORBIC ACID) 500 MG tablet Take 500 mg by mouth daily.   No current facility-administered medications on file prior to visit.    No Known Allergies Past Medical History:  Diagnosis Date  . Breast cancer ([place]) 08/2011   right  . Diverticulosis   . Hypothyroidism   . Labile hypertension   . Prediabetes   . Thoracic aorta atherosclerosis ([place])    via CXR  . Vitamin D deficiency    Health Maintenance  Topic Date Due  . INFLUENZA VACCINE  08/09/2015  . TETANUS/TDAP  05/19/2018  . DEXA SCAN  Completed  . ZOSTAVAX  Completed  . PNA vac Low Risk  Adult  Completed   Immunization History  Administered Date(s) Administered  . Pneumococcal Conjugate-13 03/01/2014  . Pneumococcal Polysaccharide-23 01/09/2007  . Td 05/18/2008   Past Surgical History:  Procedure Laterality Date  . BREAST SURGERY     right breast  . COLONOSCOPY    . MASTECTOMY COMPLETE / SIMPLE W/ SENTINEL NODE BIOPSY  08/10/11   right  . MASTECTOMY W/ SENTINEL NODE BIOPSY  08/10/2011   Procedure: MASTECTOMY WITH SENTINEL LYMPH NODE BIOPSY;  Surgeon: Harl Bowie, MD;  Location: [place];  Service: General;  Laterality: Right;   Family History  Problem Relation Age of Onset  . Colon cancer Mother 60  . Prostate cancer Father   . Lung cancer Brother   . Lung cancer Sister   . Kidney cancer  Brother    Social History  Substance Use Topics  . Smoking status: Never Smoker  . Smokeless tobacco: Never Used  . Alcohol use No    ROS Constitutional: Denies fever, chills, weight loss/gain, headaches, insomnia,  night sweats, and change in appetite. Does c/o fatigue. Eyes: Denies redness, blurred vision, diplopia, discharge, itchy, watery eyes.  ENT: Denies discharge, congestion, post nasal drip, epistaxis, sore throat, earache, hearing loss, dental pain, Tinnitus, Vertigo, Sinus pain, snoring.  Cardio: Denies chest pain, palpitations, irregular heartbeat, syncope, dyspnea, diaphoresis, orthopnea, PND, claudication, edema Respiratory: denies cough, dyspnea, DOE, pleurisy, hoarseness, laryngitis, wheezing.  Gastrointestinal: Denies dysphagia, heartburn, reflux, water brash, pain, cramps, nausea, vomiting, bloating, diarrhea, constipation, hematemesis, melena, hematochezia, jaundice, hemorrhoids Genitourinary: Denies dysuria, frequency, urgency, nocturia, hesitancy, discharge, hematuria, flank pain Breast: Breast lumps, nipple discharge, bleeding.  Musculoskeletal: Denies arthralgia, myalgia, stiffness, Jt. Swelling, pain, limp, and strain/sprain. Denies falls. Skin: Denies puritis, rash, hives, warts, acne, eczema, changing in skin lesion Neuro: No weakness, tremor, incoordination, spasms, paresthesia, pain Psychiatric: Denies confusion, memory loss, sensory loss. Denies Depression. Endocrine: Denies change in weight, skin, hair change, nocturia, and paresthesia, diabetic polys, visual blurring, hyper / hypo glycemic episodes.  Heme/Lymph: No excessive bleeding, bruising, enlarged lymph nodes.  Physical Exam  BP 132/80   Pulse 64   Temp 97.5 F (36.4 C)   Resp 16   Ht 5' (1.524 m)   Wt 151 lb 6.4 oz (68.7 kg)   BMI 29.57 kg/m   General Appearance: Well nourished and in no apparent distress.  Eyes: PERRLA, EOMs, conjunctiva no swelling or erythema, normal fundi and  vessels. Sinuses: No frontal/maxillary tenderness ENT/Mouth: EACs patent / TMs  nl. Nares clear without erythema, swelling, mucoid exudates. Oral hygiene is good. No erythema, swelling, or exudate. Tongue normal, non-obstructing. Tonsils not swollen or erythematous. Hearing normal.  Neck: Supple, thyroid normal. No bruits, nodes or JVD. Respiratory: Respiratory effort normal.  BS equal and clear bilateral without rales, rhonci, wheezing or stridor. Cardio: Heart sounds are normal with regular rate and rhythm and no murmurs, rubs or gallops. Peripheral pulses are normal and equal bilaterally without edema. No aortic or femoral bruits. Chest: symmetric with normal excursions and percussion. Breasts: Symmetric, without lumps, nipple discharge, retractions, or fibrocystic changes.  Abdomen: Flat, soft with bowel sounds active. Nontender, no guarding, rebound, hernias, masses, or organomegaly.  Lymphatics: Non tender without lymphadenopathy.  Musculoskeletal: Full ROM all peripheral extremities, joint stability, 5/5 strength, and normal gait. Skin: Warm and dry without rashes, lesions, cyanosis, clubbing or  ecchymosis.  Neuro: Cranial nerves intact, reflexes equal bilaterally. Normal muscle tone, no cerebellar symptoms. Sensation intact.  Pysch: Alert and oriented X 3, normal affect, Insight  and Judgment appropriate.   Assessment and Plan  1. Annual Preventative Screening Examination  2. Labile hypertension (2000)  - Microalbumin / creatinine urine ratio - EKG 12-Lead - Urinalysis, Routine w reflex microscopic - CBC with Differential/Platelet - BASIC METABOLIC PANEL WITH GFR - TSH  3. Mixed hyperlipidemia  - EKG 12-Lead - Lipid panel - TSH  4. Prediabetes (06/2009)  - EKG 12-Lead - Hemoglobin A1c - Insulin, random  5. Vitamin D deficiency  - VITAMIN D 25 Hydroxy   6. Hypothyroidism  - TSH  7. Primary cancer of lower outer quadrant of right female breast ([place])   8.  Screening for rectal cancer  - POC Hemoccult Bld/Stl   9. Screening for ischemic heart disease  - EKG 12-Lead  10. Medication management  - Urinalysis, Routine w reflex microscopic - CBC with Differential/Platelet - BASIC METABOLIC PANEL WITH GFR - Hepatic function panel - Magnesium       Continue prudent diet as discussed, weight control, BP monitoring, regular exercise, and medications. Discussed med's effects and SE's. Screening labs and tests as requested with regular follow-up as recommended. Over 40 minutes of exam, counseling, chart review and high complex critical decision making was performed.

## 2015-12-14 LAB — URINALYSIS, ROUTINE W REFLEX MICROSCOPIC
BILIRUBIN URINE: NEGATIVE
GLUCOSE, UA: NEGATIVE
HGB URINE DIPSTICK: NEGATIVE
Ketones, ur: NEGATIVE
Nitrite: NEGATIVE
PH: 6 (ref 5.0–8.0)
Protein, ur: NEGATIVE
Specific Gravity, Urine: 1.009 (ref 1.001–1.035)

## 2015-12-14 LAB — URINALYSIS, MICROSCOPIC ONLY
BACTERIA UA: NONE SEEN [HPF]
CASTS: NONE SEEN [LPF]
Crystals: NONE SEEN [HPF]
RBC / HPF: NONE SEEN RBC/HPF (ref <=2)
Yeast: NONE SEEN [HPF]

## 2015-12-14 LAB — MICROALBUMIN / CREATININE URINE RATIO: Creatinine, Urine: 56 mg/dL (ref 20–320)

## 2015-12-14 LAB — VITAMIN D 25 HYDROXY (VIT D DEFICIENCY, FRACTURES): VIT D 25 HYDROXY: 88 ng/mL (ref 30–100)

## 2015-12-14 LAB — INSULIN, RANDOM: INSULIN: 6.5 u[IU]/mL (ref 2.0–19.6)

## 2016-01-11 ENCOUNTER — Other Ambulatory Visit: Payer: Self-pay

## 2016-01-11 DIAGNOSIS — Z1212 Encounter for screening for malignant neoplasm of rectum: Secondary | ICD-10-CM

## 2016-01-11 LAB — POC HEMOCCULT BLD/STL (HOME/3-CARD/SCREEN)
Card #2 Fecal Occult Blod, POC: NEGATIVE
FECAL OCCULT BLD: NEGATIVE
Fecal Occult Blood, POC: NEGATIVE

## 2016-02-15 ENCOUNTER — Telehealth: Payer: Self-pay | Admitting: Hematology and Oncology

## 2016-02-15 NOTE — Telephone Encounter (Signed)
Called patient to confirm appointment change and get a voicemail.  Left a message of the change and if she had any questions or concerns to call [phone]

## 2016-02-22 ENCOUNTER — Ambulatory Visit (HOSPITAL_BASED_OUTPATIENT_CLINIC_OR_DEPARTMENT_OTHER): Payer: Medicare Other | Admitting: Hematology and Oncology

## 2016-02-22 ENCOUNTER — Encounter: Payer: Self-pay | Admitting: Hematology and Oncology

## 2016-02-22 DIAGNOSIS — C50511 Malignant neoplasm of lower-outer quadrant of right female breast: Secondary | ICD-10-CM

## 2016-02-22 DIAGNOSIS — Z86000 Personal history of in-situ neoplasm of breast: Secondary | ICD-10-CM

## 2016-02-22 DIAGNOSIS — Z79810 Long term (current) use of selective estrogen receptor modulators (SERMs): Secondary | ICD-10-CM

## 2016-02-22 MED ORDER — TAMOXIFEN CITRATE 20 MG PO TABS: 20.0000 mg | ORAL_TABLET | Freq: Every day | ORAL | 2 refills | Status: DC

## 2016-02-22 NOTE — Assessment & Plan Note (Signed)
Right breast DCIS intermediate grade ER/PR positive status post mastectomy currently on hormonal therapy with tamoxifen 20 mg daily since 2013  Tamoxifen toxicities: No side effects of tamoxifen Patient will complete 5 years of therapy by Sept 2018.  Breast cancer surveillance: 1. Mammograms done 07/14/2015 were without any evidence of malignancy. Breast density category C 2. Breasts exam done 02/22/2016 is without any lumps or nodules  Previous Blood work revealed mild thrombocytopenia with a platelet count of 137. It is of no clinical significance.  Return to clinic in 1 year for follow-up

## 2016-02-22 NOTE — Progress Notes (Signed)
Patient Care Team: [name], MD as PCP - General (Internal Medicine) Nicholas Lose, MD as Consulting Physician (Hematology and Oncology) Sable Feil, MD as Consulting Physician (Gastroenterology) Coralie Keens, MD as Consulting Physician (General Surgery)  DIAGNOSIS:  Encounter Diagnosis  Name Primary?  . Primary cancer of lower outer quadrant of right female breast ([place])     SUMMARY OF ONCOLOGIC HISTORY:   Primary cancer of lower outer quadrant of right female breast ([place])   08/13/2011 Surgery    Right Breast DCIS intermediate grade, 2 foci 1.2 cm and 1.1 cm, ER 100%, PR 76% S/P mastectomy      09/12/2011 -  Anti-estrogen oral therapy    Tamoxifen 20 mg daily Plan is for 5 years       CHIEF COMPLIANT: Follow-up on tamoxifen therapy  INTERVAL HISTORY: Robin Huynh is a 82 year old with above-mentioned is right breast DCIS is currently on tamoxifen therapy. She is tolerating it extremely well. She did ice any hot flashes or myalgias. Her health has been fairly good. Denies any lumps or nodules in the breasts. Her mammograms done in July 2017 were normal.  REVIEW OF SYSTEMS:   Constitutional: Denies fevers, chills or abnormal weight loss Eyes: Denies blurriness of vision Ears, nose, mouth, throat, and face: Denies mucositis or sore throat Respiratory: Denies cough, dyspnea or wheezes Cardiovascular: Denies palpitation, chest discomfort Gastrointestinal:  Denies nausea, heartburn or change in bowel habits Skin: Denies abnormal skin rashes Lymphatics: Denies new lymphadenopathy or easy bruising Neurological:Denies numbness, tingling or new weaknesses Behavioral/Psych: Mood is stable, no new changes  Extremities: No lower extremity edema Breast:  denies any pain or lumps or nodules in either breasts All other systems were reviewed with the patient and are negative.  I have reviewed the past medical history, past surgical history, social history and family  history with the patient and they are unchanged from previous note.  ALLERGIES:  has No Known Allergies.  MEDICATIONS:  Current Outpatient Prescriptions  Medication Sig Dispense Refill  . aspirin 81 MG tablet Take 81 mg by mouth daily.    Marland Kitchen CALCIUM PO Take 1 tablet by mouth daily.    . Cholecalciferol (VITAMIN D) 2000 UNITS tablet Take 2,000 Units by mouth 3 (three) times daily.    . fish oil-omega-3 fatty acids 1000 MG capsule Take 2 g by mouth daily.    . fluticasone (FLONASE) 50 MCG/ACT nasal spray Place 2 sprays into both nostrils daily. 16 g 0  . levothyroxine (SYNTHROID, LEVOTHROID) 50 MCG tablet TAKE ONE TABLET BY MOUTH ONCE DAILY 90 tablet 1  . magnesium 30 MG tablet Take 30 mg by mouth 2 (two) times daily.    . Multiple Vitamin (MULTIVITAMIN) tablet Take 1 tablet by mouth daily.    . Red Yeast Rice 600 MG CAPS Take 600 mg by mouth daily.     . tamoxifen (NOLVADEX) 20 MG tablet TAKE ONE TABLET BY MOUTH ONCE DAILY 90 tablet 3  . vitamin C (ASCORBIC ACID) 500 MG tablet Take 500 mg by mouth daily.     No current facility-administered medications for this visit.     PHYSICAL EXAMINATION: ECOG PERFORMANCE STATUS: 0 - Asymptomatic  Vitals:   02/22/16 1030  BP: (!) 143/53  Pulse: 60  Resp: 18  Temp: 98.1 F (36.7 C)   Filed Weights   02/22/16 1030  Weight: 156 lb 4.8 oz (70.9 kg)    GENERAL:alert, no distress and comfortable SKIN: skin color, texture, turgor are normal,  no rashes or significant lesions EYES: normal, Conjunctiva are pink and non-injected, sclera clear OROPHARYNX:no exudate, no erythema and lips, buccal mucosa, and tongue normal  NECK: supple, thyroid normal size, non-tender, without nodularity LYMPH:  no palpable lymphadenopathy in the cervical, axillary or inguinal LUNGS: clear to auscultation and percussion with normal breathing effort HEART: regular rate & rhythm and no murmurs and no lower extremity edema ABDOMEN:abdomen soft, non-tender and normal  bowel sounds MUSCULOSKELETAL:no cyanosis of digits and no clubbing  NEURO: alert & oriented x 3 with fluent speech, no focal motor/sensory deficits EXTREMITIES: No lower extremity edema BREAST: No palpable masses or nodules . No palpable axillary supraclavicular or infraclavicular adenopathy no breast tenderness or nipple discharge. (exam performed in the presence of a chaperone)  LABORATORY DATA:  I have reviewed the data as listed   Chemistry      Component Value Date/Time   NA 137 12/13/2015 1054   NA 140 02/22/2015 1018   K 4.3 12/13/2015 1054   K 4.2 02/22/2015 1018   CL 102 12/13/2015 1054   CL 106 11/26/2011 1129   CO2 24 12/13/2015 1054   CO2 26 02/22/2015 1018   BUN 18 12/13/2015 1054   BUN 19.8 02/22/2015 1018   CREATININE 0.99 (H) 12/13/2015 1054   CREATININE 0.9 02/22/2015 1018      Component Value Date/Time   CALCIUM 9.2 12/13/2015 1054   CALCIUM 9.1 02/22/2015 1018   ALKPHOS 46 12/13/2015 1054   ALKPHOS 62 02/22/2015 1018   AST 30 12/13/2015 1054   AST 23 02/22/2015 1018   ALT 8 12/13/2015 1054   ALT <9 02/22/2015 1018   BILITOT 0.4 12/13/2015 1054   BILITOT 0.31 02/22/2015 1018       Lab Results  Component Value Date   WBC 6.4 12/13/2015   HGB 12.4 12/13/2015   HCT 38.7 12/13/2015   MCV 85.8 12/13/2015   PLT 165 12/13/2015   NEUTROABS 3,520 12/13/2015    ASSESSMENT & PLAN:  Primary cancer of lower outer quadrant of right female breast Right breast DCIS intermediate grade ER/PR positive status post mastectomy currently on hormonal therapy with tamoxifen 20 mg daily since 2013  Tamoxifen toxicities: No side effects of tamoxifen Patient will complete 5 years of therapy by Sept 2018.  Breast cancer surveillance: 1. Mammograms done 07/14/2015 were without any evidence of malignancy. Breast density category C 2. Breasts exam done 02/22/2016 is without any lumps or nodules  Previous Blood work revealed mild thrombocytopenia with a platelet count  of 137. It is of no clinical significance.  Return to clinic in 1 year for follow-up With survivorship.  I spent 25 minutes talking to the patient of which more than half was spent in counseling and coordination of care.  No orders of the defined types were placed in this encounter.  The patient has a good understanding of the overall plan. she agrees with it. she will call with any problems that may develop before the next visit here.   Rulon Eisenmenger, MD 02/22/16

## 2016-03-05 IMAGING — US US SOFT TISSUE HEAD/NECK
1 series · 14 of 25 positions shown · non-contrast
Comparison: None available

CLINICAL DATA: Thyromegaly

EXAM:
THYROID ULTRASOUND
TECHNIQUE: Ultrasound examination of the thyroid gland and adjacent soft
tissues was performed.

[Series 1: us soft tissue head/neck · 0.07mm/px · 14 of 69 slices shown]
[im 1/69]
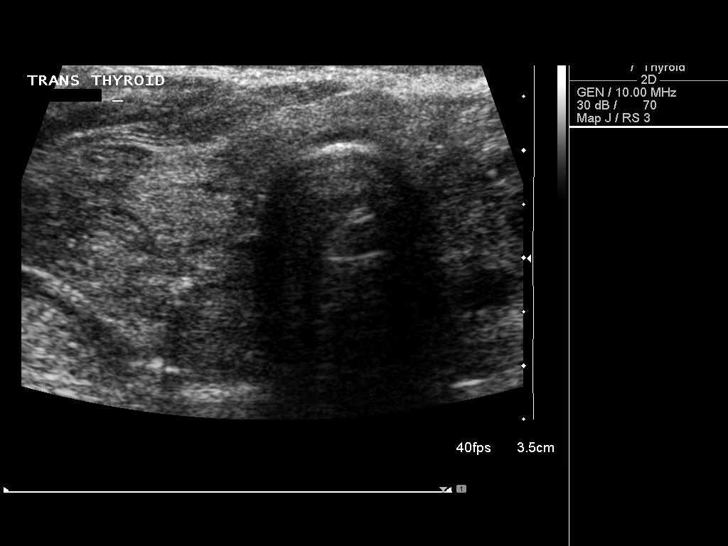
[im 6/69]
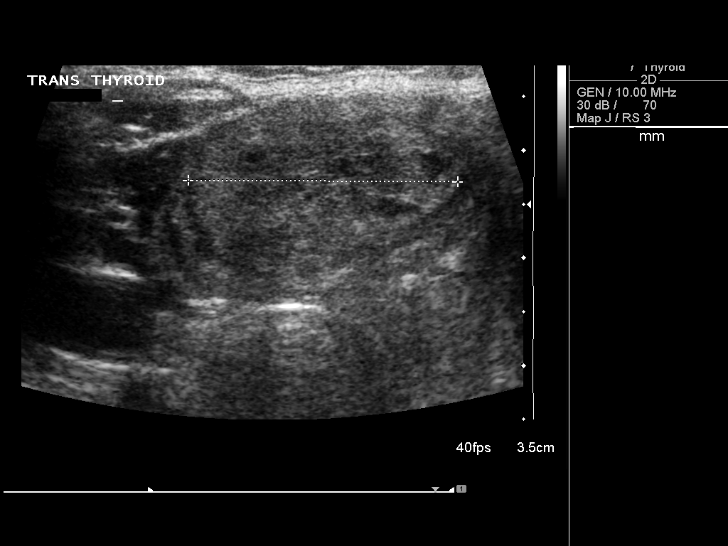
[im 12/69]
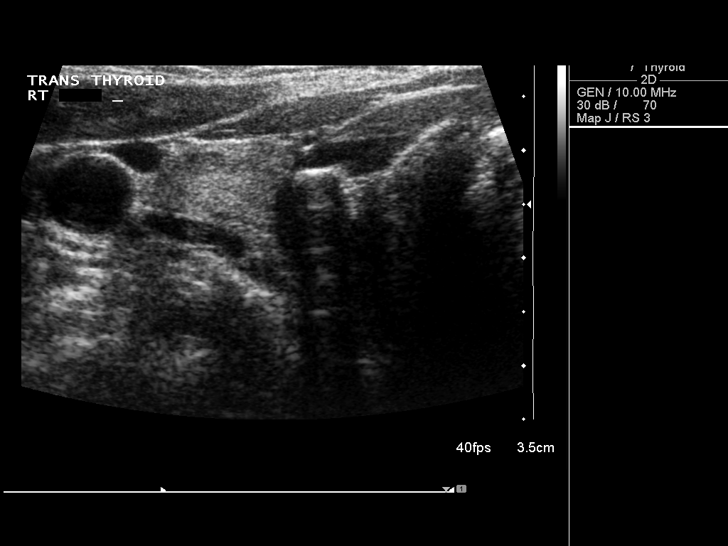
[im 18/69]
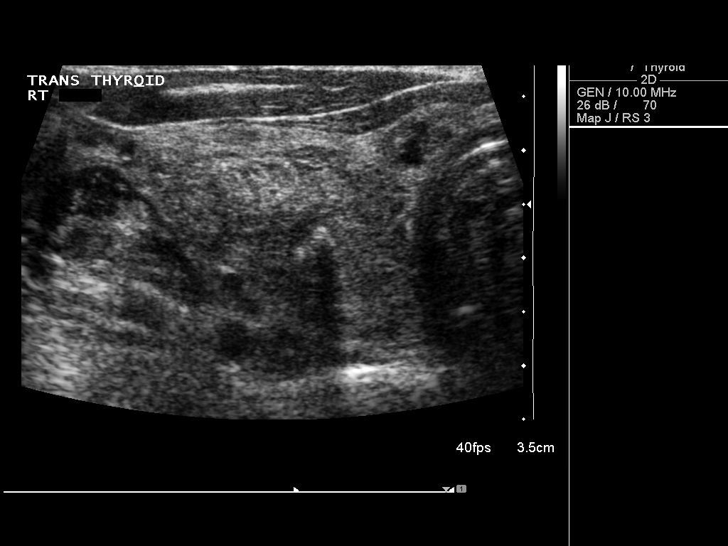
[im 23/69]
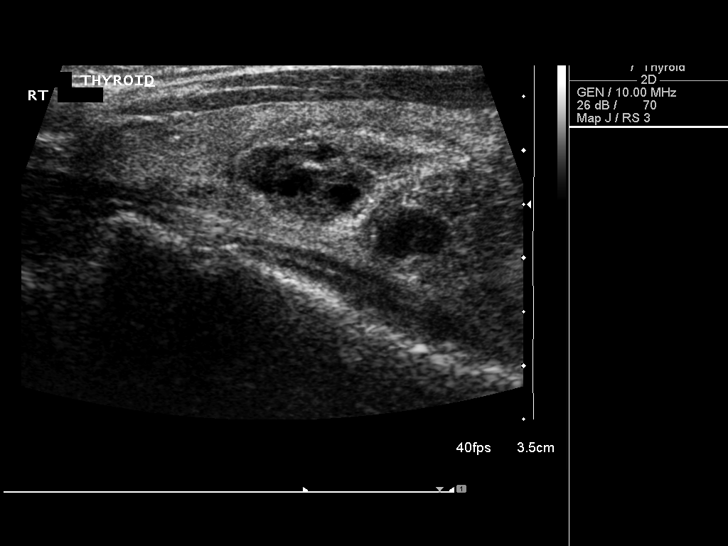
[im 26/69]
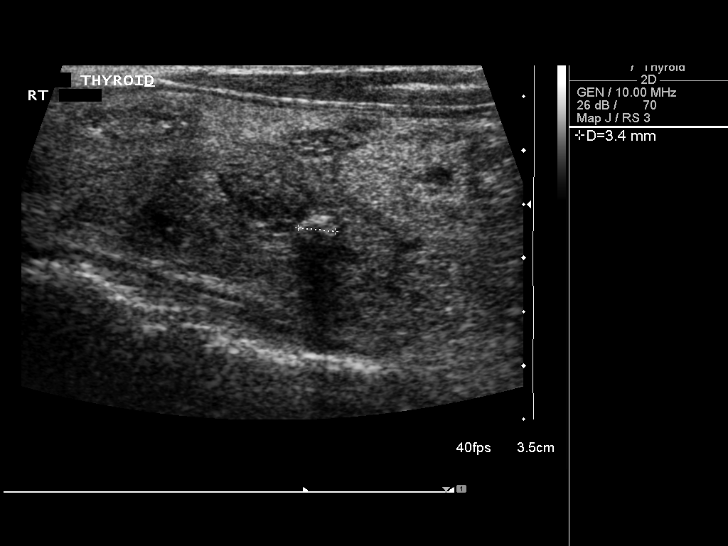
[im 32/69]
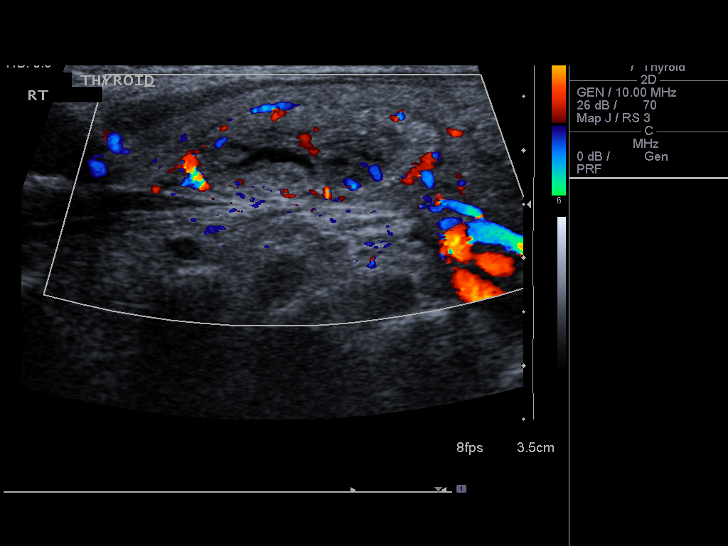
[im 37/69]
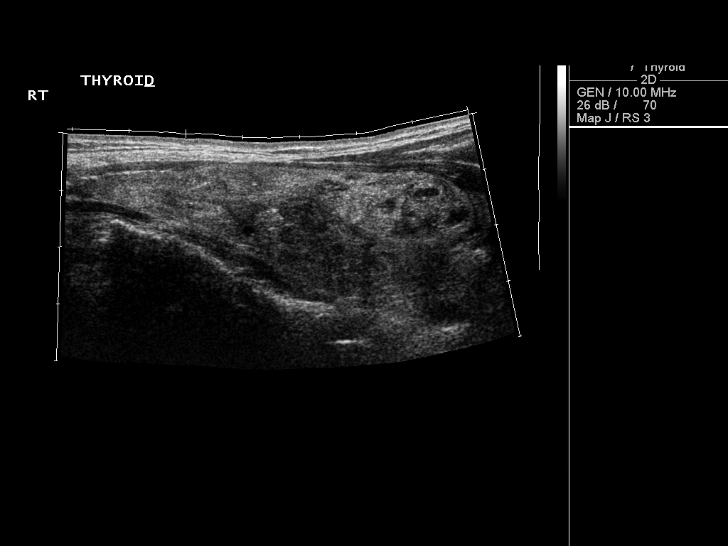
[im 43/69]
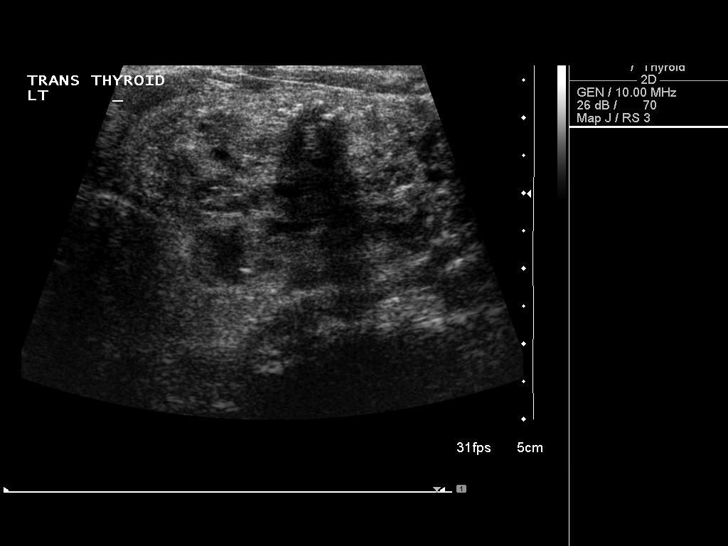
[im 46/69]
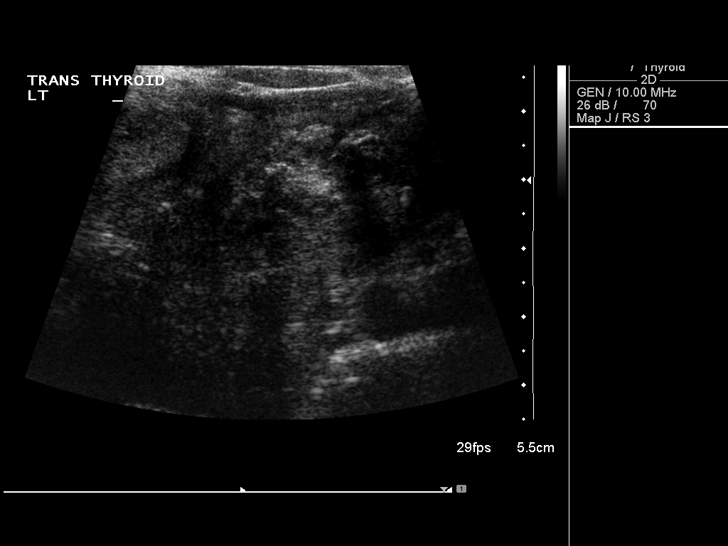
[im 52/69]
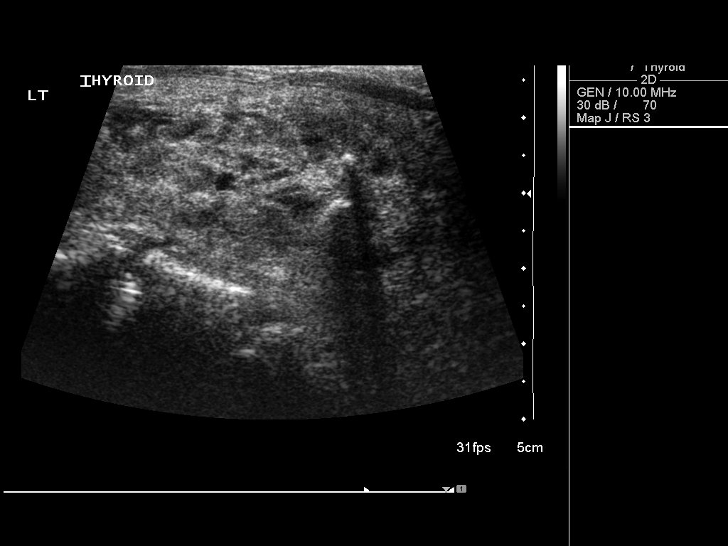
[im 57/69]
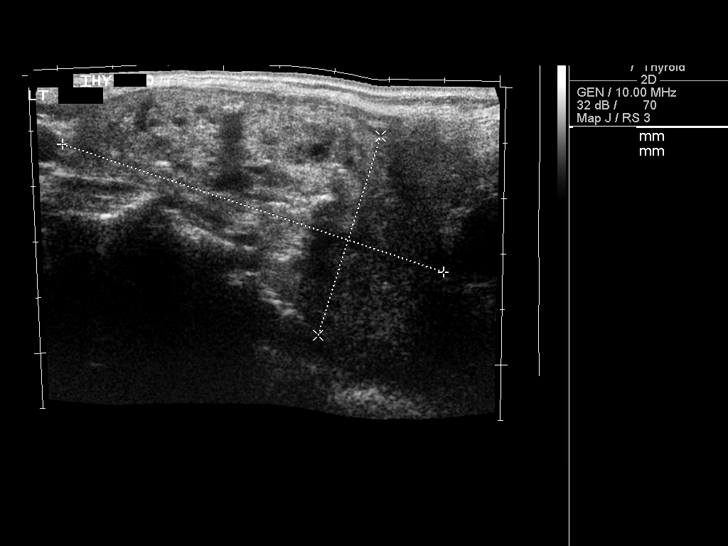
[im 63/69]
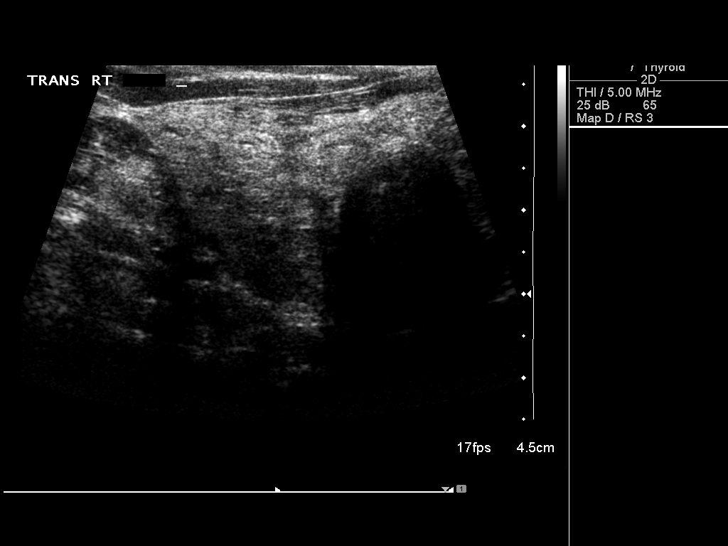
[im 69/69]
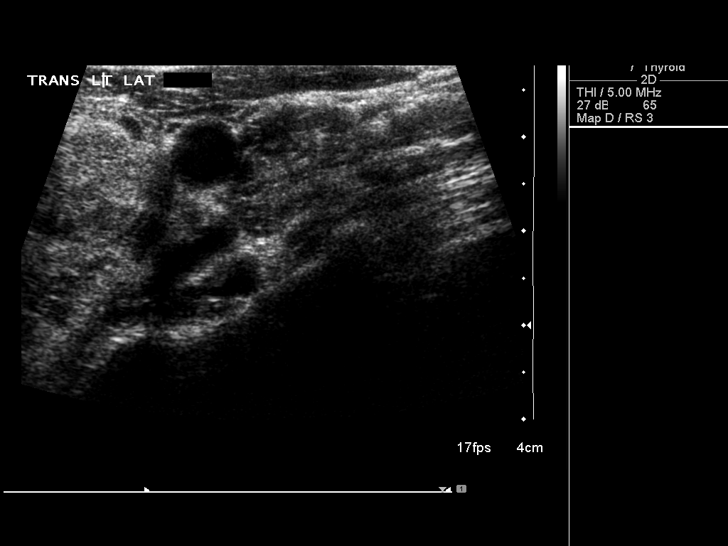

[14 of 25 positions shown; findings below may reference images not displayed]

FINDINGS: Right thyroid lobe

Measurements: 7.0 x 2.3 x 2.8 cm. Markedly heterogeneous gland.
There are 2 scattered calcifications in the gland Complex upper pole
predominately solid nodule measures 24 x 9 x 18 mm.

Left thyroid lobe

Measurements: 7.2 x 3.8 x 4.1 cm. Heterogeneous tissue without
nodule.

Isthmus

Thickness: 1.5 cm. Heterogeneous tissue. Complex predominately solid
35 x 16 x 25 mm nodule.

Lymphadenopathy

None visualized.
IMPRESSION: Heterogeneous gland.

Right and isthmic nodules meet fine-needle aspiration criteria for
biopsy. Findings meet consensus criteria for biopsy.
Ultrasound-guided fine needle aspiration should be considered, as
per the consensus statement: Management of Thyroid Nodules Detected
at US: Society of Radiologists in Ultrasound Consensus Conference

## 2016-03-13 IMAGING — US US THYROID BIOPSY
1 series · 13 of 25 positions shown · non-contrast
Comparison: Ultrasound done 02/02/2015

INDICATION: Right thyroid nodule measuring 2.4 x 0.9 x 1.8 cms. Isthmus nodule
measuring 3.5 x 1.6 x 2.5 cms. Request for ultrasound guided fine
needle aspirate biopsy.

EXAM:
ULTRASOUND GUIDED NEEDLE ASPIRATE BIOPSY OF THE THYROID GLAND x2
MEDICATIONS:
1% Lidocaine.
ANESTHESIA/SEDATION:
No sedation medication given.

[Series 1: us thyroid biopsy · 0.07mm/px · 25 acquisitions, 13 frames shown]
[im 1/25]
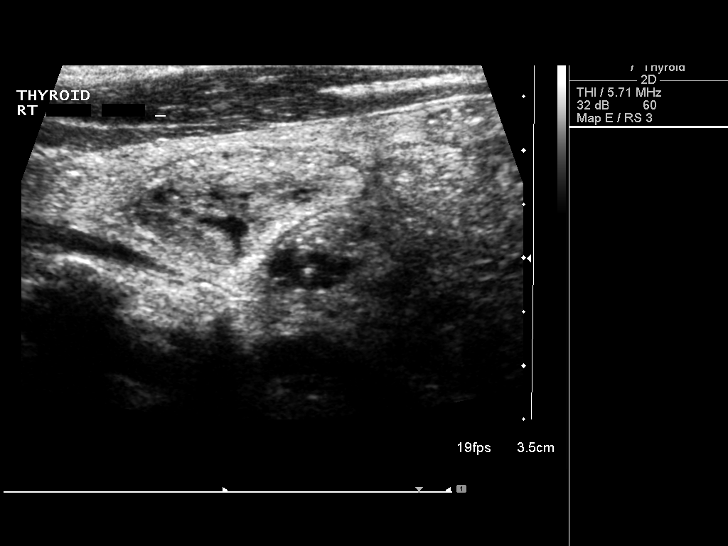
[im 3/25]
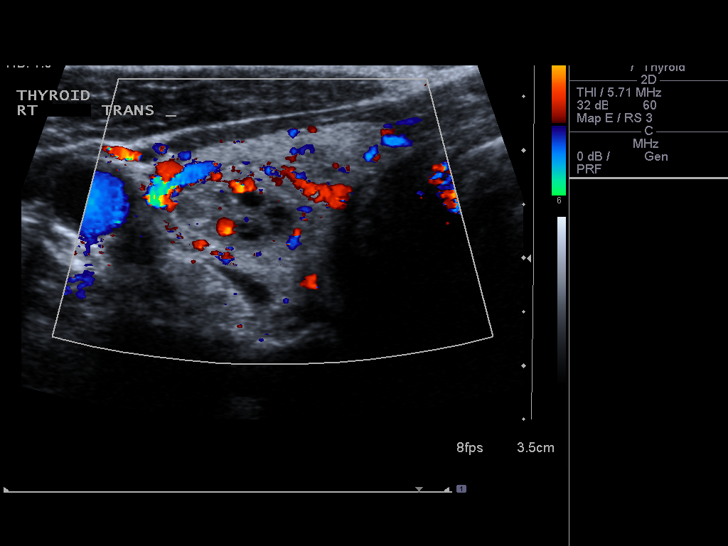
[im 5/25]
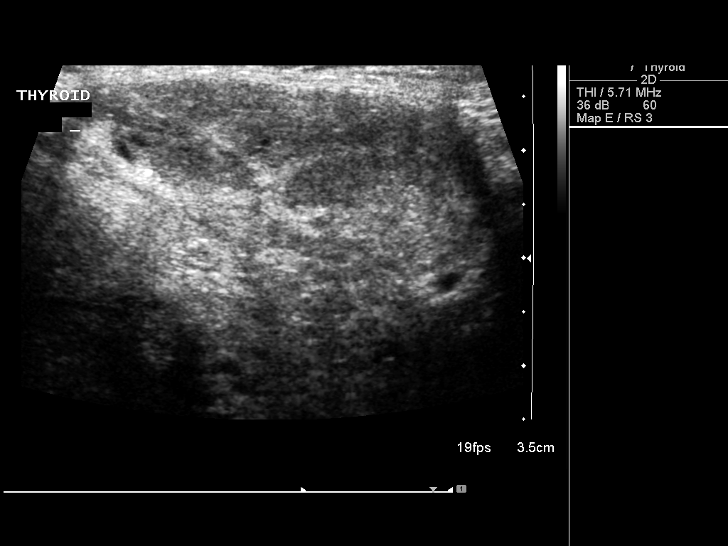
[im 7/25]
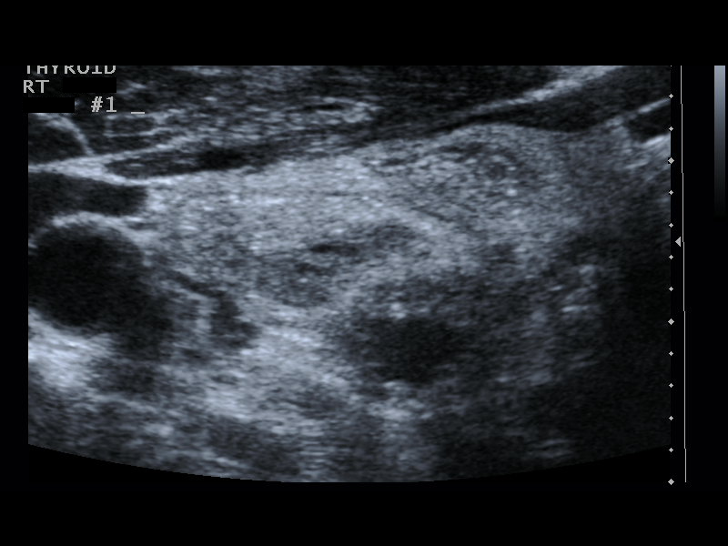
[im 9/25]
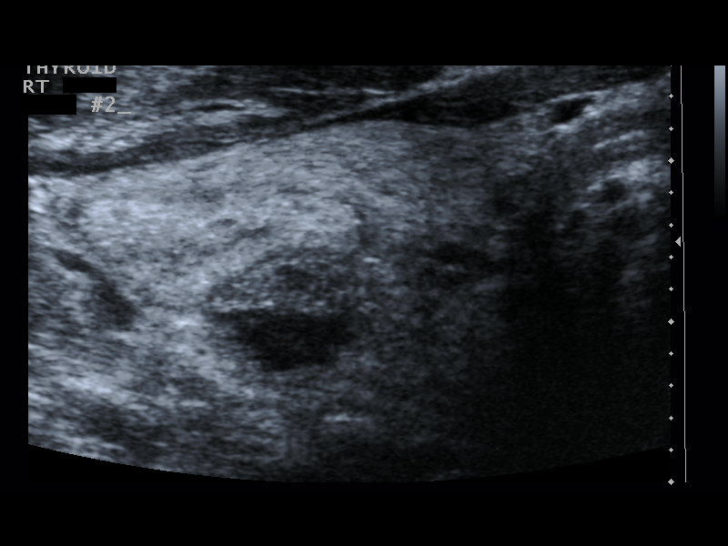
[im 11/25]
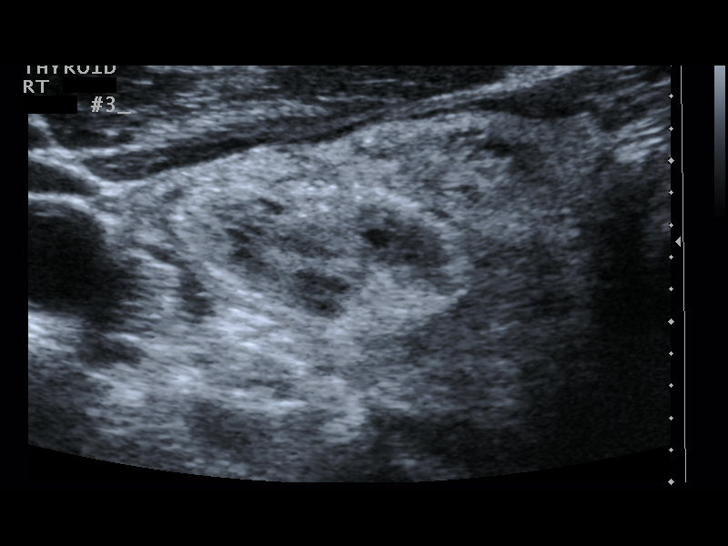
[im 13/25]
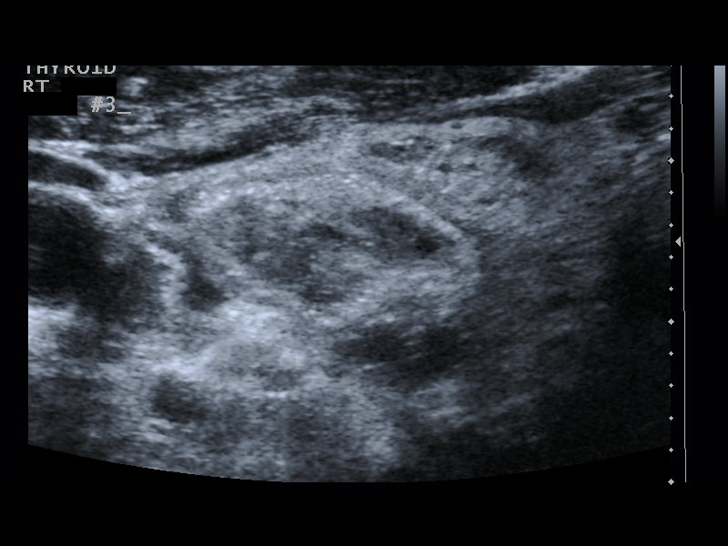
[im 15/25]
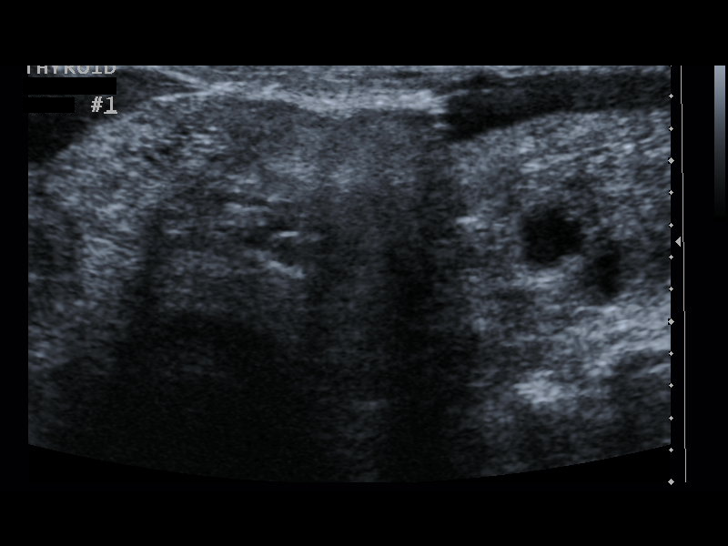
[im 17/25]
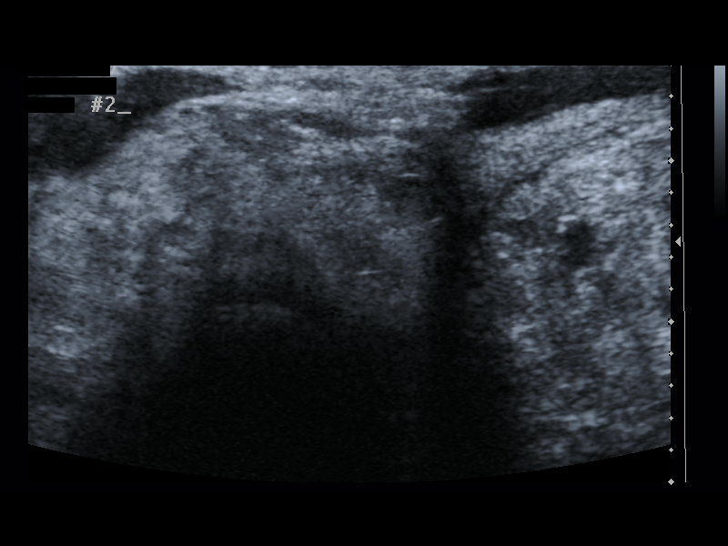
[im 19/25]
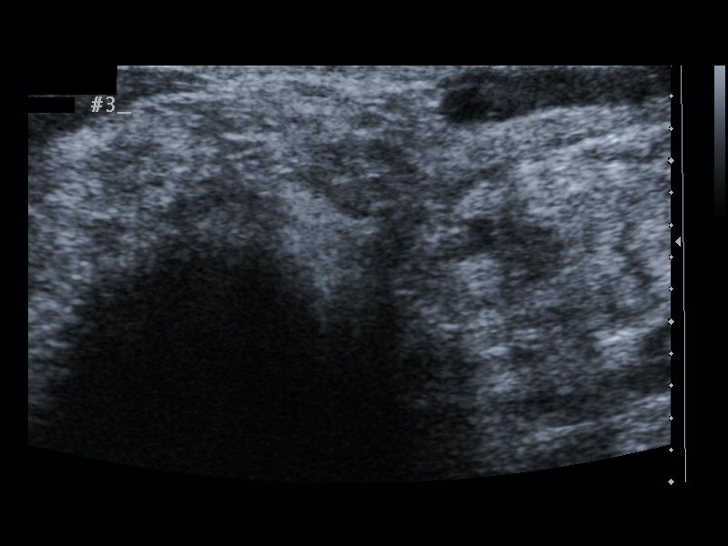
[im 21/25]
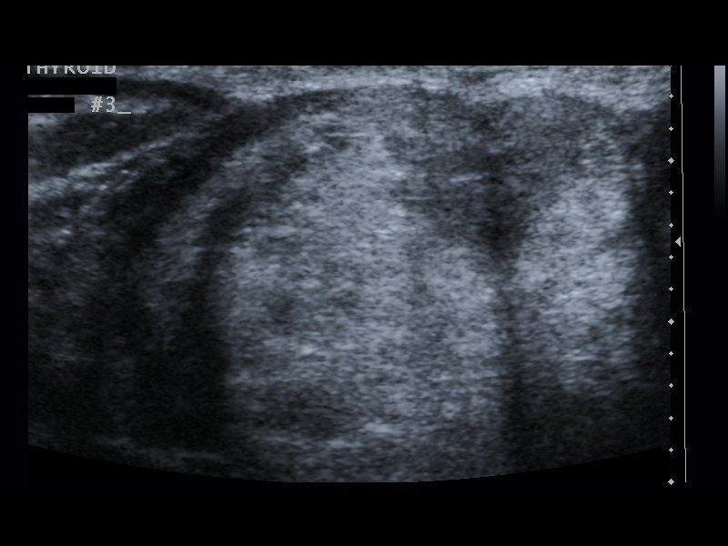
[im 23/25]
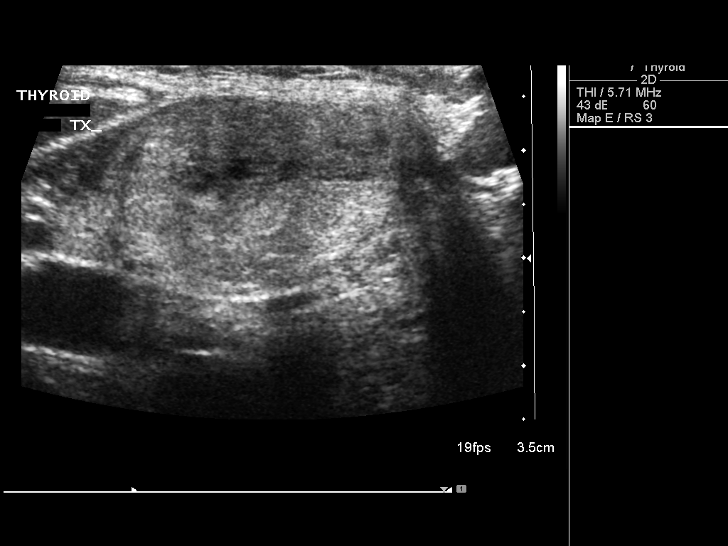
[im 25/25]
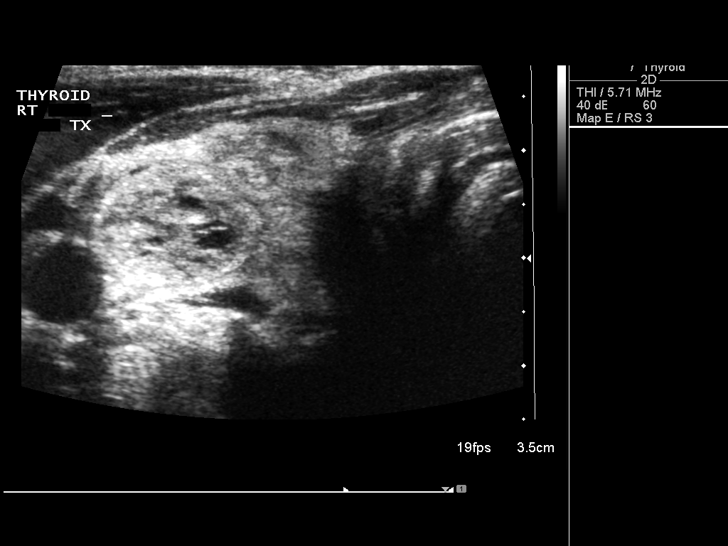

[13 of 25 positions shown; findings below may reference images not displayed]

COMPLICATIONS:
None immediate.

PROCEDURE:
Thyroid biopsy was thoroughly discussed with the patient and
questions were answered. The benefits, risks, alternatives, and
complications were also discussed. The patient understands and
wishes to proceed with the procedure. Written consent was obtained.

Ultrasound was performed to localize and mark an adequate site for
the biopsy. The patient was then prepped and draped in a normal
sterile fashion. Local anesthesia was provided with 1% lidocaine.
Using direct ultrasound guidance, 3 passes were made using needles
into the nodule within the right lobe and the isthmus of the
thyroid. Ultrasound was used to confirm needle placements on all
occasions. Specimens were sent to Pathology for analysis.
IMPRESSION: Ultrasound guided needle aspirate biopsy performed of the right and
isthmus thyroid nodules.

## 2016-03-16 ENCOUNTER — Encounter: Payer: Self-pay | Admitting: Physician Assistant

## 2016-03-16 ENCOUNTER — Ambulatory Visit (INDEPENDENT_AMBULATORY_CARE_PROVIDER_SITE_OTHER): Payer: Medicare Other | Admitting: Physician Assistant

## 2016-03-16 VITALS — BP 132/76 | Temp 98.1°F | Resp 14 | Ht 60.0 in | Wt 158.2 lb

## 2016-03-16 DIAGNOSIS — Z0001 Encounter for general adult medical examination with abnormal findings: Secondary | ICD-10-CM

## 2016-03-16 DIAGNOSIS — E559 Vitamin D deficiency, unspecified: Secondary | ICD-10-CM

## 2016-03-16 DIAGNOSIS — Z6830 Body mass index (BMI) 30.0-30.9, adult: Secondary | ICD-10-CM | POA: Diagnosis not present

## 2016-03-16 DIAGNOSIS — E6609 Other obesity due to excess calories: Secondary | ICD-10-CM

## 2016-03-16 DIAGNOSIS — Z0000 Encounter for general adult medical examination without abnormal findings: Secondary | ICD-10-CM

## 2016-03-16 DIAGNOSIS — R6889 Other general symptoms and signs: Secondary | ICD-10-CM

## 2016-03-16 DIAGNOSIS — C50511 Malignant neoplasm of lower-outer quadrant of right female breast: Secondary | ICD-10-CM | POA: Diagnosis not present

## 2016-03-16 DIAGNOSIS — E785 Hyperlipidemia, unspecified: Secondary | ICD-10-CM | POA: Diagnosis not present

## 2016-03-16 DIAGNOSIS — R0989 Other specified symptoms and signs involving the circulatory and respiratory systems: Secondary | ICD-10-CM

## 2016-03-16 DIAGNOSIS — Z79899 Other long term (current) drug therapy: Secondary | ICD-10-CM | POA: Diagnosis not present

## 2016-03-16 DIAGNOSIS — R7303 Prediabetes: Secondary | ICD-10-CM

## 2016-03-16 DIAGNOSIS — E039 Hypothyroidism, unspecified: Secondary | ICD-10-CM

## 2016-03-16 DIAGNOSIS — I10 Essential (primary) hypertension: Secondary | ICD-10-CM | POA: Diagnosis not present

## 2016-03-16 DIAGNOSIS — E782 Mixed hyperlipidemia: Secondary | ICD-10-CM | POA: Diagnosis not present

## 2016-03-16 DIAGNOSIS — K5790 Diverticulosis of intestine, part unspecified, without perforation or abscess without bleeding: Secondary | ICD-10-CM | POA: Diagnosis not present

## 2016-03-16 LAB — CBC WITH DIFFERENTIAL/PLATELET
BASOS ABS: 0 {cells}/uL (ref 0–200)
Basophils Relative: 0 %
EOS ABS: 174 {cells}/uL (ref 15–500)
Eosinophils Relative: 3 %
HEMATOCRIT: 38.3 % (ref 35.0–45.0)
Hemoglobin: 12.5 g/dL (ref 11.7–15.5)
Lymphocytes Relative: 36 %
Lymphs Abs: 2088 cells/uL (ref 850–3900)
MCH: 27.5 pg (ref 27.0–33.0)
MCHC: 32.6 g/dL (ref 32.0–36.0)
MCV: 84.4 fL (ref 80.0–100.0)
MONOS PCT: 11 %
MPV: 11.8 fL (ref 7.5–12.5)
Monocytes Absolute: 638 cells/uL (ref 200–950)
NEUTROS ABS: 2900 {cells}/uL (ref 1500–7800)
Neutrophils Relative %: 50 %
PLATELETS: 140 10*3/uL (ref 140–400)
RBC: 4.54 MIL/uL (ref 3.80–5.10)
RDW: 14.3 % (ref 11.0–15.0)
WBC: 5.8 10*3/uL (ref 3.8–10.8)

## 2016-03-16 LAB — BASIC METABOLIC PANEL WITH GFR
BUN: 19 mg/dL (ref 7–25)
CHLORIDE: 102 mmol/L (ref 98–110)
CO2: 27 mmol/L (ref 20–31)
CREATININE: 0.89 mg/dL (ref 0.60–0.93)
Calcium: 9.4 mg/dL (ref 8.6–10.4)
GFR, Est African American: 71 mL/min (ref >=60)
GFR, Est Non African American: 62 mL/min (ref >=60)
GLUCOSE: 90 mg/dL (ref 65–99)
Potassium: 4.4 mmol/L (ref 3.5–5.3)
Sodium: 139 mmol/L (ref 135–146)

## 2016-03-16 LAB — HEPATIC FUNCTION PANEL
ALT: 9 U/L (ref 6–29)
AST: 29 U/L (ref 10–35)
Albumin: 3.6 g/dL (ref 3.6–5.1)
Alkaline Phosphatase: 55 U/L (ref 33–130)
BILIRUBIN DIRECT: 0.1 mg/dL (ref <=0.2)
Indirect Bilirubin: 0.2 mg/dL (ref 0.2–1.2)
Total Bilirubin: 0.3 mg/dL (ref 0.2–1.2)
Total Protein: 6.9 g/dL (ref 6.1–8.1)

## 2016-03-16 LAB — LIPID PANEL
CHOL/HDL RATIO: 1.7 ratio (ref <5.0)
CHOLESTEROL: 160 mg/dL (ref <200)
HDL: 94 mg/dL (ref >50)
LDL Cholesterol: 54 mg/dL (ref <100)
TRIGLYCERIDES: 58 mg/dL (ref <150)
VLDL: 12 mg/dL (ref <30)

## 2016-03-16 LAB — TSH: TSH: 0.39 m[IU]/L — AB

## 2016-03-16 NOTE — Progress Notes (Signed)
Patient ID: Robin Huynh, female   DOB: 23-Feb-1934, 82 y.o.   MRN: [id number]  MEDICARE ANNUAL WELLNESS VISIT AND FOLLOW UP  Assessment:    Labile hypertension (2000) - continue medications, DASH diet, exercise and monitor at home. Call if greater than 130/80.  - TSH  Hypothyroidism, unspecified hypothyroidism type -Hypothyroidism-check TSH level, continue medications the same, reminded to take on an empty stomach 30-20mins before food.  - TSH   Prediabetes (06/2009) -cont diet and exercise - Hemoglobin A1c   Vitamin D deficiency -cont supplement  Medication management - CBC with Differential/Platelet - BASIC METABOLIC PANEL WITH GFR - Hepatic function panel  Hyperlipidemia -continue medications, check lipids, decrease fatty foods, increase activity.  - Lipid panel   Diverticulosis of intestine without bleeding, unspecified intestinal tract location -increase fiber, follow up  Primary cancer of lower outer quadrant of right female breast ([place]) -on tamoxifen- will stop in sept, has completed 5 years -followed by oncology  Medicare annual wellness visit, subsequent -due next year  Thyromegaly -continue to monitor as needed   Obesity  - long discussion about weight loss, diet, and exercise  Over 30 minutes of exam, counseling, chart review, and critical decision making was performed Future Appointments Date Time Provider [place]  06/22/2016 11:00 AM [name], MD [facility] None  01/14/2017 10:00 AM [name], MD [facility] None  02/21/2017 10:00 AM Nicholas Lose, MD [facility] None     Plan:   During the course of the visit the patient was educated and counseled about appropriate screening and preventive services including:    Pneumococcal vaccine   Influenza vaccine  Td vaccine  Prevnar 13  Screening electrocardiogram  Screening mammography  Bone densitometry screening  Colorectal cancer screening  Diabetes  screening  Glaucoma screening  Nutrition counseling   Advanced directives: given info/requested copies   Subjective:   Robin Huynh is a 82 y.o. female who presents for Medicare Annual Wellness Visit and 3 month follow up on hypertension, prediabetes, hyperlipidemia, vitamin D def.   Her blood pressure has been controlled at home, today their BP is BP: 132/76 She does workout. She denies chest pain, shortness of breath, dizziness.  She is on cholesterol medication and denies myalgias. Her cholesterol is at goal. The cholesterol last visit was:   Lab Results  Component Value Date   CHOL 152 12/13/2015   HDL 88 12/13/2015   LDLCALC 53 12/13/2015   TRIG 54 12/13/2015   CHOLHDL 1.7 12/13/2015   She has been working on diet and exercise for prediabetes, and denies foot ulcerations, hyperglycemia, hypoglycemia , increased appetite, nausea, paresthesia of the feet, polydipsia, polyuria, visual disturbances, vomiting and weight loss. Last A1C in the office was:  Lab Results  Component Value Date   HGBA1C 5.7 (H) 12/13/2015    Lab Results  Component Value Date   GFRAA 63 12/13/2015   Patient is on Vitamin D supplement. Lab Results  Component Value Date   VD25OH 3 12/13/2015     She is on thyroid medication. Her medication was not changed last visit.   Lab Results  Component Value Date   TSH 1.12 12/13/2015   She is still taking tamoxifen but will stop in sept .  She has history of breast cancer.   BMI is Body mass index is 30.9 kg/m., she is working on diet and exercise. Wt Readings from Last 3 Encounters:  03/16/16 158 lb 3.2 oz (71.8 kg)  02/22/16 156 lb 4.8 oz (70.9 kg)  12/13/15 151 lb 6.4 oz (68.7 kg)    Medication Review Current Outpatient Prescriptions on File Prior to Visit  Medication Sig Dispense Refill  . aspirin 81 MG tablet Take 81 mg by mouth daily.    Marland Kitchen CALCIUM PO Take 1 tablet by mouth daily.    . Cholecalciferol (VITAMIN D) 2000 UNITS tablet Take  2,000 Units by mouth 3 (three) times daily.    . fish oil-omega-3 fatty acids 1000 MG capsule Take 2 g by mouth daily.    . fluticasone (FLONASE) 50 MCG/ACT nasal spray Place 2 sprays into both nostrils daily. 16 g 0  . levothyroxine (SYNTHROID, LEVOTHROID) 50 MCG tablet TAKE ONE TABLET BY MOUTH ONCE DAILY 90 tablet 1  . magnesium 30 MG tablet Take 30 mg by mouth 2 (two) times daily.    . Multiple Vitamin (MULTIVITAMIN) tablet Take 1 tablet by mouth daily.    . Red Yeast Rice 600 MG CAPS Take 600 mg by mouth daily.     . tamoxifen (NOLVADEX) 20 MG tablet Take 1 tablet (20 mg total) by mouth daily. 90 tablet 2  . vitamin C (ASCORBIC ACID) 500 MG tablet Take 500 mg by mouth daily.     No current facility-administered medications on file prior to visit.     Current Problems (verified) Patient Active Problem List   Diagnosis Date Noted  . Medicare annual wellness visit, subsequent 10/07/2014  . Hyperlipidemia 07/15/2013  . Medication management 06/30/2013  . Labile hypertension (2000)   . Hypothyroidism   . Prediabetes (06/2009)   . Vitamin D deficiency   . Diverticulosis   . Primary cancer of lower outer quadrant of right female breast ([place]) 06/21/2011    Screening Tests Immunization History  Administered Date(s) Administered  . Pneumococcal Conjugate-13 03/01/2014  . Pneumococcal Polysaccharide-23 01/09/2007  . Td 05/18/2008    Preventative care: Last colonoscopy: 2014 Last mammogram: 07/2015 DEXA:07/07/14 PAP 2012 US thyroid with bx 02/2015 negative  Prior vaccinations: TD or Tdap: 2010  Influenza: Declined  Pneumococcal: 2009 Prevnar13: 2010 Shingles/Zostavax: 2010  Names of Other Physician/Practitioners you currently use: 1. [facility]- here for primary care 2. [facility], eye doctor, last visit 2017 3. Dr.  Toy Cookey, dentist, last visit [id number] Patient Care Team: [name], MD as PCP - General (Internal  Medicine) Nicholas Lose, MD as Consulting Physician (Hematology and Oncology) Sable Feil, MD as Consulting Physician (Gastroenterology) Coralie Keens, MD as Consulting Physician (General Surgery)  Allergies No Known Allergies  SURGICAL HISTORY She  has a past surgical history that includes Breast surgery; Colonoscopy; Mastectomy complete / simple w/ sentinel node biopsy (08/10/11); and Mastectomy w/ sentinel node biopsy (08/10/2011). FAMILY HISTORY Her family history includes Colon cancer (age of onset: 89) in her mother; Kidney cancer in her brother; Lung cancer in her brother and sister; Prostate cancer in her father. SOCIAL HISTORY She  reports that she has never smoked. She has never used smokeless tobacco. She reports that she does not drink alcohol or use drugs.   MEDICARE WELLNESS OBJECTIVES: Physical activity: Current Exercise Habits: Home exercise routine, Type of exercise: strength training/weights;walking ([employer] 3 x a week), Time (Minutes): 30, Frequency (Times/Week): 4, Weekly Exercise (Minutes/Week): 120, Intensity: Mild Cardiac risk factors: Cardiac Risk Factors include: advanced age (>65men, >72 women);dyslipidemia;hypertension;obesity (BMI >30kg/m2) Depression/mood screen:   Depression screen [facility] 2/9 03/16/2016  Decreased Interest 0  Down, Depressed, Hopeless 0  PHQ - 2 Score 0  ADLs:  In your present state of health, do you have any difficulty performing the following activities: 03/16/2016 12/13/2015  Hearing? N N  Vision? N N  Difficulty concentrating or making decisions? N N  Walking or climbing stairs? N N  Dressing or bathing? N N  Doing errands, shopping? N N  Some recent data might be hidden     Cognitive Testing  Alert? Yes  Normal Appearance?Yes  Oriented to person? Yes  Place? Yes   Time? Yes  Recall of three objects?  Yes  Can perform simple calculations? Yes  Displays appropriate judgment?Yes  Can read the correct time from a watch  face?Yes  EOL planning: Does Patient Have a Medical Advance Directive?: Yes Type of Advance Directive: Healthcare Power of Attorney, Living will Copy of [place] in Chart?: No - copy requested   Objective:   Today's Vitals   03/16/16 0959  BP: 132/76  Resp: 14  Temp: 98.1 F (36.7 C)  Weight: 158 lb 3.2 oz (71.8 kg)  Height: 5' (1.524 m)  PainSc: 0-No pain   Body mass index is 30.9 kg/m.  General appearance: alert, no distress, WD/WN,  female HEENT: normocephalic, sclerae anicteric, TMs pearly, nares patent, no discharge or erythema, pharynx normal Oral cavity: MMM, no lesions Neck: supple, no lymphadenopathy, thyromegaly which is soft and non-tender to palpation, no masses Heart: RRR, normal S1, S2, no murmurs Lungs: CTA bilaterally, no wheezes, rhonchi, or rales Abdomen: +bs, soft, non tender, non distended, no masses, no hepatomegaly, no splenomegaly Musculoskeletal: nontender, no swelling, no obvious deformity Extremities: no edema, no cyanosis, no clubbing Pulses: 2+ symmetric, upper and lower extremities, normal cap refill Neurological: alert, oriented x 3, CN2-12 intact, strength normal upper extremities and lower extremities, sensation normal throughout, DTRs 2+ throughout, no cerebellar signs, gait normal Psychiatric: normal affect, behavior normal, pleasant  Breast: defer Gyn: defer Rectal: defer   Medicare Attestation I have personally reviewed: The patient's medical and social history Their use of alcohol, tobacco or illicit drugs Their current medications and supplements The patient's functional ability including ADLs,fall risks, home safety risks, cognitive, and hearing and visual impairment Diet and physical activities Evidence for depression or mood disorders  The patient's weight, height, BMI, and visual acuity have been recorded in the chart.  I have made referrals, counseling, and provided education to the patient based on review  of the above and I have provided the patient with a written personalized care plan for preventive services.     Vicie Mutters, PA-C   03/16/2016

## 2016-03-16 NOTE — Patient Instructions (Addendum)
Tumeric with black pepper extract is a great natural antiinflammatory that helps with arthritis and aches and pain. Can get from [employer] or any health food store. Need to take at least 800mg  twice a day with food.   Minimum is 64 oz, 80 oz of water a day or up to 100oz for weight loss.  Eat 3 meals a day, need protein with breakfast.      Bad carbs also include fruit juice, alcohol, and sweet tea. These are empty calories that do not signal to your brain that you are full.   Please remember the good carbs are still carbs which convert into sugar. So please measure them out no more than 1/2-1 cup of rice, oatmeal, pasta, and beans  Veggies are however free foods! Pile them on.   Not all fruit is created equal. Please see the list below, the fruit at the bottom is higher in sugars than the fruit at the top. Please avoid all dried fruits.        Simple math prevails.    1st - exercise does not produce significant weight loss - at best one converts fat into muscle , "bulks up", loses inches, but usually stays "weight neutral"     2nd - think of your body weightas a check book: If you eat more calories than you burn up - you save money or gain weight .... Or if you spend more money than you put in the check book, ie burn up more calories than you eat, then you lose weight     3rd - if you walk or run 1 mile, you burn up 100 calories - you have to burn up 3,500 calories to lose 1 pound, ie you have to walk/run 35 miles to lose 1 measly pound. So if you want to lose 10 #, then you have to walk/run 350 miles, so.... clearly exercise is not the solution.     4. So if you consume 1,500 calories, then you have to burn up the equivalent of 15 miles to stay weight neutral - It also stands to reason that if you consume 1,500 cal/day and don't lose weight, then you must be burning up about 1,500 cals/day to stay weight neutral.     5. If you really want to lose weight, you must cut your calorie intake  300 calories /day and at that rate you should lose about 1 # every 3 days.   6. Please purchase Dr Fara Olden Fuhrman's book(s) "The End of Dieting" & "Eat to Live" . It has some great concepts and recipes.     We want weight loss that will last so you should lose 1-2 pounds a week.  THAT IS IT! Please pick THREE things a month to change. Once it is a habit check off the item. Then pick another three items off the list to become habits.  If you are already doing a habit on the list GREAT!  Cross that item off! o Don't drink your calories. Ie, alcohol, soda, fruit juice, and sweet tea.  o Drink more water. Drink a glass when you feel hungry or before each meal.  o Eat breakfast - Complex carb and protein (likeDannon light and fit yogurt, oatmeal, fruit, eggs, [place] bacon). o Measure your cereal.  Eat no more than one cup a day. (ie [place]) o Eat an apple a day. o Add a vegetable a day. o Try a new vegetable a month. o Use Pam! Stop using oil  or butter to cook. o Don't finish your plate or use smaller plates. o Share your dessert. o Eat sugar free Jello for dessert or frozen grapes. o Don't eat 2-3 hours before bed. o Switch to whole wheat bread, pasta, and brown rice. o Make healthier choices when you eat out. No fries! o Pick baked chicken, NOT fried. o Don't forget to SLOW DOWN when you eat. It is not going anywhere.  o Take the stairs. o Park far away in the parking lot o [employer] (or weights) for 10 minutes while watching TV. o Walk at work for 10 minutes during break. o Walk outside 1 time a week with your friend, kids, dog, or significant other. o Start a walking group at [place] the mall as much as you can tolerate.  o Keep a food diary. o Weigh yourself daily. o Walk for 15 minutes 3 days per week. o Cook at home more often and eat out less.  If life happens and you go back to old habits, it is okay.  Just start over. You can do it!   If you experience chest pain,  get short of breath, or tired during the exercise, please stop immediately and inform your doctor.

## 2016-03-17 LAB — HEMOGLOBIN A1C
Hgb A1c MFr Bld: 5.7 % — ABNORMAL HIGH (ref <5.7)
MEAN PLASMA GLUCOSE: 117 mg/dL

## 2016-06-22 ENCOUNTER — Ambulatory Visit (INDEPENDENT_AMBULATORY_CARE_PROVIDER_SITE_OTHER): Payer: Medicare Other | Admitting: Internal Medicine

## 2016-06-22 VITALS — BP 110/68 | HR 60 | Temp 97.3°F | Resp 16 | Ht 60.0 in | Wt 153.4 lb

## 2016-06-22 DIAGNOSIS — R0989 Other specified symptoms and signs involving the circulatory and respiratory systems: Secondary | ICD-10-CM | POA: Diagnosis not present

## 2016-06-22 DIAGNOSIS — E782 Mixed hyperlipidemia: Secondary | ICD-10-CM

## 2016-06-22 DIAGNOSIS — E559 Vitamin D deficiency, unspecified: Secondary | ICD-10-CM

## 2016-06-22 DIAGNOSIS — Z79899 Other long term (current) drug therapy: Secondary | ICD-10-CM | POA: Diagnosis not present

## 2016-06-22 DIAGNOSIS — R7303 Prediabetes: Secondary | ICD-10-CM | POA: Diagnosis not present

## 2016-06-22 DIAGNOSIS — E039 Hypothyroidism, unspecified: Secondary | ICD-10-CM

## 2016-06-22 LAB — BASIC METABOLIC PANEL WITH GFR
BUN: 15 mg/dL (ref 7–25)
CALCIUM: 9.5 mg/dL (ref 8.6–10.4)
CO2: 28 mmol/L (ref 20–31)
Chloride: 102 mmol/L (ref 98–110)
Creat: 0.97 mg/dL — ABNORMAL HIGH (ref 0.60–0.88)
GFR, EST AFRICAN AMERICAN: 64 mL/min (ref >=60)
GFR, EST NON AFRICAN AMERICAN: 55 mL/min — AB (ref >=60)
GLUCOSE: 92 mg/dL (ref 65–99)
POTASSIUM: 4.4 mmol/L (ref 3.5–5.3)
Sodium: 138 mmol/L (ref 135–146)

## 2016-06-22 LAB — CBC WITH DIFFERENTIAL/PLATELET
BASOS PCT: 0 %
Basophils Absolute: 0 cells/uL (ref 0–200)
EOS PCT: 3 %
Eosinophils Absolute: 189 cells/uL (ref 15–500)
HEMATOCRIT: 38.1 % (ref 35.0–45.0)
Hemoglobin: 12.2 g/dL (ref 11.7–15.5)
LYMPHS PCT: 42 %
Lymphs Abs: 2646 cells/uL (ref 850–3900)
MCH: 27.1 pg (ref 27.0–33.0)
MCHC: 32 g/dL (ref 32.0–36.0)
MCV: 84.5 fL (ref 80.0–100.0)
MONO ABS: 693 {cells}/uL (ref 200–950)
MPV: 10.9 fL (ref 7.5–12.5)
Monocytes Relative: 11 %
NEUTROS PCT: 44 %
Neutro Abs: 2772 cells/uL (ref 1500–7800)
PLATELETS: 180 10*3/uL (ref 140–400)
RBC: 4.51 MIL/uL (ref 3.80–5.10)
RDW: 14.8 % (ref 11.0–15.0)
WBC: 6.3 10*3/uL (ref 3.8–10.8)

## 2016-06-22 LAB — LIPID PANEL
CHOLESTEROL: 171 mg/dL (ref <200)
HDL: 84 mg/dL (ref >50)
LDL Cholesterol: 71 mg/dL (ref <100)
Total CHOL/HDL Ratio: 2 Ratio (ref <5.0)
Triglycerides: 78 mg/dL (ref <150)
VLDL: 16 mg/dL (ref <30)

## 2016-06-22 LAB — TSH: TSH: 1.51 mIU/L

## 2016-06-22 LAB — HEPATIC FUNCTION PANEL
ALBUMIN: 3.7 g/dL (ref 3.6–5.1)
ALT: 6 U/L (ref 6–29)
AST: 22 U/L (ref 10–35)
Alkaline Phosphatase: 49 U/L (ref 33–130)
BILIRUBIN INDIRECT: 0.2 mg/dL (ref 0.2–1.2)
Bilirubin, Direct: 0.1 mg/dL (ref <=0.2)
TOTAL PROTEIN: 6.7 g/dL (ref 6.1–8.1)
Total Bilirubin: 0.3 mg/dL (ref 0.2–1.2)

## 2016-06-22 MED ORDER — PREDNISONE 10 MG PO TABS: ORAL_TABLET | ORAL | 1 refills | Status: AC

## 2016-06-22 NOTE — Patient Instructions (Signed)

## 2016-06-22 NOTE — Progress Notes (Signed)
This very nice 82 y.o.  MBF presents for 6 month follow up with Hypertension, Hyperlipidemia, Pre-Diabetes and Vitamin D Deficiency.  2013, she underwent R Mastectomy  for  Rt breast DCIS (ER+) and is on Tamoxifen  and  followed by Dr Lindi Adie.      Patient is treated for HTN (2000) & BP has been controlled at home. Today's BP is at goal - 110/68. Patient has had no complaints of any cardiac type chest pain, palpitations, dyspnea/orthopnea/PND, dizziness, claudication, or dependent edema.     Hyperlipidemia is controlled with diet & meds. Patient denies myalgias or other med SE's. Last Lipids were at goal: Lab Results  Component Value Date   CHOL 160 03/16/2016   HDL 94 03/16/2016   LDLCALC 54 03/16/2016   TRIG 58 03/16/2016   CHOLHDL 1.7 03/16/2016      Also, the patient has history of PreDiabetes  (A12c 6.1q% in 2011 and 6.4% in 2012) and she has had no symptoms of reactive hypoglycemia, diabetic polys, paresthesias or visual blurring.  Last A1c was  Lab Results  Component Value Date   HGBA1C 5.7 (H) 03/16/2016      In 2011, she was dx'd with a goiter and Hypothyroidism and begun on Thyroid Replacement.   Further, the patient also has history of Vitamin D Deficiency and supplements vitamin D without any suspected side-effects. Last vitamin D was at goal:   Lab Results  Component Value Date   VD25OH 88 12/13/2015   Current Outpatient Prescriptions on File Prior to Visit  Medication Sig  . aspirin 81 MG  Take 81 mg by mouth daily.  Marland Kitchen CALCIUM  Take 1 tablet by mouth daily.  Marland Kitchen VITAMIN D 2000 UNITS  Take 2,000 Units by mouth 3 (three) times daily.  . fish oil-omega-3  1000 MG  Take 2 g by mouth daily.  Marland Kitchen FLONASE  nasal spray Place 2 sprays into both nostrils daily.  Marland Kitchen levothyroxine 50 MCG  TAKE 1 TAB  DAILY - takes 1/2 tab  . magnesium 30 MG  Take 30 mg by mouth 2 (two) times daily.  . Multiple Vitamin Take 1 tablet by mouth daily.  . Red Yeast Rice 600 MG  Take 600 mg by mouth  daily.   . tamoxifen 20 MG  Take 1 tablet (20 mg total) by mouth daily.  . vitamin C  500 MG  Take 500 mg by mouth daily.   No Known Allergies   PMHx:   Past Medical History:  Diagnosis Date  . Breast cancer ([place]) 08/2011   right  . Diverticulosis   . Hypothyroidism   . Labile hypertension   . Prediabetes   . Thoracic aorta atherosclerosis ([place])    via CXR  . Vitamin D deficiency    Immunization History  Administered Date(s) Administered  . Pneumococcal Conjugate-13 03/01/2014  . Pneumococcal Polysaccharide-23 01/09/2007  . Td 05/18/2008   Past Surgical History:  Procedure Laterality Date  . BREAST SURGERY     right breast  . COLONOSCOPY    . MASTECTOMY COMPLETE / SIMPLE W/ SENTINEL NODE BIOPSY  08/10/11   right  . MASTECTOMY W/ SENTINEL NODE BIOPSY  08/10/2011   Procedure: MASTECTOMY WITH SENTINEL LYMPH NODE BIOPSY;  Surgeon: Harl Bowie, MD;  Location: [place];  Service: General;  Laterality: Right;   FHx:    Reviewed / unchanged  SHx:    Reviewed / unchanged  Systems Review:  Constitutional: Denies fever, chills,  wt changes, headaches, insomnia, fatigue, night sweats, change in appetite. Eyes: Denies redness, blurred vision, diplopia, discharge, itchy, watery eyes.  ENT: Denies discharge, congestion, post nasal drip, epistaxis, sore throat, earache, hearing loss, dental pain, tinnitus, vertigo, sinus pain, snoring.  CV: Denies chest pain, palpitations, irregular heartbeat, syncope, dyspnea, diaphoresis, orthopnea, PND, claudication or edema. Respiratory: denies cough, dyspnea, DOE, pleurisy, hoarseness, laryngitis, wheezing.  Gastrointestinal: Denies dysphagia, odynophagia, heartburn, reflux, water brash, abdominal pain or cramps, nausea, vomiting, bloating, diarrhea, constipation, hematemesis, melena, hematochezia  or hemorrhoids. Genitourinary: Denies dysuria, frequency, urgency, nocturia, hesitancy, discharge, hematuria or flank pain. Musculoskeletal: Denies  arthralgias, myalgias, stiffness, jt. swelling, pain, limping or strain/sprain.  Skin: Denies pruritus, rash, hives, warts, acne, eczema or change in skin lesion(s). Neuro: No weakness, tremor, incoordination, spasms, paresthesia or pain. Psychiatric: Denies confusion, memory loss or sensory loss. Endo: Denies change in weight, skin or hair change.  Heme/Lymph: No excessive bleeding, bruising or enlarged lymph nodes.  Physical Exam  BP 110/68   Pulse 60   Temp 97.3 F (36.3 C)   Resp 16   Ht 5' (1.524 m)   Wt 153 lb 6.4 oz (69.6 kg)   BMI 29.96 kg/m   Appears well nourished, well groomed  and in no distress.  Eyes: PERRLA, EOMs, conjunctiva no swelling or erythema. Sinuses: No frontal/maxillary tenderness ENT/Mouth: EAC's clear, TM's nl w/o erythema, bulging. Nares clear w/o erythema, swelling, exudates. Oropharynx clear without erythema or exudates. Oral hygiene is good. Tongue normal, non obstructing. Hearing intact.  Neck: Supple. Thyroid nl. Car 2+/2+ without bruits, nodes or JVD. Chest: Respirations nl with BS clear & equal w/o rales, rhonchi, wheezing or stridor.  Cor: Heart sounds normal w/ regular rate and rhythm without sig. murmurs, gallops, clicks or rubs. Peripheral pulses normal and equal  without edema.  Abdomen: Soft & bowel sounds normal. Non-tender w/o guarding, rebound, hernias, masses or organomegaly.  Lymphatics: Unremarkable.  Musculoskeletal: Full ROM all peripheral extremities, joint stability, 5/5 strength and normal gait.  Skin: Warm, dry without exposed rashes, lesions or ecchymosis apparent.  Neuro: Cranial nerves intact, reflexes equal bilaterally. Sensory-motor testing grossly intact. Tendon reflexes grossly intact.  Pysch: Alert & oriented x 3.  Insight and judgement nl & appropriate. No ideations.  Assessment and Plan:  1. Labile hypertension (2000)  - Continue medication, monitor blood pressure at home.  - Continue DASH diet. Reminder to go to  the ER if any CP,  SOB, nausea, dizziness, severe HA, changes vision/speech,  left arm numbness and tingling and jaw pain.  - CBC with Differential/Platelet - BASIC METABOLIC PANEL WITH GFR - Magnesium - TSH  2. Hyperlipidemia, mixed  - Continue diet/meds, exercise,& lifestyle modifications.  - Continue monitor periodic cholesterol/liver & renal functions   - Hepatic function panel - Lipid panel - TSH  3. Prediabetes (06/2009)  - Hemoglobin A1c - Insulin, random  4. Vitamin D deficiency  - Continue diet, exercise, lifestyle modifications.  - Monitor appropriate labs. - Continue supplementatio - VITAMIN D 25 Hydroxy   5. Hypothyroidism  - TSH  6. Medication management  - CBC with Differential/Platelet - BASIC METABOLIC PANEL WITH GFR - Hepatic function panel - Magnesium - Lipid panel - TSH - Hemoglobin A1c - Insulin, random - VITAMIN D 25 Hydroxy        Discussed  regular exercise, BP monitoring, weight control to achieve/maintain BMI less than 25 and discussed med and SE's. Recommended labs to assess and monitor clinical status with further disposition pending  results of labs. Over 30 minutes of exam, counseling, chart review was performed.

## 2016-06-23 ENCOUNTER — Encounter: Payer: Self-pay | Admitting: Internal Medicine

## 2016-06-23 LAB — HEMOGLOBIN A1C
Hgb A1c MFr Bld: 5.9 % — ABNORMAL HIGH (ref <5.7)
Mean Plasma Glucose: 123 mg/dL

## 2016-06-23 LAB — VITAMIN D 25 HYDROXY (VIT D DEFICIENCY, FRACTURES): VIT D 25 HYDROXY: 93 ng/mL (ref 30–100)

## 2016-06-23 LAB — MAGNESIUM: Magnesium: 1.8 mg/dL (ref 1.5–2.5)

## 2016-06-25 LAB — INSULIN, RANDOM: Insulin: 4.1 u[IU]/mL (ref 2.0–19.6)

## 2016-07-30 ENCOUNTER — Encounter: Payer: Self-pay | Admitting: Internal Medicine

## 2016-08-31 ENCOUNTER — Other Ambulatory Visit: Payer: Self-pay | Admitting: Internal Medicine

## 2016-10-10 NOTE — Progress Notes (Signed)
Assessment and Plan:   Labile hypertension (2000) - continue medications, DASH diet, exercise and monitor at home. Call if greater than 130/80.   Hypothyroidism, unspecified type Hypothyroidism-check TSH level, continue medications the same, reminded to take on an empty stomach 30-73mins before food.   Prediabetes (06/2009) Continue diet management  Hyperlipidemia -continue medications, check lipids, decrease fatty foods, increase activity.    Continue diet and meds as discussed. Further disposition pending results of labs. WILL GET LABS AT CPE Over 30 minutes of exam, counseling, chart review, and critical decision making was performed Future Appointments Date Time Provider [place]  01/14/2017 10:00 AM [name], MD [facility] None  02/21/2017 10:00 AM Nicholas Lose, MD [facility] None    HPI 82 y.o. female  presents for 3 month follow up on hypertension, cholesterol, prediabetes, and vitamin D deficiency.   Her blood pressure has been controlled at home, today their BP is BP: 110/70  She does workout, goes to the [employer] 3 x a week and walks in between. She denies chest pain, shortness of breath, dizziness. Should get off tamoxifen in Feb.   She is not on cholesterol medication and denies myalgias. Her cholesterol is at goal. The cholesterol last visit was:   Lab Results  Component Value Date   CHOL 171 06/22/2016   HDL 84 06/22/2016   LDLCALC 71 06/22/2016   TRIG 78 06/22/2016   CHOLHDL 2.0 06/22/2016    She has been working on diet and exercise for prediabetes but last visit she was in the DM range, and denies paresthesia of the feet, polydipsia, polyuria and visual disturbances. Last A1C in the office was:  Lab Results  Component Value Date   HGBA1C 5.9 (H) 06/22/2016   BMI is Body mass index is 29.96 kg/m., she is working on diet and exercise. Wt Readings from Last 3 Encounters:  10/11/16 153 lb 6.4 oz (69.6 kg)  06/22/16 153 lb 6.4 oz (69.6 kg)   03/16/16 158 lb 3.2 oz (71.8 kg)   Patient is on Vitamin D supplement.   Lab Results  Component Value Date   VD25OH 93 06/22/2016     She is on thyroid medication. Her medication was not changed last visit.   Lab Results  Component Value Date   TSH 1.51 06/22/2016     Current Medications:  Current Outpatient Prescriptions on File Prior to Visit  Medication Sig Dispense Refill  . aspirin 81 MG tablet Take 81 mg by mouth daily.    Marland Kitchen CALCIUM PO Take 1 tablet by mouth daily.    . Cholecalciferol (VITAMIN D) 2000 UNITS tablet Take 2,000 Units by mouth 3 (three) times daily.    . fish oil-omega-3 fatty acids 1000 MG capsule Take 2 g by mouth daily.    . fluticasone (FLONASE) 50 MCG/ACT nasal spray Place 2 sprays into both nostrils daily. 16 g 0  . levothyroxine (SYNTHROID, LEVOTHROID) 50 MCG tablet TAKE ONE TABLET BY MOUTH ONCE DAILY 90 tablet 1  . magnesium 30 MG tablet Take 30 mg by mouth 2 (two) times daily.    . Multiple Vitamin (MULTIVITAMIN) tablet Take 1 tablet by mouth daily.    . Red Yeast Rice 600 MG CAPS Take 600 mg by mouth daily.     . tamoxifen (NOLVADEX) 20 MG tablet Take 1 tablet (20 mg total) by mouth daily. 90 tablet 2  . vitamin C (ASCORBIC ACID) 500 MG tablet Take 500 mg by mouth daily.     No current  facility-administered medications on file prior to visit.    Medical History:  Past Medical History:  Diagnosis Date  . Breast cancer ([place]) 08/2011   right  . Diverticulosis   . Hypothyroidism   . Labile hypertension   . Prediabetes   . Thoracic aorta atherosclerosis ([place])    via CXR  . Vitamin D deficiency    Allergies: No Known Allergies   Review of Systems:  Review of Systems  Constitutional: Negative.   HENT: Negative.   Eyes: Negative.   Respiratory: Negative.  Negative for shortness of breath.   Cardiovascular: Negative.  Negative for chest pain.  Gastrointestinal: Negative.   Genitourinary: Negative.   Musculoskeletal: Negative.  Negative for  falls.  Skin: Negative.   Neurological: Negative.   Endo/Heme/Allergies: Negative.   Psychiatric/Behavioral: Negative.  Negative for depression.    Family history- Review and unchanged Social history- Review and unchanged Physical Exam: BP 110/70   Pulse 65   Temp (!) 97.3 F (36.3 C)   Resp 14   Ht 5' (1.524 m)   Wt 153 lb 6.4 oz (69.6 kg)   SpO2 95%   BMI 29.96 kg/m  Wt Readings from Last 3 Encounters:  10/11/16 153 lb 6.4 oz (69.6 kg)  06/22/16 153 lb 6.4 oz (69.6 kg)  03/16/16 158 lb 3.2 oz (71.8 kg)   General Appearance: Well nourished, in no apparent distress. Eyes: PERRLA, EOMs, conjunctiva no swelling or erythema Sinuses: No Frontal/maxillary tenderness ENT/Mouth: Ext aud canals clear, TMs without erythema, bulging. No erythema, swelling, or exudate on post pharynx.  Tonsils not swollen or erythematous. Hearing normal.  Neck: Supple, thyroid goiter unchanged  Respiratory: Respiratory effort normal, BS equal bilaterally without rales, rhonchi, wheezing or stridor.  Cardio: RRR with no MRGs. Brisk peripheral pulses without edema.  Abdomen: Soft, + BS,  Non tender, no guarding, rebound, hernias, masses. Lymphatics: Non tender without lymphadenopathy.  Musculoskeletal: Full ROM, 5/5 strength, Normal gait Skin: Warm, dry without rashes, lesions, ecchymosis.  Neuro: Cranial nerves intact. Normal muscle tone, no cerebellar symptoms. Psych: Awake and oriented X 3, normal affect, Insight and Judgment appropriate.    Vicie Mutters, PA-C 9:46 AM [facility]

## 2016-10-11 ENCOUNTER — Ambulatory Visit (INDEPENDENT_AMBULATORY_CARE_PROVIDER_SITE_OTHER): Payer: Medicare Other | Admitting: Physician Assistant

## 2016-10-11 ENCOUNTER — Encounter: Payer: Self-pay | Admitting: Physician Assistant

## 2016-10-11 VITALS — BP 110/70 | HR 65 | Temp 97.3°F | Resp 14 | Ht 60.0 in | Wt 153.4 lb

## 2016-10-11 DIAGNOSIS — R0989 Other specified symptoms and signs involving the circulatory and respiratory systems: Secondary | ICD-10-CM | POA: Diagnosis not present

## 2016-10-11 DIAGNOSIS — R7303 Prediabetes: Secondary | ICD-10-CM | POA: Diagnosis not present

## 2016-10-11 DIAGNOSIS — Z79899 Other long term (current) drug therapy: Secondary | ICD-10-CM

## 2016-10-11 DIAGNOSIS — E039 Hypothyroidism, unspecified: Secondary | ICD-10-CM | POA: Diagnosis not present

## 2016-10-11 DIAGNOSIS — E782 Mixed hyperlipidemia: Secondary | ICD-10-CM | POA: Diagnosis not present

## 2016-10-11 NOTE — Patient Instructions (Addendum)
HOW TO TREAT VIRAL COUGH AND COLD SYMPTOMS:  -Symptoms usually last at least 1 week with the worst symptoms being around day 4.  - colds usually start with a sore throat and end with a cough, and the cough can take 2 weeks to get better.  -No antibiotics are needed for colds, flu, sore throats, cough, bronchitis UNLESS symptoms are longer than 7 days OR if you are getting better then get drastically worse.  -There are a lot of combination medications (Dayquil, Nyquil, Vicks 44, tyelnol cold and sinus, ETC). Please look at the ingredients on the back so that you are treating the correct symptoms and not doubling up on medications/ingredients.    Medicines you can use  Nasal congestion  - pseudoephedrine (Sudafed)- behind the counter, do not use if you have high blood pressure, medicine that have -D in them.  - phenylephrine (Sudafed PE) -Dextormethorphan + chlorpheniramine (Coridcidin HBP)- okay if you have high blood pressure -Oxymetazoline (Afrin) nasal spray- LIMIT to 3 days -Saline nasal spray -Neti pot (used distilled or bottled water)  Ear pain/congestion  -pseudoephedrine (sudafed) - Nasonex/flonase nasal spray  Fever  -Acetaminophen (Tyelnol) -Ibuprofen (Advil, motrin, aleve)  Sore Throat  -Acetaminophen (Tyelnol) -Ibuprofen (Advil, motrin, aleve) -Drink a lot of water -Gargle with salt water - Rest your voice (don't talk) -Throat sprays -Cough drops  Body Aches  -Acetaminophen (Tyelnol) -Ibuprofen (Advil, motrin, aleve)  Headache  -Acetaminophen (Tyelnol) -Ibuprofen (Advil, motrin, aleve) - Exedrin, Exedrin Migraine  Allergy symptoms (cough, sneeze, runny nose, itchy eyes) -Claritin or loratadine cheapest but likely the weakest  -Zyrtec or certizine at night because it can make you sleepy -The strongest is allegra or fexafinadine  Cheapest at [employer], [employer], [employer]  Cough  -Dextromethorphan (Delsym)- medicine that has DM in it -Guafenesin  (Mucinex/Robitussin) - cough drops - drink lots of water  Chest Congestion  -Guafenesin (Mucinex/Robitussin)  Red Itchy Eyes  - Naphcon-A  Upset Stomach  - Bland diet (nothing spicy, greasy, fried, and high acid foods like tomatoes, oranges, berries) -OKAY- cereal, bread, soup, crackers, rice -Eat smaller more frequent meals -reduce caffeine, no alcohol -Loperamide (Imodium-AD) if diarrhea -Prevacid for heart burn  General health when sick  -Hydration -wash your hands frequently -keep surfaces clean -change pillow cases and sheets often -Get fresh air but do not exercise strenuously -Vitamin D, double up on it - Vitamin C -Zinc         Bad carbs also include fruit juice, alcohol, and sweet tea. These are empty calories that do not signal to your brain that you are full.   Please remember the good carbs are still carbs which convert into sugar. So please measure them out no more than 1/2-1 cup of rice, oatmeal, pasta, and beans  Veggies are however free foods! Pile them on.   Not all fruit is created equal. Please see the list below, the fruit at the bottom is higher in sugars than the fruit at the top. Please avoid all dried fruits.      Simple math prevails.    1st - exercise does not produce significant weight loss - at best one converts fat into muscle , "bulks up", loses inches, but usually stays "weight neutral"     2nd - think of your body weightas a check book: If you eat more calories than you burn up - you save money or gain weight .... Or if you spend more money than you put in the check book, ie burn up  more calories than you eat, then you lose weight     3rd - if you walk or run 1 mile, you burn up 100 calories - you have to burn up 3,500 calories to lose 1 pound, ie you have to walk/run 35 miles to lose 1 measly pound. So if you want to lose 10 #, then you have to walk/run 350 miles, so.... clearly exercise is not the solution.     4. So if you  consume 1,500 calories, then you have to burn up the equivalent of 15 miles to stay weight neutral - It also stands to reason that if you consume 1,500 cal/day and don't lose weight, then you must be burning up about 1,500 cals/day to stay weight neutral.     5. If you really want to lose weight, you must cut your calorie intake 300 calories /day and at that rate you should lose about 1 # every 3 days.   6. Please purchase Dr Fara Olden Fuhrman's book(s) "The End of Dieting" & "Eat to Live" . It has some great concepts and recipes.

## 2017-01-13 NOTE — Progress Notes (Signed)
[facility] [name], M.D.     Uvaldo Bristle. Silverio Lay, P.A.-C Liane Comber, [place] [address] [place], [place] [address] Telephone [phone] Telefax [phone] Annual Screening/Preventative Visit & Comprehensive Evaluation &  Examination     This very nice 83 y.o. MBF presents for a Screening/Preventative Visit & comprehensive evaluation and management of multiple medical co-morbidities.  Patient has been followed for HTN, Prediabetes, Hyperlipidemia and Vitamin D Deficiency. She has hx/o R Mastectomy for Rt Breast DCIS (ER+) and is followed by Dr Lindi Adie on Tamoxifen.      Patient has been followed expectantly since 2000 for labile HTN. Patient's BP has been controlled at home and patient denies any cardiac symptoms as chest pain, palpitations, shortness of breath, dizziness or ankle swelling. Today's BP is at goal - 128/78.      Patient's hyperlipidemia is controlled with diet and medications. Patient denies myalgias or other medication SE's. Last lipids were at goal:  Lab Results  Component Value Date   CHOL 171 06/22/2016   HDL 84 06/22/2016   LDLCALC 71 06/22/2016   TRIG 78 06/22/2016   CHOLHDL 2.0 06/22/2016      Patient has prediabetes predating (A1c 6.1%/2011 and 6.4%/2012) and patient denies reactive hypoglycemic symptoms, visual blurring, diabetic polys, or paresthesias. Last A1c was not at goal: Lab Results  Component Value Date   HGBA1C 5.9 (H) 06/22/2016      Patient was dx'd w/ a Hypothyroid Goiter in 2011 and begun on replacement therapy.  Finally, patient has history of Vitamin D Deficiency and last Vitamin D was at goal: Lab Results  Component Value Date   VD25OH 93 06/22/2016   Current Outpatient Medications on File Prior to Visit  Medication Sig  . aspirin 81 MG  Take 81 mg by mouth daily.  Marland Kitchen CALCIUM  Take 1 tablet by mouth daily.  Marland Kitchen VIT D 2000 UNITS Take 2,000 Units by  mouth 3 (three) times daily.  . fish oil-omega-3 1000 MG Take 2 g by mouth daily.  Marland Kitchen FLONASEnasal spray Place 2 sprays into both nostrils daily.  . Levothyroxine 50 MCG TAKE ONE TABLET BY MOUTH ONCE DAILY  . magnesium 30 MG  Take 30 mg by mouth 2 (two) times daily.  . Multiple Vitamin  Take 1 tablet by mouth daily.  . Red Yeast Rice 600 MG Take 600 mg by mouth daily.   . tamoxifen 20 MG Take 1 tablet (20 mg total) by mouth daily.  . vitamin C  500 MG  Take 500 mg by mouth daily.   No Known Allergies   Past Medical History:  Diagnosis Date  . Breast cancer ([place]) 08/2011   right  . Diverticulosis   . Hypothyroidism   . Labile hypertension   . Prediabetes   . Thoracic aorta atherosclerosis ([place])    via CXR  . Vitamin D deficiency    Health Maintenance  Topic Date Due  . INFLUENZA VACCINE  06/20/2017 (Originally 08/08/2016)  . MAMMOGRAM  07/19/2017  . TETANUS/TDAP  05/19/2018  . DEXA SCAN  Completed  . PNA vac Low Risk Adult  Completed   Immunization History  Administered Date(s) Administered  . Pneumococcal Conjugate-13 03/01/2014  . Pneumococcal Polysaccharide-23 01/09/2007  . Td 05/18/2008   Last Colon - 08/18/2012 - benign polyp -Dr Sharlett Iles  Last Pap - 07/06/2010 - Negative  Past Surgical History:  Procedure Laterality Date  . BREAST SURGERY     right  breast  . COLONOSCOPY    . MASTECTOMY COMPLETE / SIMPLE W/ SENTINEL NODE BIOPSY  08/10/11   right  . MASTECTOMY W/ SENTINEL NODE BIOPSY  08/10/2011   Procedure: MASTECTOMY WITH SENTINEL LYMPH NODE BIOPSY;  Surgeon: Harl Bowie, MD;  Location: [facility];  Service: General;  Laterality: Right;   Family History  Problem Relation Age of Onset  . Colon cancer Mother 3  . Prostate cancer Father   . Lung cancer Brother   . Lung cancer Sister   . Kidney cancer Brother    Social History   Tobacco Use  . Smoking status: Never Smoker  . Smokeless tobacco: Never Used  Substance Use Topics  . Alcohol use: No  . Drug  use: No    ROS Constitutional: Denies fever, chills, weight loss/gain, headaches, insomnia,  night sweats, and change in appetite. Does c/o fatigue. Eyes: Denies redness, blurred vision, diplopia, discharge, itchy, watery eyes.  ENT: Denies discharge, congestion, post nasal drip, epistaxis, sore throat, earache, hearing loss, dental pain, Tinnitus, Vertigo, Sinus pain, snoring.  Cardio: Denies chest pain, palpitations, irregular heartbeat, syncope, dyspnea, diaphoresis, orthopnea, PND, claudication, edema Respiratory: denies cough, dyspnea, DOE, pleurisy, hoarseness, laryngitis, wheezing.  Gastrointestinal: Denies dysphagia, heartburn, reflux, water brash, pain, cramps, nausea, vomiting, bloating, diarrhea, constipation, hematemesis, melena, hematochezia, jaundice, hemorrhoids Genitourinary: Denies dysuria, frequency, urgency, nocturia, hesitancy, discharge, hematuria, flank pain Breast: Breast lumps, nipple discharge, bleeding.  Musculoskeletal: Denies arthralgia, myalgia, stiffness, Jt. Swelling, pain, limp, and strain/sprain. Denies falls. Skin: Denies puritis, rash, hives, warts, acne, eczema, changing in skin lesion Neuro: No weakness, tremor, incoordination, spasms, paresthesia, pain Psychiatric: Denies confusion, memory loss, sensory loss. Denies Depression. Endocrine: Denies change in weight, skin, hair change, nocturia, and paresthesia, diabetic polys, visual blurring, hyper / hypo glycemic episodes.  Heme/Lymph: No excessive bleeding, bruising, enlarged lymph nodes.  Physical Exam  BP 128/78   Pulse 64   Temp (!) 97.2 F (36.2 C)   Resp 16   Ht 5' (1.524 m)   Wt 158 lb 9.6 oz (71.9 kg)   BMI 30.97 kg/m   General Appearance: Well nourished, well groomed and in no apparent distress.  Eyes: PERRLA, EOMs, conjunctiva no swelling or erythema, normal fundi and vessels. Sinuses: No frontal/maxillary tenderness ENT/Mouth: EACs patent / TMs  nl. Nares clear without erythema,  swelling, mucoid exudates. Oral hygiene is good. No erythema, swelling, or exudate. Tongue normal, non-obstructing. Tonsils not swollen or erythematous. Hearing normal.  Neck: Supple, thyroid normal. No bruits, nodes or JVD. Respiratory: Respiratory effort normal.  BS equal and clear bilateral without rales, rhonci, wheezing or stridor. Cardio: Heart sounds are normal with regular rate and rhythm and no murmurs, rubs or gallops. Peripheral pulses are normal and equal bilaterally without edema. No aortic or femoral bruits. Chest: symmetric with normal excursions and percussion. Breasts: Symmetric, without lumps, nipple discharge, retractions, or fibrocystic changes.  Abdomen: Flat, soft with bowel sounds active. Nontender, no guarding, rebound, hernias, masses, or organomegaly.  Lymphatics: Non tender without lymphadenopathy.  Genitourinary:  Musculoskeletal: Full ROM all peripheral extremities, joint stability, 5/5 strength, and normal gait. Skin: Warm and dry without rashes, lesions, cyanosis, clubbing or  ecchymosis.  Neuro: Cranial nerves intact, reflexes equal bilaterally. Normal muscle tone, no cerebellar symptoms. Sensation intact.  Pysch: Alert and oriented X 3, normal affect, Insight and Judgment appropriate.   Assessment and Plan  1. Annual Preventative Screening Examination  2. Labile hypertension (2000)  - EKG 12-Lead - Urinalysis, Routine w reflex microscopic -  Microalbumin / creatinine urine ratio - CBC with Differential/Platelet - BASIC METABOLIC PANEL WITH GFR - Magnesium - TSH  3. Hyperlipidemia, mixed  - EKG 12-Lead - Hepatic function panel - Lipid panel - TSH  4. Prediabetes (06/2009)  - EKG 12-Lead - Hemoglobin A1c - Insulin, random  5. Vitamin D deficiency  - VITAMIN D 25 Hydroxy  6. Abnormal glucose  - Hemoglobin A1c - Insulin, random  7. Hypothyroidism  - TSH  8. History of breast cancer   9. Screening for colorectal cancer  - POC  Hemoccult Bld/Stl  10. Screening for ischemic heart disease  - EKG 12-Lead  11. Medication management  - Urinalysis, Routine w reflex microscopic - Microalbumin / creatinine urine ratio - CBC with Differential/Platelet - BASIC METABOLIC PANEL WITH GFR - Hepatic function panel - Magnesium - Lipid panel - TSH - Hemoglobin A1c - Insulin, random - VITAMIN D 25 Hydroxy         Patient was counseled in prudent diet to achieve/maintain BMI less than 25 for weight control, BP monitoring, regular exercise and medications. Discussed med's effects and SE's. Screening labs and tests as requested with regular follow-up as recommended. Over 40 minutes of exam, counseling, chart review and high complex critical decision making was performed.

## 2017-01-13 NOTE — Patient Instructions (Signed)

## 2017-01-14 ENCOUNTER — Ambulatory Visit: Payer: Medicare Other | Admitting: Internal Medicine

## 2017-01-14 ENCOUNTER — Encounter: Payer: Self-pay | Admitting: Internal Medicine

## 2017-01-14 VITALS — BP 128/78 | HR 64 | Temp 97.2°F | Resp 16 | Ht 60.0 in | Wt 158.6 lb

## 2017-01-14 DIAGNOSIS — R7309 Other abnormal glucose: Secondary | ICD-10-CM

## 2017-01-14 DIAGNOSIS — I10 Essential (primary) hypertension: Secondary | ICD-10-CM | POA: Diagnosis not present

## 2017-01-14 DIAGNOSIS — Z853 Personal history of malignant neoplasm of breast: Secondary | ICD-10-CM

## 2017-01-14 DIAGNOSIS — R0989 Other specified symptoms and signs involving the circulatory and respiratory systems: Secondary | ICD-10-CM

## 2017-01-14 DIAGNOSIS — E039 Hypothyroidism, unspecified: Secondary | ICD-10-CM

## 2017-01-14 DIAGNOSIS — E782 Mixed hyperlipidemia: Secondary | ICD-10-CM

## 2017-01-14 DIAGNOSIS — E559 Vitamin D deficiency, unspecified: Secondary | ICD-10-CM

## 2017-01-14 DIAGNOSIS — Z136 Encounter for screening for cardiovascular disorders: Secondary | ICD-10-CM

## 2017-01-14 DIAGNOSIS — R7303 Prediabetes: Secondary | ICD-10-CM

## 2017-01-14 DIAGNOSIS — Z0001 Encounter for general adult medical examination with abnormal findings: Secondary | ICD-10-CM

## 2017-01-14 DIAGNOSIS — Z0000 Encounter for general adult medical examination without abnormal findings: Secondary | ICD-10-CM | POA: Diagnosis not present

## 2017-01-14 DIAGNOSIS — Z1211 Encounter for screening for malignant neoplasm of colon: Secondary | ICD-10-CM

## 2017-01-14 DIAGNOSIS — Z1212 Encounter for screening for malignant neoplasm of rectum: Secondary | ICD-10-CM

## 2017-01-14 DIAGNOSIS — Z79899 Other long term (current) drug therapy: Secondary | ICD-10-CM

## 2017-01-15 LAB — URINALYSIS, ROUTINE W REFLEX MICROSCOPIC
Bilirubin Urine: NEGATIVE
Glucose, UA: NEGATIVE
Hgb urine dipstick: NEGATIVE
Hyaline Cast: NONE SEEN /LPF
Ketones, ur: NEGATIVE
Nitrite: NEGATIVE
PH: 7.5 (ref 5.0–8.0)
Protein, ur: NEGATIVE
RBC / HPF: NONE SEEN /HPF (ref 0–2)
SPECIFIC GRAVITY, URINE: 1.019 (ref 1.001–1.03)

## 2017-01-15 LAB — HEPATIC FUNCTION PANEL
AG Ratio: 1.1 (calc) (ref 1.0–2.5)
ALKALINE PHOSPHATASE (APISO): 57 U/L (ref 33–130)
ALT: 7 U/L (ref 6–29)
AST: 24 U/L (ref 10–35)
Albumin: 3.7 g/dL (ref 3.6–5.1)
Bilirubin, Direct: 0.1 mg/dL (ref 0.0–0.2)
Globulin: 3.5 g/dL (calc) (ref 1.9–3.7)
Indirect Bilirubin: 0.3 mg/dL (calc) (ref 0.2–1.2)
TOTAL PROTEIN: 7.2 g/dL (ref 6.1–8.1)
Total Bilirubin: 0.4 mg/dL (ref 0.2–1.2)

## 2017-01-15 LAB — TSH: TSH: 0.77 mIU/L (ref 0.40–4.50)

## 2017-01-15 LAB — VITAMIN D 25 HYDROXY (VIT D DEFICIENCY, FRACTURES): Vit D, 25-Hydroxy: 77 ng/mL (ref 30–100)

## 2017-01-15 LAB — MICROALBUMIN / CREATININE URINE RATIO
CREATININE, URINE: 137 mg/dL (ref 20–275)
Microalb Creat Ratio: 3 mcg/mg creat (ref <30)
Microalb, Ur: 0.4 mg/dL

## 2017-01-15 LAB — BASIC METABOLIC PANEL WITH GFR
BUN/Creatinine Ratio: 21 (calc) (ref 6–22)
BUN: 19 mg/dL (ref 7–25)
CALCIUM: 9.2 mg/dL (ref 8.6–10.4)
CO2: 28 mmol/L (ref 20–32)
Chloride: 101 mmol/L (ref 98–110)
Creat: 0.92 mg/dL — ABNORMAL HIGH (ref 0.60–0.88)
GFR, Est African American: 68 mL/min/{1.73_m2} (ref >=60)
GFR, Est Non African American: 59 mL/min/{1.73_m2} — ABNORMAL LOW (ref >=60)
GLUCOSE: 91 mg/dL (ref 65–99)
POTASSIUM: 4.6 mmol/L (ref 3.5–5.3)
SODIUM: 137 mmol/L (ref 135–146)

## 2017-01-15 LAB — LIPID PANEL
CHOL/HDL RATIO: 1.8 (calc) (ref <5.0)
CHOLESTEROL: 169 mg/dL (ref <200)
HDL: 96 mg/dL (ref >50)
LDL CHOLESTEROL (CALC): 58 mg/dL
Non-HDL Cholesterol (Calc): 73 mg/dL (calc) (ref <130)
TRIGLYCERIDES: 73 mg/dL (ref <150)

## 2017-01-15 LAB — INSULIN, RANDOM: INSULIN: 6.3 u[IU]/mL (ref 2.0–19.6)

## 2017-01-15 LAB — CBC WITH DIFFERENTIAL/PLATELET
BASOS ABS: 28 {cells}/uL (ref 0–200)
Basophils Relative: 0.4 %
Eosinophils Absolute: 170 cells/uL (ref 15–500)
Eosinophils Relative: 2.4 %
HCT: 37.4 % (ref 35.0–45.0)
HEMOGLOBIN: 12.2 g/dL (ref 11.7–15.5)
Lymphs Abs: 2400 cells/uL (ref 850–3900)
MCH: 27.2 pg (ref 27.0–33.0)
MCHC: 32.6 g/dL (ref 32.0–36.0)
MCV: 83.3 fL (ref 80.0–100.0)
MONOS PCT: 11.6 %
MPV: 12.1 fL (ref 7.5–12.5)
NEUTROS ABS: 3678 {cells}/uL (ref 1500–7800)
Neutrophils Relative %: 51.8 %
Platelets: 167 10*3/uL (ref 140–400)
RBC: 4.49 10*6/uL (ref 3.80–5.10)
RDW: 13 % (ref 11.0–15.0)
Total Lymphocyte: 33.8 %
WBC mixed population: 824 cells/uL (ref 200–950)
WBC: 7.1 10*3/uL (ref 3.8–10.8)

## 2017-01-15 LAB — HEMOGLOBIN A1C
Hgb A1c MFr Bld: 6 % of total Hgb — ABNORMAL HIGH (ref <5.7)
Mean Plasma Glucose: 126 (calc)
eAG (mmol/L): 7 (calc)

## 2017-01-15 LAB — MAGNESIUM: Magnesium: 1.9 mg/dL (ref 1.5–2.5)

## 2017-01-28 ENCOUNTER — Other Ambulatory Visit: Payer: Self-pay

## 2017-01-28 DIAGNOSIS — Z1212 Encounter for screening for malignant neoplasm of rectum: Principal | ICD-10-CM

## 2017-01-28 DIAGNOSIS — Z1211 Encounter for screening for malignant neoplasm of colon: Secondary | ICD-10-CM

## 2017-01-28 LAB — POC HEMOCCULT BLD/STL (HOME/3-CARD/SCREEN)
FECAL OCCULT BLD: NEGATIVE
FECAL OCCULT BLD: NEGATIVE
Fecal Occult Blood, POC: NEGATIVE

## 2017-01-29 ENCOUNTER — Other Ambulatory Visit: Payer: Self-pay | Admitting: Hematology and Oncology

## 2017-01-30 DIAGNOSIS — Z1212 Encounter for screening for malignant neoplasm of rectum: Secondary | ICD-10-CM

## 2017-02-21 ENCOUNTER — Inpatient Hospital Stay: Payer: Medicare Other | Attending: Hematology and Oncology | Admitting: Hematology and Oncology

## 2017-02-21 DIAGNOSIS — Z9223 Personal history of estrogen therapy: Secondary | ICD-10-CM | POA: Insufficient documentation

## 2017-02-21 DIAGNOSIS — Z7982 Long term (current) use of aspirin: Secondary | ICD-10-CM | POA: Insufficient documentation

## 2017-02-21 DIAGNOSIS — D696 Thrombocytopenia, unspecified: Secondary | ICD-10-CM | POA: Insufficient documentation

## 2017-02-21 DIAGNOSIS — Z170 Estrogen receptor positive status [ER+]: Secondary | ICD-10-CM | POA: Insufficient documentation

## 2017-02-21 DIAGNOSIS — Z86000 Personal history of in-situ neoplasm of breast: Secondary | ICD-10-CM | POA: Insufficient documentation

## 2017-02-21 DIAGNOSIS — C50511 Malignant neoplasm of lower-outer quadrant of right female breast: Secondary | ICD-10-CM

## 2017-02-21 DIAGNOSIS — Z79899 Other long term (current) drug therapy: Secondary | ICD-10-CM | POA: Insufficient documentation

## 2017-02-21 DIAGNOSIS — Z9011 Acquired absence of right breast and nipple: Secondary | ICD-10-CM | POA: Insufficient documentation

## 2017-02-21 NOTE — Assessment & Plan Note (Addendum)
Right breast DCIS intermediate grade ER/PR positive status post mastectomy currently on hormonal therapy with tamoxifen 20 mg daily since 2013  Tamoxifen toxicities: No side effects of tamoxifen Patient completed 5 years of therapy by Sept 2018.  Breast cancer surveillance: 1. Mammograms done  07/19/2016 were without any evidence of malignancy. Breast density category C 2. Breasts exam done 02/21/2017 is without any lumps or nodules  Previous Blood work revealed mild thrombocytopenia with a platelet count of 137. It is of no clinical significance.  Return to clinic in 1 year for follow-up With survivorship

## 2017-02-21 NOTE — Progress Notes (Signed)
Patient Care Team: [name], MD as PCP - General (Internal Medicine) Nicholas Lose, MD as Consulting Physician (Hematology and Oncology) Sable Feil, MD as Consulting Physician (Gastroenterology) Coralie Keens, MD as Consulting Physician (General Surgery)  DIAGNOSIS:  Encounter Diagnosis  Name Primary?  . Primary cancer of lower outer quadrant of right female breast ([place])     SUMMARY OF ONCOLOGIC HISTORY:   Primary cancer of lower outer quadrant of right female breast ([place])   08/13/2011 Surgery    Right Breast DCIS intermediate grade, 2 foci 1.2 cm and 1.1 cm, ER 100%, PR 76% S/P mastectomy      09/12/2011 - 02/21/2017 Anti-estrogen oral therapy    Tamoxifen 20 mg daily Plan is for 5 years       CHIEF COMPLIANT: Follow-up of DCIS on tamoxifen  INTERVAL HISTORY: Robin Huynh is a 83 year old with above-mentioned history of DCIS who has been on tamoxifen for the past 5 years.  Tolerated extremely well.  She denies any lumps or nodules in the breast.  She has fantastic performance status.  She reports no health issues or concerns.  She denies any hot flashes or myalgias from tamoxifen.  REVIEW OF SYSTEMS:   Constitutional: Denies fevers, chills or abnormal weight loss Eyes: Denies blurriness of vision Ears, nose, mouth, throat, and face: Denies mucositis or sore throat Respiratory: Denies cough, dyspnea or wheezes Cardiovascular: Denies palpitation, chest discomfort Gastrointestinal:  Denies nausea, heartburn or change in bowel habits Skin: Denies abnormal skin rashes Lymphatics: Denies new lymphadenopathy or easy bruising Neurological:Denies numbness, tingling or new weaknesses Behavioral/Psych: Mood is stable, no new changes  Extremities: No lower extremity edema Breast:  denies any pain or lumps or nodules in either breasts All other systems were reviewed with the patient and are negative.  I have reviewed the past medical history, past surgical  history, social history and family history with the patient and they are unchanged from previous note.  ALLERGIES:  has No Known Allergies.  MEDICATIONS:  Current Outpatient Medications  Medication Sig Dispense Refill  . aspirin 81 MG tablet Take 81 mg by mouth daily.    Marland Kitchen CALCIUM PO Take 1 tablet by mouth daily.    . Cholecalciferol (VITAMIN D) 2000 UNITS tablet Take 2,000 Units by mouth 3 (three) times daily.    . fish oil-omega-3 fatty acids 1000 MG capsule Take 2 g by mouth daily.    . fluticasone (FLONASE) 50 MCG/ACT nasal spray Place 2 sprays into both nostrils daily. 16 g 0  . levothyroxine (SYNTHROID, LEVOTHROID) 50 MCG tablet TAKE ONE TABLET BY MOUTH ONCE DAILY 90 tablet 1  . magnesium 30 MG tablet Take 30 mg by mouth 2 (two) times daily.    . Multiple Vitamin (MULTIVITAMIN) tablet Take 1 tablet by mouth daily.    . Red Yeast Rice 600 MG CAPS Take 600 mg by mouth daily.     . vitamin C (ASCORBIC ACID) 500 MG tablet Take 500 mg by mouth daily.     No current facility-administered medications for this visit.     PHYSICAL EXAMINATION: ECOG PERFORMANCE STATUS: 0 - Asymptomatic  Vitals:   02/21/17 1018  BP: (!) 151/61  Pulse: 66  Resp: 17  Temp: 98.2 F (36.8 C)  SpO2: 99%   Filed Weights   02/21/17 1018  Weight: 159 lb 9.6 oz (72.4 kg)    GENERAL:alert, no distress and comfortable SKIN: skin color, texture, turgor are normal, no rashes or significant lesions EYES: normal,  Conjunctiva are pink and non-injected, sclera clear OROPHARYNX:no exudate, no erythema and lips, buccal mucosa, and tongue normal  NECK: supple, thyroid normal size, non-tender, without nodularity LYMPH:  no palpable lymphadenopathy in the cervical, axillary or inguinal LUNGS: clear to auscultation and percussion with normal breathing effort HEART: regular rate & rhythm and no murmurs and no lower extremity edema ABDOMEN:abdomen soft, non-tender and normal bowel sounds MUSCULOSKELETAL:no cyanosis  of digits and no clubbing  NEURO: alert & oriented x 3 with fluent speech, no focal motor/sensory deficits EXTREMITIES: No lower extremity edema BREAST: No palpable masses or nodules in either right or left breasts. No palpable axillary supraclavicular or infraclavicular adenopathy no breast tenderness or nipple discharge. (exam performed in the presence of a chaperone)  LABORATORY DATA:  I have reviewed the data as listed CMP Latest Ref Rng & Units 01/14/2017 06/22/2016 03/16/2016  Glucose 65 - 99 mg/dL 91 92 90  BUN 7 - 25 mg/dL 19 15 19   Creatinine 0.60 - 0.88 mg/dL 0.92(H) 0.97(H) 0.89  Sodium 135 - 146 mmol/L 137 138 139  Potassium 3.5 - 5.3 mmol/L 4.6 4.4 4.4  Chloride 98 - 110 mmol/L 101 102 102  CO2 20 - 32 mmol/L 28 28 27   Calcium 8.6 - 10.4 mg/dL 9.2 9.5 9.4  Total Protein 6.1 - 8.1 g/dL 7.2 6.7 6.9  Total Bilirubin 0.2 - 1.2 mg/dL 0.4 0.3 0.3  Alkaline Phos 33 - 130 U/L - 49 55  AST 10 - 35 U/L 24 22 29   ALT 6 - 29 U/L 7 6 9     Lab Results  Component Value Date   WBC 7.1 01/14/2017   HGB 12.2 01/14/2017   HCT 37.4 01/14/2017   MCV 83.3 01/14/2017   PLT 167 01/14/2017   NEUTROABS 3,678 01/14/2017    ASSESSMENT & PLAN:  Primary cancer of lower outer quadrant of right female breast Right breast DCIS intermediate grade ER/PR positive status post mastectomy currently on hormonal therapy with tamoxifen 20 mg daily since 2013  Tamoxifen toxicities: No side effects of tamoxifen Patient completed 5 years of therapy by Sept 2018.  However she continued to take it until now.  I instructed her that she can stop taking tamoxifen at this time.  Breast cancer surveillance: 1. Mammograms done  07/19/2016 were without any evidence of malignancy. Breast density category C 2. Breasts exam done 02/21/2017 is without any lumps or nodules  Previous Blood work revealed mild thrombocytopenia with a platelet count of 137. It is of no clinical significance.  I discussed with her that she  is welcome to follow-up with [place] annually for breast exams.  She decided to follow with her primary care physician and will call us if there was any question or concern and she can be seen on an as-needed basis.   I spent 25 minutes talking to the patient of which more than half was spent in counseling and coordination of care.  No orders of the defined types were placed in this encounter.  The patient has a good understanding of the overall plan. she agrees with it. she will call with any problems that may develop before the next visit here.   Harriette Ohara, MD 02/21/17

## 2017-03-14 ENCOUNTER — Other Ambulatory Visit: Payer: Self-pay | Admitting: Internal Medicine

## 2017-03-14 MED ORDER — LEVOTHYROXINE SODIUM 50 MCG PO TABS: ORAL_TABLET | ORAL | 1 refills | Status: DC

## 2017-04-16 NOTE — Progress Notes (Signed)
MEDICARE ANNUAL WELLNESS VISIT AND FOLLOW UP  Assessment:   Diagnoses and all orders for this visit:  Encounter for Medicare annual wellness exam  Labile hypertension (2000) At goal today; managed by lifestyle only Monitor blood pressure at home; call if consistently over 130/80 Continue DASH diet.   Reminder to go to the ER if any CP, SOB, nausea, dizziness, severe HA, changes vision/speech, left arm numbness and tingling and jaw pain.  Diverticulosis Increase fiber, bowel management discussed, continue to monitor  Hypothyroidism, unspecified type continue medications the same pending lab results reminded to take on an empty stomach 30-68mins before food.  check TSH level  Vitamin D deficiency At goal at recent check; continue to recommend supplementation for goal of 70-100 Defer vitamin D level  Primary cancer of lower outer quadrant of right female breast [facility]) S/p mastectomy, annual MMGs, continue follow up with oncology  Prediabetes (06/2009) Discussed disease and risks Discussed diet/exercise, weight management  A1C  Medication management CBC, BMP/GFR, LFTs  Hyperlipidemia At goal; continue with red yeast rice supplement Continue low cholesterol diet and exercise.  Check lipid panel.   Over 40 minutes of exam, counseling, chart review and critical decision making was performed Future Appointments  Date Time Provider [place]  07/19/2017 10:30 AM [name], MD [facility] None  02/07/2018 10:00 AM [name], MD [facility] None     Plan:   During the course of the visit the patient was educated and counseled about appropriate screening and preventive services including:    Pneumococcal vaccine   Prevnar 13  Influenza vaccine  Td vaccine  Screening electrocardiogram  Bone densitometry screening  Colorectal cancer screening  Diabetes screening  Glaucoma screening  Nutrition counseling   Advanced directives:  requested   Subjective:  Robin Huynh is a 83 y.o. female who presents for Medicare Annual Wellness Visit and 3 month follow up. She has hx/o R Mastectomy for Rt Breast DCIS (ER+) and is followed by Dr Lindi Adie, is finishing up a 5 year course of Tamoxifen with no recurrence by annual mammograms.   BMI is Body mass index is 30.86 kg/m., she has been working on diet and exercise. She has been focusing on fruits and vegetables and plans to cut down on bread.  Wt Readings from Last 3 Encounters:  04/17/17 158 lb (71.7 kg)  02/21/17 159 lb 9.6 oz (72.4 kg)  01/14/17 158 lb 9.6 oz (71.9 kg)    Her blood pressure has been controlled at home, today their BP is BP: 132/84 She does workout. She denies chest pain, shortness of breath, dizziness.   She is not on cholesterol medication (on red yeast rice supplement only) and denies myalgias. Her cholesterol is at goal. The cholesterol last visit was:   Lab Results  Component Value Date   CHOL 169 01/14/2017   HDL 96 01/14/2017   LDLCALC 58 01/14/2017   TRIG 73 01/14/2017   CHOLHDL 1.8 01/14/2017    She has been working on diet and exercise for prediabetes, and denies foot ulcerations, hyperglycemia, increased appetite, nausea, paresthesia of the feet, polydipsia, polyuria, visual disturbances, vomiting and weight loss. Last A1C in the office was:  Lab Results  Component Value Date   HGBA1C 6.0 (H) 01/14/2017   She is on thyroid medication. Her medication was not changed last visit.   Lab Results  Component Value Date   TSH 0.77 01/14/2017  . Last GFR: Lab Results  Component Value Date   GFRAA 68 01/14/2017  Patient is on Vitamin D supplement and at goal at recent check:    Lab Results  Component Value Date   VD25OH 77 01/14/2017      Medication Review: Current Outpatient Medications on File Prior to Visit  Medication Sig Dispense Refill  . aspirin 81 MG tablet Take 81 mg by mouth daily.    Marland Kitchen CALCIUM PO Take 1 tablet by mouth  daily.    . Cholecalciferol (VITAMIN D) 2000 UNITS tablet Take 2,000 Units by mouth 3 (three) times daily.    . fish oil-omega-3 fatty acids 1000 MG capsule Take 2 g by mouth daily.    . fluticasone (FLONASE) 50 MCG/ACT nasal spray Place 2 sprays into both nostrils daily. 16 g 0  . levothyroxine (SYNTHROID, LEVOTHROID) 50 MCG tablet Take 1 tablet 1st in the morning with only water & no other meds or food for 30 minutes 90 tablet 1  . magnesium 30 MG tablet Take 30 mg by mouth 2 (two) times daily.    . Multiple Vitamin (MULTIVITAMIN) tablet Take 1 tablet by mouth daily.    . Red Yeast Rice 600 MG CAPS Take 600 mg by mouth daily.     . vitamin C (ASCORBIC ACID) 500 MG tablet Take 500 mg by mouth daily.     No current facility-administered medications on file prior to visit.     No Known Allergies  Current Problems (verified) Patient Active Problem List   Diagnosis Date Noted  . Encounter for Medicare annual wellness exam 10/07/2014  . Hyperlipidemia 07/15/2013  . Medication management 06/30/2013  . Labile hypertension (2000)   . Hypothyroidism   . Prediabetes (06/2009)   . Vitamin D deficiency   . Diverticulosis   . Primary cancer of lower outer quadrant of right female breast ([place]) 06/21/2011    Screening Tests Immunization History  Administered Date(s) Administered  . Pneumococcal Conjugate-13 03/01/2014  . Pneumococcal Polysaccharide-23 01/09/2007  . Td 05/18/2008   Preventative care: Last colonoscopy: 2014 10 year follow up ? Or done Last mammogram: 07/2016 DEXA:07/07/14 - normal - wants to defer PAP 2012 US thyroid with bx 02/2015 negative  Prior vaccinations: TD or Tdap: 2010  Influenza: Declined  Pneumococcal: 2009 Prevnar13: 2010 Shingles/Zostavax: 2010  Names of Other Physician/Practitioners you currently use: 1. [facility]- here for primary care 2. [facility] [employer] [facility], eye doctor, last visit 2018 3. Dr.  Toy Cookey,  dentist, last visit 2019  Patient Care Team: [name], MD as PCP - General (Internal Medicine) Nicholas Lose, MD as Consulting Physician (Hematology and Oncology) Sable Feil, MD as Consulting Physician (Gastroenterology) Coralie Keens, MD as Consulting Physician (General Surgery)  SURGICAL HISTORY She  has a past surgical history that includes Breast surgery; Colonoscopy; Mastectomy complete / simple w/ sentinel node biopsy (08/10/11); and Mastectomy w/ sentinel node biopsy (08/10/2011). FAMILY HISTORY Her family history includes Colon cancer (age of onset: 62) in her mother; Kidney cancer in her brother; Lung cancer in her brother and sister; Prostate cancer in her father. SOCIAL HISTORY She  reports that she has never smoked. She has never used smokeless tobacco. She reports that she does not drink alcohol or use drugs.   MEDICARE WELLNESS OBJECTIVES: Physical activity: Current Exercise Habits: Home exercise routine, Type of exercise: walking;treadmill;strength training/weights, Time (Minutes): 60, Frequency (Times/Week): 5, Weekly Exercise (Minutes/Week): 300, Intensity: Moderate, Exercise limited by: None identified Cardiac risk factors: Cardiac Risk Factors include: advanced age (>69men, >80 women);hypertension;dyslipidemia Depression/mood screen:  Depression screen PHQ 2/9 04/17/2017  Decreased Interest 0  Down, Depressed, Hopeless 0  PHQ - 2 Score 0    ADLs:  In your present state of health, do you have any difficulty performing the following activities: 04/17/2017 01/14/2017  Hearing? N N  Vision? N N  Difficulty concentrating or making decisions? N N  Walking or climbing stairs? N N  Dressing or bathing? N N  Doing errands, shopping? N N  Some recent data might be hidden     Cognitive Testing  Alert? Yes  Normal Appearance?Yes  Oriented to person? Yes  Place? Yes   Time? Yes  Recall of three objects?  Yes  Can perform simple calculations? Yes  Displays  appropriate judgment?Yes  Can read the correct time from a watch face?Yes  EOL planning: Does Patient Have a Medical Advance Directive?: Yes Type of Advance Directive: Healthcare Power of Attorney, Living will [place] in Chart?: No - copy requested  Review of Systems  Constitutional: Negative for malaise/fatigue and weight loss.  HENT: Negative for hearing loss and tinnitus.   Eyes: Negative for blurred vision and double vision.  Respiratory: Negative for cough, sputum production, shortness of breath and wheezing.   Cardiovascular: Negative for chest pain, palpitations, orthopnea, claudication, leg swelling and PND.  Gastrointestinal: Negative for abdominal pain, blood in stool, constipation, diarrhea, heartburn, melena, nausea and vomiting.  Genitourinary: Negative.   Musculoskeletal: Negative for falls, joint pain and myalgias.  Skin: Negative for rash.  Neurological: Negative for dizziness, tingling, sensory change, weakness and headaches.  Endo/Heme/Allergies: Negative for polydipsia.  Psychiatric/Behavioral: Negative.  Negative for depression, memory loss, substance abuse and suicidal ideas. The patient is not nervous/anxious and does not have insomnia.   All other systems reviewed and are negative.    Objective:     Today's Vitals   04/17/17 1005 04/17/17 1031  BP: (!) 142/72 132/84  Pulse: (!) 58   Temp: 97.7 F (36.5 C)   SpO2: 99%   Weight: 158 lb (71.7 kg)   Height: 5' (1.524 m)    Body mass index is 30.86 kg/m.  General appearance: alert, no distress, WD/WN, female HEENT: normocephalic, sclerae anicteric, TMs pearly, nares patent, no discharge or erythema, pharynx normal Oral cavity: MMM, no lesions Neck: supple, no lymphadenopathy, no thyromegaly, no masses Heart: RRR, normal S1, S2, no murmurs Lungs: CTA bilaterally, no wheezes, rhonchi, or rales Abdomen: +bs, soft, non tender, non distended, no masses, no hepatomegaly, no  splenomegaly Musculoskeletal: nontender, no swelling, no obvious deformity Extremities: no edema, no cyanosis, no clubbing Pulses: 2+ symmetric, upper and lower extremities, normal cap refill Neurological: alert, oriented x 3, CN2-12 intact, strength normal upper extremities and lower extremities, sensation normal throughout, DTRs 2+ throughout, no cerebellar signs, gait normal Psychiatric: normal affect, behavior normal, pleasant   Medicare Attestation I have personally reviewed: The patient's medical and social history Their use of alcohol, tobacco or illicit drugs Their current medications and supplements The patient's functional ability including ADLs,fall risks, home safety risks, cognitive, and hearing and visual impairment Diet and physical activities Evidence for depression or mood disorders  The patient's weight, height, BMI, and visual acuity have been recorded in the chart.  I have made referrals, counseling, and provided education to the patient based on review of the above and I have provided the patient with a written personalized care plan for preventive services.     Robin Ribas, NP   04/17/2017

## 2017-04-17 ENCOUNTER — Encounter: Payer: Self-pay | Admitting: Adult Health

## 2017-04-17 ENCOUNTER — Ambulatory Visit: Payer: Medicare Other | Admitting: Adult Health

## 2017-04-17 VITALS — BP 132/84 | HR 58 | Temp 97.7°F | Ht 60.0 in | Wt 158.0 lb

## 2017-04-17 DIAGNOSIS — R0989 Other specified symptoms and signs involving the circulatory and respiratory systems: Secondary | ICD-10-CM

## 2017-04-17 DIAGNOSIS — E782 Mixed hyperlipidemia: Secondary | ICD-10-CM | POA: Diagnosis not present

## 2017-04-17 DIAGNOSIS — R7303 Prediabetes: Secondary | ICD-10-CM

## 2017-04-17 DIAGNOSIS — K5790 Diverticulosis of intestine, part unspecified, without perforation or abscess without bleeding: Secondary | ICD-10-CM | POA: Diagnosis not present

## 2017-04-17 DIAGNOSIS — Z0000 Encounter for general adult medical examination without abnormal findings: Secondary | ICD-10-CM | POA: Diagnosis not present

## 2017-04-17 DIAGNOSIS — C50511 Malignant neoplasm of lower-outer quadrant of right female breast: Secondary | ICD-10-CM | POA: Diagnosis not present

## 2017-04-17 DIAGNOSIS — E039 Hypothyroidism, unspecified: Secondary | ICD-10-CM | POA: Diagnosis not present

## 2017-04-17 DIAGNOSIS — E559 Vitamin D deficiency, unspecified: Secondary | ICD-10-CM

## 2017-04-17 DIAGNOSIS — R6889 Other general symptoms and signs: Secondary | ICD-10-CM

## 2017-04-17 DIAGNOSIS — Z79899 Other long term (current) drug therapy: Secondary | ICD-10-CM

## 2017-04-17 NOTE — Patient Instructions (Signed)
Aim for 7+ servings of fruits and vegetables daily  80+ fluid ounces of water or unsweet tea for healthy kidneys  Limit alcohol intake  Limit animal fats in diet for cholesterol and heart health - choose grass fed whenever available  Aim for low stress - take time to unwind and care for your mental health  Aim for 150 min of moderate intensity exercise weekly for heart health, and weights twice weekly for bone health  Aim for 7-9 hours of sleep daily      When it comes to diets, agreement about the perfect plan isn't easy to find, even among the experts. Experts at the [employer] developed an idea known as the Healthy Eating Plate. Just imagine a plate divided into logical, healthy portions.  The emphasis is on diet quality:  Load up on vegetables and fruits - one-half of your plate: Aim for color and variety, and remember that potatoes don't count.  Go for whole grains - one-quarter of your plate: Whole wheat, barley, wheat berries, quinoa, oats, brown rice, and foods made with them. If you want pasta, go with whole wheat pasta.  Protein power - one-quarter of your plate: Fish, chicken, beans, and nuts are all healthy, versatile protein sources. Limit red meat.  The diet, however, does go beyond the plate, offering a few other suggestions.  Use healthy plant oils, such as olive, canola, soy, corn, sunflower and peanut. Check the labels, and avoid partially hydrogenated oil, which have unhealthy trans fats.  If you're thirsty, drink water. Coffee and tea are good in moderation, but skip sugary drinks and limit milk and dairy products to one or two daily servings.  The type of carbohydrate in the diet is more important than the amount. Some sources of carbohydrates, such as vegetables, fruits, whole grains, and beans-are healthier than others.  Finally, stay active.  

## 2017-04-18 LAB — CBC WITH DIFFERENTIAL/PLATELET
BASOS PCT: 0.3 %
Basophils Absolute: 19 cells/uL (ref 0–200)
EOS ABS: 109 {cells}/uL (ref 15–500)
Eosinophils Relative: 1.7 %
HCT: 37.3 % (ref 35.0–45.0)
HEMOGLOBIN: 12.1 g/dL (ref 11.7–15.5)
LYMPHS ABS: 2330 {cells}/uL (ref 850–3900)
MCH: 26.9 pg — AB (ref 27.0–33.0)
MCHC: 32.4 g/dL (ref 32.0–36.0)
MCV: 82.9 fL (ref 80.0–100.0)
MPV: 12.6 fL — AB (ref 7.5–12.5)
Monocytes Relative: 15.1 %
NEUTROS ABS: 2976 {cells}/uL (ref 1500–7800)
Neutrophils Relative %: 46.5 %
Platelets: 134 10*3/uL — ABNORMAL LOW (ref 140–400)
RBC: 4.5 10*6/uL (ref 3.80–5.10)
RDW: 13 % (ref 11.0–15.0)
Total Lymphocyte: 36.4 %
WBC: 6.4 10*3/uL (ref 3.8–10.8)
WBCMIX: 966 {cells}/uL — AB (ref 200–950)

## 2017-04-18 LAB — LIPID PANEL
CHOL/HDL RATIO: 2.2 (calc) (ref <5.0)
Cholesterol: 160 mg/dL (ref <200)
HDL: 74 mg/dL (ref >50)
LDL CHOLESTEROL (CALC): 72 mg/dL
Non-HDL Cholesterol (Calc): 86 mg/dL (calc) (ref <130)
TRIGLYCERIDES: 61 mg/dL (ref <150)

## 2017-04-18 LAB — BASIC METABOLIC PANEL WITH GFR
BUN: 18 mg/dL (ref 7–25)
CO2: 30 mmol/L (ref 20–32)
CREATININE: 0.88 mg/dL (ref 0.60–0.88)
Calcium: 9 mg/dL (ref 8.6–10.4)
Chloride: 100 mmol/L (ref 98–110)
GFR, Est African American: 72 mL/min/{1.73_m2} (ref >=60)
GFR, Est Non African American: 62 mL/min/{1.73_m2} (ref >=60)
GLUCOSE: 91 mg/dL (ref 65–99)
Potassium: 4.3 mmol/L (ref 3.5–5.3)
Sodium: 135 mmol/L (ref 135–146)

## 2017-04-18 LAB — HEPATIC FUNCTION PANEL
AG Ratio: 1.2 (calc) (ref 1.0–2.5)
ALT: 6 U/L (ref 6–29)
AST: 25 U/L (ref 10–35)
Albumin: 3.7 g/dL (ref 3.6–5.1)
Alkaline phosphatase (APISO): 56 U/L (ref 33–130)
Bilirubin, Direct: 0.1 mg/dL (ref 0.0–0.2)
GLOBULIN: 3.1 g/dL (ref 1.9–3.7)
Indirect Bilirubin: 0.2 mg/dL (calc) (ref 0.2–1.2)
TOTAL PROTEIN: 6.8 g/dL (ref 6.1–8.1)
Total Bilirubin: 0.3 mg/dL (ref 0.2–1.2)

## 2017-04-18 LAB — HEMOGLOBIN A1C
Hgb A1c MFr Bld: 6 % of total Hgb — ABNORMAL HIGH (ref <5.7)
Mean Plasma Glucose: 126 (calc)
eAG (mmol/L): 7 (calc)

## 2017-04-18 LAB — TSH: TSH: 0.53 mIU/L (ref 0.40–4.50)

## 2017-07-19 ENCOUNTER — Ambulatory Visit: Payer: Medicare Other | Admitting: Internal Medicine

## 2017-07-19 VITALS — BP 120/74 | HR 60 | Temp 97.5°F | Resp 16 | Ht 60.0 in | Wt 156.0 lb

## 2017-07-19 DIAGNOSIS — E782 Mixed hyperlipidemia: Secondary | ICD-10-CM | POA: Diagnosis not present

## 2017-07-19 DIAGNOSIS — R7309 Other abnormal glucose: Secondary | ICD-10-CM | POA: Diagnosis not present

## 2017-07-19 DIAGNOSIS — R7303 Prediabetes: Secondary | ICD-10-CM | POA: Diagnosis not present

## 2017-07-19 DIAGNOSIS — R0989 Other specified symptoms and signs involving the circulatory and respiratory systems: Secondary | ICD-10-CM | POA: Diagnosis not present

## 2017-07-19 DIAGNOSIS — Z79899 Other long term (current) drug therapy: Secondary | ICD-10-CM

## 2017-07-19 DIAGNOSIS — Z853 Personal history of malignant neoplasm of breast: Secondary | ICD-10-CM | POA: Diagnosis not present

## 2017-07-19 DIAGNOSIS — E559 Vitamin D deficiency, unspecified: Secondary | ICD-10-CM | POA: Diagnosis not present

## 2017-07-19 NOTE — Progress Notes (Signed)
This very nice 83 y.o. MBF presents for 6 month follow up with HTN, HLD, Pre-Diabetes and Vitamin D Deficiency. In 2013, patient underwent Rt Mastectomy for ER+ DCIS and is followed by Dr Lindi Adie on tamoxifen.     Patient is monitored expectantly for labile HTN circa 2000 & BP has been controlled and today's BP: 120/74. Patient has had no complaints of any cardiac type chest pain, palpitations, dyspnea / orthopnea / PND, dizziness, claudication, or dependent edema.     Hyperlipidemia is controlled with diet & meds. Patient denies myalgias or other med SE's. Last Lipids were at goal: Lab Results  Component Value Date   CHOL 160 04/17/2017   HDL 74 04/17/2017   LDLCALC 72 04/17/2017   TRIG 61 04/17/2017   CHOLHDL 2.2 04/17/2017      Also, the patient has history of PreDiabetes (A1c 6.1%/2011 and 6.4%/2012) and has had no symptoms of reactive hypoglycemia, diabetic polys, paresthesias or visual blurring.  Last A1c was not at goal: Lab Results  Component Value Date   HGBA1C 6.0 (H) 04/17/2017      Patient was begun on Thyroid Replacement in 2011 for Hypothyroid Goiter.         Further, the patient also has history of Vitamin D Deficiency and supplements vitamin D without any suspected side-effects. Last vitamin D was at goal:  Lab Results  Component Value Date   VD25OH 77 01/14/2017   Current Outpatient Medications on File Prior to Visit  Medication Sig  . aspirin 81 MG tablet Take 81 mg by mouth daily.  Marland Kitchen CALCIUM PO Take 1 tablet by mouth daily.  . Cholecalciferol (VITAMIN D) 2000 UNITS tablet Take 2,000 Units by mouth 3 (three) times daily.  . fish oil-omega-3 fatty acids 1000 MG capsule Take 2 g by mouth daily.  Marland Kitchen levothyroxine (SYNTHROID, LEVOTHROID) 50 MCG tablet Take 1 tablet 1st in the morning with only water & no other meds or food for 30 minutes  . magnesium 30 MG tablet Take 30 mg by mouth 2 (two) times daily.  . Multiple Vitamin (MULTIVITAMIN) tablet Take 1 tablet by  mouth daily.  . Red Yeast Rice 600 MG CAPS Take 600 mg by mouth daily.   . vitamin C (ASCORBIC ACID) 500 MG tablet Take 500 mg by mouth daily.   No current facility-administered medications on file prior to visit.    No Known Allergies PMHx:   Past Medical History:  Diagnosis Date  . Breast cancer ([place]) 08/2011   right  . Diverticulosis   . Hypothyroidism   . Labile hypertension   . Prediabetes   . Thoracic aorta atherosclerosis ([place])    via CXR  . Vitamin D deficiency    Immunization History  Administered Date(s) Administered  . Pneumococcal Conjugate-13 03/01/2014  . Pneumococcal Polysaccharide-23 01/09/2007  . Td 05/18/2008   Past Surgical History:  Procedure Laterality Date  . BREAST SURGERY     right breast  . COLONOSCOPY    . MASTECTOMY COMPLETE / SIMPLE W/ SENTINEL NODE BIOPSY  08/10/11   right  . MASTECTOMY W/ SENTINEL NODE BIOPSY  08/10/2011   Procedure: MASTECTOMY WITH SENTINEL LYMPH NODE BIOPSY;  Surgeon: Harl Bowie, MD;  Location: [facility];  Service: General;  Laterality: Right;   FHx:    Reviewed / unchanged  SHx:    Reviewed / unchanged   Systems Review:  Constitutional: Denies fever, chills, wt changes, headaches, insomnia, fatigue, night sweats, change in appetite.  Eyes: Denies redness, blurred vision, diplopia, discharge, itchy, watery eyes.  ENT: Denies discharge, congestion, post nasal drip, epistaxis, sore throat, earache, hearing loss, dental pain, tinnitus, vertigo, sinus pain, snoring.  CV: Denies chest pain, palpitations, irregular heartbeat, syncope, dyspnea, diaphoresis, orthopnea, PND, claudication or edema. Respiratory: denies cough, dyspnea, DOE, pleurisy, hoarseness, laryngitis, wheezing.  Gastrointestinal: Denies dysphagia, odynophagia, heartburn, reflux, water brash, abdominal pain or cramps, nausea, vomiting, bloating, diarrhea, constipation, hematemesis, melena, hematochezia  or hemorrhoids. Genitourinary: Denies dysuria, frequency,  urgency, nocturia, hesitancy, discharge, hematuria or flank pain. Musculoskeletal: Denies arthralgias, myalgias, stiffness, jt. swelling, pain, limping or strain/sprain.  Skin: Denies pruritus, rash, hives, warts, acne, eczema or change in skin lesion(s). Neuro: No weakness, tremor, incoordination, spasms, paresthesia or pain. Psychiatric: Denies confusion, memory loss or sensory loss. Endo: Denies change in weight, skin or hair change.  Heme/Lymph: No excessive bleeding, bruising or enlarged lymph nodes.  Physical Exam  BP 120/74   Pulse 60   Temp (!) 97.5 F (36.4 C)   Resp 16   Ht 5' (1.524 m)   Wt 156 lb (70.8 kg)   BMI 30.47 kg/m   Appears  well nourished, well groomed  and in no distress.  Eyes: PERRLA, EOMs, conjunctiva no swelling or erythema. Sinuses: No frontal/maxillary tenderness ENT/Mouth: EAC's clear, TM's nl w/o erythema, bulging. Nares clear w/o erythema, swelling, exudates. Oropharynx clear without erythema or exudates. Oral hygiene is good. Tongue normal, non obstructing. Hearing intact.  Neck: Supple. Thyroid not palpable. Car 2+/2+ without bruits, nodes or JVD. Chest: Respirations nl with BS clear & equal w/o rales, rhonchi, wheezing or stridor.  Cor: Heart sounds normal w/ regular rate and rhythm without sig. murmurs, gallops, clicks or rubs. Peripheral pulses normal and equal  without edema.  Abdomen: Soft & bowel sounds normal. Non-tender w/o guarding, rebound, hernias, masses or organomegaly.  Lymphatics: Unremarkable.  Musculoskeletal: Full ROM all peripheral extremities, joint stability, 5/5 strength and normal gait.  Skin: Warm, dry without exposed rashes, lesions or ecchymosis apparent.  Neuro: Cranial nerves intact, reflexes equal bilaterally. Sensory-motor testing grossly intact. Tendon reflexes grossly intact.  Pysch: Alert & oriented x 3.  Insight and judgement nl & appropriate. No ideations.  Assessment and Plan:  1. Labile hypertension  -  Continue medication, monitor blood pressure at home.  - Continue DASH diet.  Reminder to go to the ER if any CP,  SOB, nausea, dizziness, severe HA, changes vision/speech.  - CBC with Differential/Platelet - COMPLETE METABOLIC PANEL WITH GFR - Magnesium - TSH  2. Hyperlipidemia, mixed  - Continue diet/meds, exercise,& lifestyle modifications.  - Continue monitor periodic cholesterol/liver & renal functions   - Lipid panel - TSH  3. Abnormal glucose  - Continue diet, exercise, lifestyle modifications.  - Monitor appropriate labs.  - Hemoglobin A1c - Insulin, random  4. Vitamin D deficiency  - Continue supplementation.  - VITAMIN D 25 Hydroxyl  5. Prediabetes  - Hemoglobin A1c - Insulin, random  6. History of breast cancer  7. Medication management  - CBC with Differential/Platelet - COMPLETE METABOLIC PANEL WITH GFR - Magnesium - Lipid panel - TSH - Hemoglobin A1c - Insulin, random - VITAMIN D 25 Hydroxyl      Discussed  regular exercise, BP monitoring, weight control to achieve/maintain BMI less than 25 and discussed med and SE's. Recommended labs to assess and monitor clinical status with further disposition pending results of labs. Over 30 minutes of exam, counseling, chart review was performed.

## 2017-07-19 NOTE — Patient Instructions (Signed)

## 2017-07-20 ENCOUNTER — Encounter: Payer: Self-pay | Admitting: Internal Medicine

## 2017-07-22 LAB — COMPLETE METABOLIC PANEL WITH GFR
AG Ratio: 1.2 (calc) (ref 1.0–2.5)
ALT: 13 U/L (ref 6–29)
AST: 36 U/L — AB (ref 10–35)
Albumin: 3.9 g/dL (ref 3.6–5.1)
Alkaline phosphatase (APISO): 71 U/L (ref 33–130)
BUN/Creatinine Ratio: 22 (calc) (ref 6–22)
BUN: 20 mg/dL (ref 7–25)
CALCIUM: 9.6 mg/dL (ref 8.6–10.4)
CO2: 27 mmol/L (ref 20–32)
CREATININE: 0.92 mg/dL — AB (ref 0.60–0.88)
Chloride: 102 mmol/L (ref 98–110)
GFR, EST NON AFRICAN AMERICAN: 58 mL/min/{1.73_m2} — AB (ref >=60)
GFR, Est African American: 68 mL/min/{1.73_m2} (ref >=60)
GLOBULIN: 3.3 g/dL (ref 1.9–3.7)
GLUCOSE: 96 mg/dL (ref 65–99)
Potassium: 4.4 mmol/L (ref 3.5–5.3)
SODIUM: 138 mmol/L (ref 135–146)
Total Bilirubin: 0.4 mg/dL (ref 0.2–1.2)
Total Protein: 7.2 g/dL (ref 6.1–8.1)

## 2017-07-22 LAB — CBC WITH DIFFERENTIAL/PLATELET
Basophils Absolute: 37 cells/uL (ref 0–200)
Basophils Relative: 0.6 %
EOS PCT: 2.1 %
Eosinophils Absolute: 130 cells/uL (ref 15–500)
HEMATOCRIT: 37.2 % (ref 35.0–45.0)
HEMOGLOBIN: 12.3 g/dL (ref 11.7–15.5)
LYMPHS ABS: 2182 {cells}/uL (ref 850–3900)
MCH: 27.6 pg (ref 27.0–33.0)
MCHC: 33.1 g/dL (ref 32.0–36.0)
MCV: 83.6 fL (ref 80.0–100.0)
MONOS PCT: 10.5 %
MPV: 12.2 fL (ref 7.5–12.5)
NEUTROS ABS: 3199 {cells}/uL (ref 1500–7800)
NEUTROS PCT: 51.6 %
Platelets: 156 10*3/uL (ref 140–400)
RBC: 4.45 10*6/uL (ref 3.80–5.10)
RDW: 13.6 % (ref 11.0–15.0)
Total Lymphocyte: 35.2 %
WBC mixed population: 651 cells/uL (ref 200–950)
WBC: 6.2 10*3/uL (ref 3.8–10.8)

## 2017-07-22 LAB — TSH: TSH: 0.71 mIU/L (ref 0.40–4.50)

## 2017-07-22 LAB — VITAMIN D 25 HYDROXY (VIT D DEFICIENCY, FRACTURES): VIT D 25 HYDROXY: 85 ng/mL (ref 30–100)

## 2017-07-22 LAB — MAGNESIUM: Magnesium: 1.6 mg/dL (ref 1.5–2.5)

## 2017-07-22 LAB — HEMOGLOBIN A1C
EAG (MMOL/L): 6.8 (calc)
Hgb A1c MFr Bld: 5.9 % of total Hgb — ABNORMAL HIGH (ref <5.7)
Mean Plasma Glucose: 123 (calc)

## 2017-07-22 LAB — LIPID PANEL
CHOL/HDL RATIO: 2.4 (calc) (ref <5.0)
CHOLESTEROL: 191 mg/dL (ref <200)
HDL: 81 mg/dL (ref >50)
LDL CHOLESTEROL (CALC): 94 mg/dL
Non-HDL Cholesterol (Calc): 110 mg/dL (calc) (ref <130)
TRIGLYCERIDES: 71 mg/dL (ref <150)

## 2017-07-22 LAB — INSULIN, RANDOM: INSULIN: 7.8 u[IU]/mL (ref 2.0–19.6)

## 2017-07-25 LAB — HM MAMMOGRAPHY

## 2017-08-08 ENCOUNTER — Encounter: Payer: Self-pay | Admitting: *Deleted

## 2017-10-30 ENCOUNTER — Ambulatory Visit: Payer: Medicare Other | Admitting: Physician Assistant

## 2017-10-30 ENCOUNTER — Encounter: Payer: Self-pay | Admitting: Physician Assistant

## 2017-10-30 VITALS — BP 138/60 | HR 67 | Temp 97.6°F | Resp 16 | Ht 60.0 in | Wt 155.8 lb

## 2017-10-30 DIAGNOSIS — Z79899 Other long term (current) drug therapy: Secondary | ICD-10-CM

## 2017-10-30 DIAGNOSIS — R0989 Other specified symptoms and signs involving the circulatory and respiratory systems: Secondary | ICD-10-CM

## 2017-10-30 DIAGNOSIS — E782 Mixed hyperlipidemia: Secondary | ICD-10-CM | POA: Diagnosis not present

## 2017-10-30 DIAGNOSIS — R7309 Other abnormal glucose: Secondary | ICD-10-CM

## 2017-10-30 MED ORDER — LEVOTHYROXINE SODIUM 50 MCG PO TABS: ORAL_TABLET | ORAL | 1 refills | Status: DC

## 2017-10-30 NOTE — Patient Instructions (Signed)
8 Critical Weight-Loss Tips That Aren't Diet and Exercise  1. STARVE THE DISTRACTIONS  All too often when we eat, we're also multitasking: watching TV, answering emails, scrolling through social media. These habits are detrimental to having a strong, clear, healthy relationship with food, and they can hinder our ability to make dietary changes.  In order to truly focus on what you're eating, how much you're eating, why you're eating those specific foods and, most importantly, how those foods make you feel, you need to starve the distractions. That means when you eat, just eat. Focus on your food, the process it went through to end up on your plate, where it came from and how it nourishes you. With this technique, you're more likely to finish a meal feeling satiated.  2.  CONSIDER WHAT YOU'RE NOT WILLING TO DO  This might sound counterintuitive, but it can help provide a "why" when motivation is waning. Declare, in writing, what you are unwilling to do, for example "I am unwilling to be the old dad who cannot play sports with my children".  So consider what you're not willing to accept, write it down, and keep it at the ready.  3.  STOP LABELING FOOD "GOOD" AND "BAD"  You've probably heard someone say they ate something "bad." Maybe you've even said it yourself.  The trouble with 'bad' foods isn't that they'll send you to the grave after a bite or two. The trouble comes when we eat excessive portions of really calorie-dense foods meal after meal, day after day.  Instead of labeling foods as good or bad, think about which foods you can eat a lot of, and which ones you should just eat a little of. Then, plan ways to eat the foods you really like in portions that fit with your overall goals. A good example of this would be having a slice of pizza alongside a club salad with chicken breast, avocado and a bit of dressing. This is vastly different than 3 slices of pizza, 4 breadsticks with cheese sauce  and half of a liter of regular soda.  4.  BRUSH YOUR TEETH AFTER YOU EAT  Getting your mindset in order is important, but sometimes small habits can make a big difference. After eating, you still have the taste of food in their mouth, which often causes people to eat more even if they are full or engage in a nibble or two of dessert.  Brushing your teeth will remove the taste of food from your mouth, and the clean, minty freshness will serve as a cue that mealtime is over.  5.  FOCUS ON CROWDING NOT CUTTING  The most common first step during 'dieting' is to cut. We cut our portion sizes down, we cut out 'bad' foods, we cut out entire food groups. This act of cutting puts us and our minds into scarcity mode.  When something is off-limits, even if you're able to avoid it for a while, you could end up bingeing on it later because you've gone so long without it. So, instead of cutting, focus on crowding. If you crowd your plate and fill it up with more foods like veggies and protein, it simply allows less room for the other stuff. In other words, shift your focus away from what you can't eat, and celebrate the foods that will help you reach your goals.  6.  TAKE TRACKING A STEP FURTHER  Track what you eat, when you ate it, how much you ate   and how that food made you feel. Being completely honest with yourself and writing down every single thing that passes through your lips will help you start to notice that maybe you actually do snack, possibly take in more sugar than you thought, eat when you're bored rather than just hungry or maybe that you have a habit of snacking before bed while watching TV.  The difference from simply tracking your food intake is you're taking into account how food makes you feel, as well as what you're doing while you're eating. This is about becoming more mindful of what, when and why you eat.  7.  PRIORITIZE GOOD SLEEP  One of the strongest risk factors for being  overweight is poor sleep. When you're feeling tired, you're more likely to choose unhealthy comfort foods and to skip your workout. Additionally, sleep deprivation may slow down your metabolism. [employer]! Therefore, sleeping 7-8 hours per night can help with weight loss without having to change your diet or increase your physical activity. And if you feel you snore and still wake up tired, talk with me about sleep apnea.  8.  SET ASIDE TIME TO DISCONNECT  Just get out there. Disconnect from the electronics and connect to the elements. Not only will this help reduce stress (a major factor in weight gain) by giving your mind a break from the constant stimulation we've all become so accustomed to, but it may also reprogram your brain to connect with yourself and what you're feeling.     When it comes to diets, agreement about the perfect plan isn't easy to find, even among the experts. Experts at the [employer] developed an idea known as the Healthy Eating Plate. Just imagine a plate divided into logical, healthy portions.  The emphasis is on diet quality:  Load up on vegetables and fruits - one-half of your plate: Aim for color and variety, and remember that potatoes don't count.  Go for whole grains - one-quarter of your plate: Whole wheat, barley, wheat berries, quinoa, oats, brown rice, and foods made with them. If you want pasta, go with whole wheat pasta.  Protein power - one-quarter of your plate: Fish, chicken, beans, and nuts are all healthy, versatile protein sources. Limit red meat.  The diet, however, does go beyond the plate, offering a few other suggestions.  Use healthy plant oils, such as olive, canola, soy, corn, sunflower and peanut. Check the labels, and avoid partially hydrogenated oil, which have unhealthy trans fats.  If you're thirsty, drink water. Coffee and tea are good in moderation, but skip sugary drinks and limit milk and dairy products to one or  two daily servings.  The type of carbohydrate in the diet is more important than the amount. Some sources of carbohydrates, such as vegetables, fruits, whole grains, and beans-are healthier than others.  Finally, stay active.

## 2017-10-30 NOTE — Progress Notes (Signed)
Assessment and Plan:   Labile hypertension (2000) - continue medications, DASH diet, exercise and monitor at home. Call if greater than 130/80.   Hypothyroidism, unspecified type Hypothyroidism-check TSH level, continue medications the same, reminded to take on an empty stomach 30-74mins before food.   Prediabetes (06/2009) Continue diet management  Hyperlipidemia -continue medications, check lipids, decrease fatty foods, increase activity.    Continue diet and meds as discussed. Further disposition pending results of labs.  Over 30 minutes of exam, counseling, chart review, and critical decision making was performed Future Appointments  Date Time Provider [place]  02/07/2018 10:00 AM [name], MD [id number] None    HPI 83 y.o. female  presents for 3 month follow up on hypertension, cholesterol, prediabetes, and vitamin D deficiency.   Her blood pressure has been controlled at home, today their BP is BP: 138/60  She does workout, goes to the [employer] 3 x a week and walks in between. She denies chest pain, shortness of breath, dizziness. Should get off tamoxifen in Feb.   She is not on cholesterol medication and denies myalgias. Her cholesterol is at goal. The cholesterol last visit was:   Lab Results  Component Value Date   CHOL 191 07/19/2017   HDL 81 07/19/2017   LDLCALC 94 07/19/2017   TRIG 71 07/19/2017   CHOLHDL 2.4 07/19/2017    She has been working on diet and exercise for prediabetes but last visit she was in the DM range, and denies paresthesia of the feet, polydipsia, polyuria and visual disturbances. Last A1C in the office was:  Lab Results  Component Value Date   HGBA1C 5.9 (H) 07/19/2017   BMI is Body mass index is 30.43 kg/m., she is working on diet and exercise. Wt Readings from Last 3 Encounters:  10/30/17 155 lb 12.8 oz (70.7 kg)  07/19/17 156 lb (70.8 kg)  04/17/17 158 lb (71.7 kg)   Patient is on Vitamin D supplement.   Lab Results   Component Value Date   VD25OH 16 07/19/2017     She is on thyroid medication. Her medication was not changed last visit.   Lab Results  Component Value Date   TSH 0.71 07/19/2017     Current Medications:  Current Outpatient Medications on File Prior to Visit  Medication Sig Dispense Refill  . aspirin 81 MG tablet Take 81 mg by mouth daily.    Marland Kitchen CALCIUM PO Take 1 tablet by mouth daily.    . Cholecalciferol (VITAMIN D) 2000 UNITS tablet Take 2,000 Units by mouth 3 (three) times daily.    . fish oil-omega-3 fatty acids 1000 MG capsule Take 2 g by mouth daily.    Marland Kitchen levothyroxine (SYNTHROID, LEVOTHROID) 50 MCG tablet Take 1 tablet 1st in the morning with only water & no other meds or food for 30 minutes 90 tablet 1  . magnesium 30 MG tablet Take 30 mg by mouth 2 (two) times daily.    . Multiple Vitamin (MULTIVITAMIN) tablet Take 1 tablet by mouth daily.    . Red Yeast Rice 600 MG CAPS Take 600 mg by mouth daily.     . vitamin C (ASCORBIC ACID) 500 MG tablet Take 500 mg by mouth daily.     No current facility-administered medications on file prior to visit.    Medical History:  Past Medical History:  Diagnosis Date  . Breast cancer ([place]) 08/2011   right  . Diverticulosis   . Hypothyroidism   . Labile hypertension   .  Prediabetes   . Thoracic aorta atherosclerosis ([place])    via CXR  . Vitamin D deficiency    Allergies: No Known Allergies   Review of Systems:  Review of Systems  Constitutional: Negative.   HENT: Negative.   Eyes: Negative.   Respiratory: Negative.  Negative for shortness of breath.   Cardiovascular: Negative.  Negative for chest pain.  Gastrointestinal: Negative.   Genitourinary: Negative.   Musculoskeletal: Negative.  Negative for falls.  Skin: Negative.   Neurological: Negative.   Endo/Heme/Allergies: Negative.   Psychiatric/Behavioral: Negative.  Negative for depression.    Family history- Review and unchanged Social history- Review and  unchanged Physical Exam: BP 138/60   Pulse 67   Temp 97.6 F (36.4 C)   Resp 16   Ht 5' (1.524 m)   Wt 155 lb 12.8 oz (70.7 kg)   SpO2 99%   BMI 30.43 kg/m  Wt Readings from Last 3 Encounters:  10/30/17 155 lb 12.8 oz (70.7 kg)  07/19/17 156 lb (70.8 kg)  04/17/17 158 lb (71.7 kg)   General Appearance: Well nourished, in no apparent distress. Eyes: PERRLA, EOMs, conjunctiva no swelling or erythema Sinuses: No Frontal/maxillary tenderness ENT/Mouth: Ext aud canals clear, TMs without erythema, bulging. No erythema, swelling, or exudate on post pharynx.  Tonsils not swollen or erythematous. Hearing normal.  Neck: Supple, thyroid goiter unchanged  Respiratory: Respiratory effort normal, BS equal bilaterally without rales, rhonchi, wheezing or stridor.  Cardio: RRR with no MRGs. Brisk peripheral pulses without edema.  Abdomen: Soft, + BS,  Non tender, no guarding, rebound, hernias, masses. Lymphatics: Non tender without lymphadenopathy.  Musculoskeletal: Full ROM, 5/5 strength, Normal gait Skin: Warm, dry without rashes, lesions, ecchymosis.  Neuro: Cranial nerves intact. Normal muscle tone, no cerebellar symptoms. Psych: Awake and oriented X 3, normal affect, Insight and Judgment appropriate.    Vicie Mutters, PA-C 10:38 AM [facility]

## 2017-10-31 LAB — COMPLETE METABOLIC PANEL WITH GFR
AG Ratio: 1.1 (calc) (ref 1.0–2.5)
ALBUMIN MSPROF: 3.7 g/dL (ref 3.6–5.1)
ALT: 9 U/L (ref 6–29)
AST: 27 U/L (ref 10–35)
Alkaline phosphatase (APISO): 64 U/L (ref 33–130)
BUN: 21 mg/dL (ref 7–25)
CO2: 29 mmol/L (ref 20–32)
CREATININE: 0.83 mg/dL (ref 0.60–0.88)
Calcium: 9.5 mg/dL (ref 8.6–10.4)
Chloride: 101 mmol/L (ref 98–110)
GFR, EST AFRICAN AMERICAN: 77 mL/min/{1.73_m2} (ref >=60)
GFR, EST NON AFRICAN AMERICAN: 66 mL/min/{1.73_m2} (ref >=60)
Globulin: 3.4 g/dL (calc) (ref 1.9–3.7)
Glucose, Bld: 87 mg/dL (ref 65–99)
Potassium: 4.4 mmol/L (ref 3.5–5.3)
SODIUM: 137 mmol/L (ref 135–146)
Total Bilirubin: 0.4 mg/dL (ref 0.2–1.2)
Total Protein: 7.1 g/dL (ref 6.1–8.1)

## 2017-10-31 LAB — CBC WITH DIFFERENTIAL/PLATELET
BASOS PCT: 0.5 %
Basophils Absolute: 32 cells/uL (ref 0–200)
EOS PCT: 2.7 %
Eosinophils Absolute: 173 cells/uL (ref 15–500)
HCT: 37.2 % (ref 35.0–45.0)
Hemoglobin: 12.3 g/dL (ref 11.7–15.5)
Lymphs Abs: 2176 cells/uL (ref 850–3900)
MCH: 27.6 pg (ref 27.0–33.0)
MCHC: 33.1 g/dL (ref 32.0–36.0)
MCV: 83.4 fL (ref 80.0–100.0)
MONOS PCT: 13.3 %
MPV: 12.3 fL (ref 7.5–12.5)
NEUTROS PCT: 49.5 %
Neutro Abs: 3168 cells/uL (ref 1500–7800)
PLATELETS: 160 10*3/uL (ref 140–400)
RBC: 4.46 10*6/uL (ref 3.80–5.10)
RDW: 13.4 % (ref 11.0–15.0)
TOTAL LYMPHOCYTE: 34 %
WBC: 6.4 10*3/uL (ref 3.8–10.8)
WBCMIX: 851 {cells}/uL (ref 200–950)

## 2017-10-31 LAB — HEMOGLOBIN A1C
EAG (MMOL/L): 7.3 (calc)
Hgb A1c MFr Bld: 6.2 % of total Hgb — ABNORMAL HIGH (ref <5.7)
MEAN PLASMA GLUCOSE: 131 (calc)

## 2017-10-31 LAB — LIPID PANEL
CHOL/HDL RATIO: 2.5 (calc) (ref <5.0)
Cholesterol: 197 mg/dL (ref <200)
HDL: 79 mg/dL (ref >50)
LDL CHOLESTEROL (CALC): 103 mg/dL — AB
NON-HDL CHOLESTEROL (CALC): 118 mg/dL (ref <130)
Triglycerides: 67 mg/dL (ref <150)

## 2017-10-31 LAB — TSH: TSH: 0.42 m[IU]/L (ref 0.40–4.50)

## 2018-02-07 ENCOUNTER — Encounter: Payer: Self-pay | Admitting: Internal Medicine

## 2018-02-07 ENCOUNTER — Ambulatory Visit: Payer: Medicare Other | Admitting: Internal Medicine

## 2018-02-07 VITALS — BP 140/78 | HR 67 | Temp 98.4°F | Ht 60.0 in | Wt 156.4 lb

## 2018-02-07 DIAGNOSIS — R7309 Other abnormal glucose: Secondary | ICD-10-CM

## 2018-02-07 DIAGNOSIS — E782 Mixed hyperlipidemia: Secondary | ICD-10-CM

## 2018-02-07 DIAGNOSIS — I10 Essential (primary) hypertension: Secondary | ICD-10-CM

## 2018-02-07 DIAGNOSIS — J302 Other seasonal allergic rhinitis: Secondary | ICD-10-CM

## 2018-02-07 DIAGNOSIS — Z136 Encounter for screening for cardiovascular disorders: Secondary | ICD-10-CM | POA: Diagnosis not present

## 2018-02-07 DIAGNOSIS — E039 Hypothyroidism, unspecified: Secondary | ICD-10-CM

## 2018-02-07 DIAGNOSIS — R7303 Prediabetes: Secondary | ICD-10-CM

## 2018-02-07 DIAGNOSIS — Z0001 Encounter for general adult medical examination with abnormal findings: Secondary | ICD-10-CM

## 2018-02-07 DIAGNOSIS — Z1212 Encounter for screening for malignant neoplasm of rectum: Secondary | ICD-10-CM

## 2018-02-07 DIAGNOSIS — Z0000 Encounter for general adult medical examination without abnormal findings: Secondary | ICD-10-CM | POA: Diagnosis not present

## 2018-02-07 DIAGNOSIS — E559 Vitamin D deficiency, unspecified: Secondary | ICD-10-CM

## 2018-02-07 DIAGNOSIS — R0989 Other specified symptoms and signs involving the circulatory and respiratory systems: Secondary | ICD-10-CM

## 2018-02-07 DIAGNOSIS — Z853 Personal history of malignant neoplasm of breast: Secondary | ICD-10-CM

## 2018-02-07 DIAGNOSIS — Z1211 Encounter for screening for malignant neoplasm of colon: Secondary | ICD-10-CM

## 2018-02-07 DIAGNOSIS — Z79899 Other long term (current) drug therapy: Secondary | ICD-10-CM

## 2018-02-07 NOTE — Patient Instructions (Signed)

## 2018-02-07 NOTE — Progress Notes (Signed)
[facility] [name], M.D.     Uvaldo Bristle. Silverio Lay, P.A.-C Liane Comber, [place], [place] [address] Telephone [phone] Telefax [phone] Annual Screening/Preventative Visit & Comprehensive Evaluation &  Examination     This very nice 84 y.o. MBF presents for a Screening /Preventative Visit & comprehensive evaluation and management of multiple medical co-morbidities.  Patient has been followed for HTN, HLD, Prediabetes  and Vitamin D Deficiency.      Patient is followed by Dr Lindi Adie for hx/o Rt Breast Cancer in 2013.     Labile HTN predates since 2000. Patient's BP has been controlled at home and patient denies any cardiac symptoms as chest pain, palpitations, shortness of breath, dizziness or ankle swelling. Today's BP is at goal - 140/78.      Patient's hyperlipidemia is usually controlled with diet and medications. Patient denies myalgias or other medication SE's. Last lipids were not at goal; Lab Results  Component Value Date   CHOL 197 10/30/2017   HDL 79 10/30/2017   LDLCALC 103 (H) 10/30/2017   TRIG 67 10/30/2017   CHOLHDL 2.5 10/30/2017      Patient has hx/o prediabetes (A1c 6.1% / 2011 & 6.4% / 2012) and patient denies reactive hypoglycemic symptoms, visual blurring, diabetic polys or paresthesias. Last A1c was not at goal: Lab Results  Component Value Date   HGBA1C 6.2 (H) 10/30/2017      Patient has hx/o Goiter / hypothyroidism and has been on replacement Thyroid since 2011.      Finally, patient has history of Vitamin D Deficiency and last Vitamin D was at goal: Lab Results  Component Value Date   VD25OH 17 07/19/2017   Current Outpatient Medications on File Prior to Visit  Medication Sig  . aspirin 81 MG tablet Take 81 mg by mouth daily.  Marland Kitchen CALCIUM PO Take 1 tablet by mouth daily.  . Cholecalciferol (VITAMIN D) 2000 UNITS tablet Take 2,000 Units by  mouth 3 (three) times daily.  . fish oil-omega-3 fatty acids 1000 MG capsule Take 2 g by mouth daily.  Marland Kitchen levothyroxine (SYNTHROID, LEVOTHROID) 50 MCG tablet Take 1 tablet 1st in the morning with only water & no other meds or food for 30 minutes  . loratadine (CLARITIN) 10 MG tablet Take 10 mg by mouth daily.  . magnesium 30 MG tablet Take 30 mg by mouth 2 (two) times daily.  . Multiple Vitamin (MULTIVITAMIN) tablet Take 1 tablet by mouth daily.  . Red Yeast Rice 600 MG CAPS Take 600 mg by mouth daily.   . vitamin C (ASCORBIC ACID) 500 MG tablet Take 500 mg by mouth daily.   No current facility-administered medications on file prior to visit.    No Known Allergies Past Medical History:  Diagnosis Date  . Breast cancer ([place]) 08/2011   right  . Diverticulosis   . Hypothyroidism   . Labile hypertension   . Prediabetes   . Thoracic aorta atherosclerosis ([place])    via CXR  . Vitamin D deficiency    Health Maintenance  Topic Date Due  . INFLUENZA VACCINE  08/08/2017  . TETANUS/TDAP  05/19/2018  . MAMMOGRAM  07/26/2018  . DEXA SCAN  Completed  . PNA vac Low Risk Adult  Completed   Immunization History  Administered Date(s) Administered  . Pneumococcal Conjugate-13 03/01/2014  . Pneumococcal Polysaccharide-23 01/09/2007  . Td 05/18/2008   Last Colon - 08/18/2012 - Dr Sharlett Iles -  hyperplastic polyp - recc 10 yr f/u (& patient would be 84 yo )   Last MGM - 07/25/2017  Past Surgical History:  Procedure Laterality Date  . BREAST SURGERY     right breast  . COLONOSCOPY    . MASTECTOMY COMPLETE / SIMPLE W/ SENTINEL NODE BIOPSY  08/10/11   right  . MASTECTOMY W/ SENTINEL NODE BIOPSY  08/10/2011   Procedure: MASTECTOMY WITH SENTINEL LYMPH NODE BIOPSY;  Surgeon: Harl Bowie, MD;  Location: [facility];  Service: General;  Laterality: Right;   Family History  Problem Relation Age of Onset  . Colon cancer Mother 49  . Prostate cancer Father   . Lung cancer Brother   . Lung cancer  Sister   . Kidney cancer Brother    Social History   Tobacco Use  . Smoking status: Never Smoker  . Smokeless tobacco: Never Used  Substance Use Topics  . Alcohol use: No  . Drug use: No    ROS Constitutional: Denies fever, chills, weight loss/gain, headaches, insomnia,  night sweats, and change in appetite. Does c/o fatigue. Eyes: Denies redness, blurred vision, diplopia, discharge, itchy, watery eyes.  ENT: Denies discharge, congestion, post nasal drip, epistaxis, sore throat, earache, hearing loss, dental pain, Tinnitus, Vertigo, Sinus pain, snoring.  Cardio: Denies chest pain, palpitations, irregular heartbeat, syncope, dyspnea, diaphoresis, orthopnea, PND, claudication, edema Respiratory: denies cough, dyspnea, DOE, pleurisy, hoarseness, laryngitis, wheezing.  Gastrointestinal: Denies dysphagia, heartburn, reflux, water brash, pain, cramps, nausea, vomiting, bloating, diarrhea, constipation, hematemesis, melena, hematochezia, jaundice, hemorrhoids Genitourinary: Denies dysuria, frequency, urgency, nocturia, hesitancy, discharge, hematuria, flank pain Breast: Breast lumps, nipple discharge, bleeding.  Musculoskeletal: Denies arthralgia, myalgia, stiffness, Jt. Swelling, pain, limp, and strain/sprain. Denies falls. Skin: Denies puritis, rash, hives, warts, acne, eczema, changing in skin lesion Neuro: No weakness, tremor, incoordination, spasms, paresthesia, pain Psychiatric: Denies confusion, memory loss, sensory loss. Denies Depression. Endocrine: Denies change in weight, skin, hair change, nocturia, and paresthesia, diabetic polys, visual blurring, hyper / hypo glycemic episodes.  Heme/Lymph: No excessive bleeding, bruising, enlarged lymph nodes.  Physical Exam  BP 140/78   Pulse 67   Temp 98.4 F (36.9 C)   Ht 5' (1.524 m)   Wt 156 lb 6.4 oz (70.9 kg)   SpO2 99%   BMI 30.54 kg/m   General Appearance: Well nourished, well groomed and in no apparent distress.  Eyes:  PERRLA, EOMs, conjunctiva no swelling or erythema, normal fundi and vessels. Sinuses: No frontal/maxillary tenderness ENT/Mouth: EACs patent / TMs  nl. Nares clear without erythema, swelling, mucoid exudates. Oral hygiene is good. No erythema, swelling, or exudate. Tongue normal, non-obstructing. Tonsils not swollen or erythematous. Hearing normal.  Neck: Supple, thyroid not palpable. No bruits, nodes or JVD. Respiratory: Respiratory effort normal.  BS equal and clear bilateral without rales, rhonci, wheezing or stridor. Cardio: Heart sounds are normal with regular rate and rhythm and no murmurs, rubs or gallops. Peripheral pulses are normal and equal bilaterally without edema. No aortic or femoral bruits. Chest: symmetric with normal excursions and percussion. Breasts: Symmetric, without lumps, nipple discharge, retractions, or fibrocystic changes.  Abdomen: Flat, soft with bowel sounds active. Nontender, no guarding, rebound, hernias, masses, or organomegaly.  Lymphatics: Non tender without lymphadenopathy.  Musculoskeletal: Full ROM all peripheral extremities, joint stability, 5/5 strength, and normal gait. Skin: Warm and dry without rashes, lesions, cyanosis, clubbing or  ecchymosis.  Neuro: Cranial nerves intact, reflexes equal bilaterally. Normal muscle tone, no cerebellar symptoms. Sensation intact.  Pysch: Alert and  oriented X 3, normal affect, Insight and Judgment appropriate.   Assessment and Plan  1. Annual Preventative Screening Examination  2. Labile hypertension  - EKG 12-Lead - Microalbumin / creatinine urine ratio - POC Hemoccult Bld/Stl (3-Cd Home Screen); Future - CBC with Differential/Platelet - COMPLETE METABOLIC PANEL WITH GFR - TSH - Urinalysis, Routine w reflex microscopic  3. Hyperlipidemia, mixed  - EKG 12-Lead - Lipid panel - TSH  4. Abnormal glucose  - EKG 12-Lead - Hemoglobin A1c - Insulin, random  5. Vitamin D deficiency  - VITAMIN D 25  Hydroxyl  6. Prediabetes  - EKG 12-Lead - Insulin, random  7. Hypothyroidism  - TSH  8. History of breast cancer  9. Screening for colorectal cancer  - POC Hemoccult Bld/Stl  10. Screening for ischemic heart disease  - EKG 12-Lead  11. Medication management  - Microalbumin / creatinine urine ratio - CBC with Differential/Platelet - COMPLETE METABOLIC PANEL WITH GFR - Magnesium - Lipid panel - TSH - Hemoglobin A1c - Insulin, random - VITAMIN D 25 Hydroxyl - Urinalysis, Routine w reflex microscopic         Patient was counseled in prudent diet to achieve/maintain BMI less than 25 for weight control, BP monitoring, regular exercise and medications. Discussed med's effects and SE's. Screening labs and tests as requested with regular follow-up as recommended. Over 40 minutes of exam, counseling, chart review and high complex critical decision making was performed.

## 2018-02-09 ENCOUNTER — Encounter: Payer: Self-pay | Admitting: Internal Medicine

## 2018-02-09 LAB — COMPLETE METABOLIC PANEL WITH GFR
AG Ratio: 1.2 (calc) (ref 1.0–2.5)
ALBUMIN MSPROF: 4.1 g/dL (ref 3.6–5.1)
ALT: 11 U/L (ref 6–29)
AST: 30 U/L (ref 10–35)
Alkaline phosphatase (APISO): 73 U/L (ref 33–130)
BUN: 24 mg/dL (ref 7–25)
CHLORIDE: 99 mmol/L (ref 98–110)
CO2: 28 mmol/L (ref 20–32)
Calcium: 10 mg/dL (ref 8.6–10.4)
Creat: 0.88 mg/dL (ref 0.60–0.88)
GFR, EST AFRICAN AMERICAN: 71 mL/min/{1.73_m2} (ref >=60)
GFR, Est Non African American: 62 mL/min/{1.73_m2} (ref >=60)
GLUCOSE: 93 mg/dL (ref 65–99)
Globulin: 3.4 g/dL (calc) (ref 1.9–3.7)
Potassium: 4.4 mmol/L (ref 3.5–5.3)
Sodium: 135 mmol/L (ref 135–146)
Total Bilirubin: 0.4 mg/dL (ref 0.2–1.2)
Total Protein: 7.5 g/dL (ref 6.1–8.1)

## 2018-02-09 LAB — URINALYSIS, ROUTINE W REFLEX MICROSCOPIC
BILIRUBIN URINE: NEGATIVE
Bacteria, UA: NONE SEEN /HPF
Glucose, UA: NEGATIVE
Hgb urine dipstick: NEGATIVE
Hyaline Cast: NONE SEEN /LPF
Ketones, ur: NEGATIVE
Nitrite: NEGATIVE
Protein, ur: NEGATIVE
RBC / HPF: NONE SEEN /HPF (ref 0–2)
Specific Gravity, Urine: 1.012 (ref 1.001–1.03)
pH: 7 (ref 5.0–8.0)

## 2018-02-09 LAB — CBC WITH DIFFERENTIAL/PLATELET
ABSOLUTE MONOCYTES: 983 {cells}/uL — AB (ref 200–950)
Basophils Absolute: 47 cells/uL (ref 0–200)
Basophils Relative: 0.6 %
Eosinophils Absolute: 273 cells/uL (ref 15–500)
Eosinophils Relative: 3.5 %
HCT: 39.1 % (ref 35.0–45.0)
Hemoglobin: 12.9 g/dL (ref 11.7–15.5)
LYMPHS ABS: 2964 {cells}/uL (ref 850–3900)
MCH: 27.6 pg (ref 27.0–33.0)
MCHC: 33 g/dL (ref 32.0–36.0)
MCV: 83.7 fL (ref 80.0–100.0)
MPV: 12.2 fL (ref 7.5–12.5)
Monocytes Relative: 12.6 %
Neutro Abs: 3533 cells/uL (ref 1500–7800)
Neutrophils Relative %: 45.3 %
Platelets: 175 10*3/uL (ref 140–400)
RBC: 4.67 10*6/uL (ref 3.80–5.10)
RDW: 13.2 % (ref 11.0–15.0)
Total Lymphocyte: 38 %
WBC: 7.8 10*3/uL (ref 3.8–10.8)

## 2018-02-09 LAB — INSULIN, RANDOM: Insulin: 8.1 u[IU]/mL (ref 2.0–19.6)

## 2018-02-09 LAB — MICROALBUMIN / CREATININE URINE RATIO
Creatinine, Urine: 42 mg/dL (ref 20–275)
Microalb Creat Ratio: 7 mcg/mg creat (ref <30)
Microalb, Ur: 0.3 mg/dL

## 2018-02-09 LAB — HEMOGLOBIN A1C
Hgb A1c MFr Bld: 5.8 % of total Hgb — ABNORMAL HIGH (ref <5.7)
Mean Plasma Glucose: 120 (calc)
eAG (mmol/L): 6.6 (calc)

## 2018-02-09 LAB — MAGNESIUM: Magnesium: 1.8 mg/dL (ref 1.5–2.5)

## 2018-02-09 LAB — LIPID PANEL
Cholesterol: 195 mg/dL (ref <200)
HDL: 86 mg/dL (ref >50)
LDL Cholesterol (Calc): 94 mg/dL (calc)
NON-HDL CHOLESTEROL (CALC): 109 mg/dL (ref <130)
Total CHOL/HDL Ratio: 2.3 (calc) (ref <5.0)
Triglycerides: 64 mg/dL (ref <150)

## 2018-02-09 LAB — VITAMIN D 25 HYDROXY (VIT D DEFICIENCY, FRACTURES): VIT D 25 HYDROXY: 80 ng/mL (ref 30–100)

## 2018-02-09 LAB — TSH: TSH: 0.47 mIU/L (ref 0.40–4.50)

## 2018-05-15 DIAGNOSIS — E669 Obesity, unspecified: Secondary | ICD-10-CM | POA: Insufficient documentation

## 2018-05-15 NOTE — Progress Notes (Signed)
Virtual Visit via Telephone Note  I connected with Robin Huynh on 05/16/18 at 10:15 AM EDT by telephone and verified that I am speaking with the correct person using two identifiers.  Location: Patient: home Provider: [place] office   I discussed the limitations, risks, security and privacy concerns of performing an evaluation and management service by telephone and the availability of in person appointments. I also discussed with the patient that there may be a patient responsible charge related to this service. The patient expressed understanding and agreed to proceed.   I discussed the assessment and treatment plan with the patient. The patient was provided an opportunity to ask questions and all were answered. The patient agreed with the plan and demonstrated an understanding of the instructions.   The patient was advised to call back or seek an in-person evaluation if the symptoms worsen or if the condition fails to improve as anticipated.  I provided 28 minutes of non-face-to-face time during this encounter.   Izora Ribas, NP    MEDICARE ANNUAL WELLNESS VISIT AND FOLLOW UP  Assessment:   Diagnoses and all orders for this visit:  Encounter for Medicare annual wellness exam  Labile hypertension (2000) At goal today; managed by lifestyle only Monitor blood pressure at home; call if consistently over 130/80 Continue DASH diet.   Reminder to go to the ER if any CP, SOB, nausea, dizziness, severe HA, changes vision/speech, left arm numbness and tingling and jaw pain.  Diverticulosis Increase fiber, bowel management discussed, continue to monitor  Hypothyroidism, unspecified type continue medications the same pending lab results reminded to take on an empty stomach 30-42mins before food.  check TSH level next OV; has been stable   Vitamin D deficiency At goal at recent check; continue to recommend supplementation for goal of 60-100 Defer vitamin D level  Primary  cancer of lower outer quadrant of right female breast [facility]) S/p mastectomy, annual MMGs, has been released by oncology  Prediabetes (06/2009) Discussed disease and risks Discussed diet/exercise, weight management  A1C next visit; has been stable  Medication management CBC, CMP/GFR at routine OV  Hyperlipidemia At goal; continue with red yeast rice supplement Continue low cholesterol diet and exercise.  Check lipid panel biannually  Overweight Long discussion about weight loss, diet, and exercise Recommended diet heavy in fruits and veggies and low in animal meats, cheeses, and dairy products, appropriate calorie intake Patient will work on portions, cutting down on carbs Discussed appropriate weight for height and her goal (<145lb) Follow up at next visit   Over 40 minutes of exam, counseling, chart review and critical decision making was performed Future Appointments  Date Time Provider [place]  08/20/2018 10:30 AM [name], MD [facility] None  03/09/2019 10:00 AM [name], MD [facility] None     Plan:   During the course of the visit the patient was educated and counseled about appropriate screening and preventive services including:    Pneumococcal vaccine   Prevnar 13  Influenza vaccine  Td vaccine  Screening electrocardiogram  Bone densitometry screening  Colorectal cancer screening  Diabetes screening  Glaucoma screening  Nutrition counseling   Advanced directives: requested   Subjective:  Robin Huynh is a 84 y.o. female who presents for Medicare Annual Wellness Visit and 3 month follow up.   She has hx/o R Mastectomy for Rt Breast DCIS (ER+) and is followed by Dr Lindi Adie, is finished up a 5 year course in 2019 of Tamoxifen with no recurrence  by annual mammograms. Has been released.   BMI is Body mass index is 29.29 kg/m., she has been working on diet and exercise. She has been focusing on fruits and vegetables and has  been cutting down on bread. She walks 30-60 min most days.  Wt Readings from Last 3 Encounters:  05/16/18 150 lb (68 kg)  02/07/18 156 lb 6.4 oz (70.9 kg)  10/30/17 155 lb 12.8 oz (70.7 kg)    Her blood pressure has been controlled at home, today their BP is BP: 128/78 She does workout. She denies chest pain, shortness of breath, dizziness.   She is not on cholesterol medication (on red yeast rice supplement only) and denies myalgias. Her cholesterol is at goal. The cholesterol last visit was:   Lab Results  Component Value Date   CHOL 195 02/07/2018   HDL 86 02/07/2018   LDLCALC 94 02/07/2018   TRIG 64 02/07/2018   CHOLHDL 2.3 02/07/2018    She has been working on diet and exercise for prediabetes, and denies foot ulcerations, hyperglycemia, increased appetite, nausea, paresthesia of the feet, polydipsia, polyuria, visual disturbances, vomiting and weight loss. Last A1C in the office was:  Lab Results  Component Value Date   HGBA1C 5.8 (H) 02/07/2018   She is on thyroid medication. Her medication was not changed last visit.   Lab Results  Component Value Date   TSH 0.47 02/07/2018  . Last GFR: Lab Results  Component Value Date   GFRAA 71 02/07/2018   Patient is on Vitamin D supplement and at goal at recent check:    Lab Results  Component Value Date   VD25OH 40 02/07/2018      Medication Review: Current Outpatient Medications on File Prior to Visit  Medication Sig Dispense Refill  . aspirin 81 MG tablet Take 81 mg by mouth daily.    Marland Kitchen CALCIUM PO Take 1 tablet by mouth daily.    . Cholecalciferol (VITAMIN D) 2000 UNITS tablet Take 2,000 Units by mouth 3 (three) times daily.    . fish oil-omega-3 fatty acids 1000 MG capsule Take 2 g by mouth daily.    Marland Kitchen levothyroxine (SYNTHROID, LEVOTHROID) 50 MCG tablet Take 1 tablet 1st in the morning with only water & no other meds or food for 30 minutes 90 tablet 1  . loratadine (CLARITIN) 10 MG tablet Take 10 mg by mouth daily.     . magnesium 30 MG tablet Take 30 mg by mouth 2 (two) times daily.    . Multiple Vitamin (MULTIVITAMIN) tablet Take 1 tablet by mouth daily.    . Red Yeast Rice 600 MG CAPS Take 600 mg by mouth daily.     . vitamin C (ASCORBIC ACID) 500 MG tablet Take 500 mg by mouth daily.     No current facility-administered medications on file prior to visit.     No Known Allergies  Current Problems (verified) Patient Active Problem List   Diagnosis Date Noted  . Obesity (BMI 30.0-34.9) 05/15/2018  . Encounter for Medicare annual wellness exam 10/07/2014  . Hyperlipidemia 07/15/2013  . Medication management 06/30/2013  . Labile hypertension (2000)   . Hypothyroidism   . Other abnormal glucose (prediabetes)   . Vitamin D deficiency   . Diverticulosis   . Primary cancer of lower outer quadrant of right female breast ([place]) 06/21/2011    Screening Tests Immunization History  Administered Date(s) Administered  . Pneumococcal Conjugate-13 03/01/2014  . Pneumococcal Polysaccharide-23 01/09/2007  . Td 05/18/2008  Preventative care: Last colonoscopy: 2014 wants one last follow up - cologuard ordered - never polyps Last mammogram: 07/2017, patient will call to schedule DEXA: 07/07/14 - normal - wants to defer PAP 2012 US thyroid with bx 02/2015 negative  Prior vaccinations: TD or Tdap: 2010 defer  Influenza: Declines Pneumococcal: 2009 Prevnar13: 2010 Shingles/Zostavax: 2010  Names of Other Physician/Practitioners you currently use: 1. [facility]- here for primary care 2. [facility], eye doctor, last visit 2019, goes q65m - has left cataract - surgery postponed due to virus but doing well  3. Dr.  Toy Cookey, dentist, last visit 2019, goes q79m  Patient Care Team: [name], MD as PCP - General (Internal Medicine) Nicholas Lose, MD as Consulting Physician (Hematology and Oncology) Sable Feil, MD as Consulting Physician  (Gastroenterology) Coralie Keens, MD as Consulting Physician (General Surgery)  SURGICAL HISTORY She  has a past surgical history that includes Breast surgery; Colonoscopy; Mastectomy complete / simple w/ sentinel node biopsy (08/10/11); and Mastectomy w/ sentinel node biopsy (08/10/2011). FAMILY HISTORY Her family history includes Colon cancer (age of onset: 28) in her mother; Kidney cancer in her brother; Lung cancer in her brother and sister; Prostate cancer in her father. SOCIAL HISTORY She  reports that she has never smoked. She has never used smokeless tobacco. She reports that she does not drink alcohol or use drugs.   MEDICARE WELLNESS OBJECTIVES: Physical activity:   Cardiac risk factors:   Depression/mood screen:   Depression screen [facility] 2/9 05/16/2018  Decreased Interest 0  Down, Depressed, Hopeless 0  PHQ - 2 Score 0    ADLs:  In your present state of health, do you have any difficulty performing the following activities: 02/07/2018 07/20/2017  Hearing? N N  Vision? N N  Difficulty concentrating or making decisions? N N  Walking or climbing stairs? N N  Dressing or bathing? N N  Doing errands, shopping? N N  Some recent data might be hidden     Cognitive Testing  Alert? Yes  Normal Appearance? n/a  Oriented to person? Yes  Place? Yes   Time? Yes  Recall of three objects?  Yes  Can perform simple calculations? Yes  Displays appropriate judgment?Yes  Can read the correct time from a watch face? n/a  EOL planning: Does Patient Have a Medical Advance Directive?: Yes Type of Advance Directive: Healthcare Power of Attorney, Living will Does patient want to make changes to medical advance directive?: No - Patient declined Copy of [place] in Chart?: No - copy requested  Review of Systems  Constitutional: Negative for malaise/fatigue and weight loss.  HENT: Negative for hearing loss and tinnitus.   Eyes: Negative for blurred vision and double vision.   Respiratory: Negative for cough, sputum production, shortness of breath and wheezing.   Cardiovascular: Negative for chest pain, palpitations, orthopnea, claudication, leg swelling and PND.  Gastrointestinal: Negative for abdominal pain, blood in stool, constipation, diarrhea, heartburn, melena, nausea and vomiting.  Genitourinary: Negative.   Musculoskeletal: Negative for falls, joint pain and myalgias.  Skin: Negative for rash.  Neurological: Negative for dizziness, tingling, sensory change, weakness and headaches.  Endo/Heme/Allergies: Negative for polydipsia.  Psychiatric/Behavioral: Negative.  Negative for depression, memory loss, substance abuse and suicidal ideas. The patient is not nervous/anxious and does not have insomnia.   All other systems reviewed and are negative.    Objective:     Today's Vitals   05/16/18 1011  BP: 128/78  Pulse: 65  Temp: 97.6 F (36.4 C)  Weight: 150 lb (68 kg)   Body mass index is 29.29 kg/m.  General : Well sounding patient in no apparent distress HEENT: no hoarseness, no cough for duration of visit Lungs: speaks in complete sentences, no audible wheezing, no apparent distress Neurological: alert, oriented x 3 Psychiatric: pleasant, judgement appropriate   Medicare Attestation I have personally reviewed: The patient's medical and social history Their use of alcohol, tobacco or illicit drugs Their current medications and supplements The patient's functional ability including ADLs,fall risks, home safety risks, cognitive, and hearing and visual impairment Diet and physical activities Evidence for depression or mood disorders  The patient's weight, height, BMI, and visual acuity have been recorded in the chart.  I have made referrals, counseling, and provided education to the patient based on review of the above and I have provided the patient with a written personalized care plan for preventive services.     Izora Ribas,  NP   05/16/2018

## 2018-05-16 ENCOUNTER — Encounter: Payer: Self-pay | Admitting: Adult Health

## 2018-05-16 ENCOUNTER — Ambulatory Visit: Payer: Self-pay | Admitting: Adult Health

## 2018-05-16 ENCOUNTER — Other Ambulatory Visit: Payer: Self-pay

## 2018-05-16 ENCOUNTER — Ambulatory Visit: Payer: Medicare Other | Admitting: Adult Health

## 2018-05-16 VITALS — BP 128/78 | HR 65 | Temp 97.6°F | Wt 150.0 lb

## 2018-05-16 DIAGNOSIS — R7309 Other abnormal glucose: Secondary | ICD-10-CM

## 2018-05-16 DIAGNOSIS — K5790 Diverticulosis of intestine, part unspecified, without perforation or abscess without bleeding: Secondary | ICD-10-CM

## 2018-05-16 DIAGNOSIS — E559 Vitamin D deficiency, unspecified: Secondary | ICD-10-CM | POA: Diagnosis not present

## 2018-05-16 DIAGNOSIS — E039 Hypothyroidism, unspecified: Secondary | ICD-10-CM | POA: Diagnosis not present

## 2018-05-16 DIAGNOSIS — E669 Obesity, unspecified: Secondary | ICD-10-CM

## 2018-05-16 DIAGNOSIS — Z0001 Encounter for general adult medical examination with abnormal findings: Secondary | ICD-10-CM

## 2018-05-16 DIAGNOSIS — R6889 Other general symptoms and signs: Secondary | ICD-10-CM | POA: Diagnosis not present

## 2018-05-16 DIAGNOSIS — Z0000 Encounter for general adult medical examination without abnormal findings: Secondary | ICD-10-CM

## 2018-05-16 DIAGNOSIS — C50511 Malignant neoplasm of lower-outer quadrant of right female breast: Secondary | ICD-10-CM | POA: Diagnosis not present

## 2018-05-16 DIAGNOSIS — Z1211 Encounter for screening for malignant neoplasm of colon: Secondary | ICD-10-CM

## 2018-05-16 DIAGNOSIS — E782 Mixed hyperlipidemia: Secondary | ICD-10-CM

## 2018-05-16 DIAGNOSIS — Z79899 Other long term (current) drug therapy: Secondary | ICD-10-CM

## 2018-05-16 DIAGNOSIS — R0989 Other specified symptoms and signs involving the circulatory and respiratory systems: Secondary | ICD-10-CM

## 2018-05-28 LAB — COLOGUARD: Cologuard: NEGATIVE

## 2018-06-03 ENCOUNTER — Encounter: Payer: Self-pay | Admitting: Adult Health

## 2018-06-23 ENCOUNTER — Other Ambulatory Visit: Payer: Self-pay | Admitting: Physician Assistant

## 2018-08-08 LAB — HM MAMMOGRAPHY

## 2018-08-19 ENCOUNTER — Encounter: Payer: Self-pay | Admitting: Internal Medicine

## 2018-08-19 NOTE — Patient Instructions (Signed)

## 2018-08-19 NOTE — Progress Notes (Signed)
History of Present Illness:      This very nice 84 y.o. MBF presents for 6 month follow up with HTN, HLD, Pre-Diabetes, Hypothyroidism and Vitamin D Deficiency.       Patient is followed for labile  HTN since 2000  & BP has been controlled at home. Todays BP is at goal - 116/80. Patient has had no complaints of any cardiac type chest pain, palpitations, dyspnea / orthopnea / PND, dizziness, claudication, or dependent edema.      Hyperlipidemia is controlled with diet & meds. Patient denies myalgias or other med SEs. Last Lipids were at goal: Lab Results  Component Value Date   CHOL 195 02/07/2018   HDL 86 02/07/2018   LDLCALC 94 02/07/2018   TRIG 64 02/07/2018   CHOLHDL 2.3 02/07/2018       Also, the patient has history of PreDiabetes (A1c 6.1% / 2011 & 6.4% / 2012)  and has had no symptoms of reactive hypoglycemia, diabetic polys, paresthesias or visual blurring.  Last A1c was not at goal: Lab Results  Component Value Date   HGBA1C 5.8 (H) 02/07/2018        Patient hashas been on replacement Thyroid for hx/o Goiter / hypothyroidism since 2011.         Further, the patient also has history of Vitamin D Deficiency and supplements vitamin D without any suspected side-effects. Last vitamin D was at goal: Lab Results  Component Value Date   VD25OH 37 02/07/2018    Current Outpatient Medications on File Prior to Visit  Medication Sig   aspirin 81 MG tablet Take 81 mg by mouth daily.   CALCIUM PO Take 1 tablet by mouth daily.   Cholecalciferol (VITAMIN D) 2000 UNITS tablet Take 2,000 Units by mouth 3 (three) times daily.   fish oil-omega-3 fatty acids 1000 MG capsule Take 2 g by mouth daily.   levothyroxine (SYNTHROID) 50 MCG tablet Take 1 tablet daily on an empty stomach with only water for 30 minutes & no Antacid meds, Calcium or Magnesium for 4 hours & avoid Biotin   loratadine (CLARITIN) 10 MG tablet Take 10 mg by mouth daily.   magnesium 30 MG tablet Take 30 mg by  mouth 2 (two) times daily.   Multiple Vitamin (MULTIVITAMIN) tablet Take 1 tablet by mouth daily.   Red Yeast Rice 600 MG CAPS Take 600 mg by mouth daily.    vitamin C (ASCORBIC ACID) 500 MG tablet Take 500 mg by mouth daily.   No current facility-administered medications on file prior to visit.     No Known Allergies  PMHx:   Past Medical History:  Diagnosis Date   Breast cancer ([place]) 08/2011   right   Diverticulosis    Hypothyroidism    Labile hypertension    Prediabetes    Thoracic aorta atherosclerosis ([place])    via CXR   Vitamin D deficiency    Immunization History  Administered Date(s) Administered   Pneumococcal Conjugate-13 03/01/2014   Pneumococcal Polysaccharide-23 01/09/2007   Td 05/18/2008   Past Surgical History:  Procedure Laterality Date   BREAST SURGERY     right breast   COLONOSCOPY     MASTECTOMY COMPLETE / SIMPLE W/ SENTINEL NODE BIOPSY  08/10/11   right   MASTECTOMY W/ SENTINEL NODE BIOPSY  08/10/2011   Procedure: MASTECTOMY WITH SENTINEL LYMPH NODE BIOPSY;  Surgeon: Harl Bowie, MD;  Location: [facility];  Service: General;  Laterality: Right;  FHx:    Reviewed / unchanged  SHx:    Reviewed / unchanged   Systems Review:  Constitutional: Denies fever, chills, wt changes, headaches, insomnia, fatigue, night sweats, change in appetite. Eyes: Denies redness, blurred vision, diplopia, discharge, itchy, watery eyes.  ENT: Denies discharge, congestion, post nasal drip, epistaxis, sore throat, earache, hearing loss, dental pain, tinnitus, vertigo, sinus pain, snoring.  CV: Denies chest pain, palpitations, irregular heartbeat, syncope, dyspnea, diaphoresis, orthopnea, PND, claudication or edema. Respiratory: denies cough, dyspnea, DOE, pleurisy, hoarseness, laryngitis, wheezing.  Gastrointestinal: Denies dysphagia, odynophagia, heartburn, reflux, water brash, abdominal pain or cramps, nausea, vomiting, bloating, diarrhea, constipation,  hematemesis, melena, hematochezia  or hemorrhoids. Genitourinary: Denies dysuria, frequency, urgency, nocturia, hesitancy, discharge, hematuria or flank pain. Musculoskeletal: Denies arthralgias, myalgias, stiffness, jt. swelling, pain, limping or strain/sprain.  Skin: Denies pruritus, rash, hives, warts, acne, eczema or change in skin lesion(s). Neuro: No weakness, tremor, incoordination, spasms, paresthesia or pain. Psychiatric: Denies confusion, memory loss or sensory loss. Endo: Denies change in weight, skin or hair change.  Heme/Lymph: No excessive bleeding, bruising or enlarged lymph nodes.  Physical Exam  BP 116/80    Pulse 64    Temp (!) 97 F (36.1 C)    Resp 16    Ht 5' (1.524 m)    Wt 157 lb 6.4 oz (71.4 kg)    BMI 30.74 kg/m   Appears  well nourished, well groomed  and in no distress.  Eyes: PERRLA, EOMs, conjunctiva no swelling or erythema. Sinuses: No frontal/maxillary tenderness ENT/Mouth: EAC's clear, TM's nl w/o erythema, bulging. Nares clear w/o erythema, swelling, exudates. Oropharynx clear without erythema or exudates. Oral hygiene is good. Tongue normal, non obstructing. Hearing intact.  Neck: Supple. Thyroid not palpable. Car 2+/2+ without bruits, nodes or JVD. Chest: Respirations nl with BS clear & equal w/o rales, rhonchi, wheezing or stridor.  Cor: Heart sounds normal w/ regular rate and rhythm without sig. murmurs, gallops, clicks or rubs. Peripheral pulses normal and equal  without edema.  Abdomen: Soft & bowel sounds normal. Non-tender w/o guarding, rebound, hernias, masses or organomegaly.  Lymphatics: Unremarkable.  Musculoskeletal: Full ROM all peripheral extremities, joint stability, 5/5 strength and normal gait.  Skin: Warm, dry without exposed rashes, lesions or ecchymosis apparent.  Neuro: Cranial nerves intact, reflexes equal bilaterally. Sensory-motor testing grossly intact. Tendon reflexes grossly intact.  Pysch: Alert & oriented x 3.  Insight and  judgement nl & appropriate. No ideations.  Assessment and Plan:  1. Labile hypertension (2000)  - Continue medication, monitor blood pressure at home.  - Continue DASH diet.  Reminder to go to the ER if any CP,  SOB, nausea, dizziness, severe HA, changes vision/speech.  - CBC with Differential/Platelet - COMPLETE METABOLIC PANEL WITH GFR - Magnesium - TSH  2. Hyperlipidemia, mixed  - Continue diet/meds, exercise,& lifestyle modifications.  - Continue monitor periodic cholesterol/liver & renal functions   - Lipid panel - TSH  3. Abnormal glucose  - Continue diet, exercise  - Lifestyle modifications.  - Monitor appropriate labs.  - Hemoglobin A1c - Insulin, random  4. Vitamin D deficiency  - Continue supplementation.  - VITAMIN D 25 Hydroxyl  5. Prediabetes  - Hemoglobin A1c - Insulin, random  6. Hypothyroidism  - TSH  7. Medication management  - CBC with Differential/Platelet - COMPLETE METABOLIC PANEL WITH GFR - Magnesium - Lipid panel - TSH - Hemoglobin A1c - Insulin, random - VITAMIN D 25 Hydroxyl  Discussed  regular exercise, BP monitoring, weight control to achieve/maintain BMI less than 25 and discussed med and SE's. Recommended labs to assess and monitor clinical status with further disposition pending results of labs.  I discussed the assessment and treatment plan with the patient. The patient was provided an opportunity to ask questions and all were answered. The patient agreed with the plan and demonstrated an understanding of the instructions.  I provided over 30 minutes of exam, counseling, chart review and  complex critical decision making.         The patient was advised to call back or seek an in-person evaluation if the symptoms worsen or if the condition fails to improve as anticipated.   Kirtland Bouchard, MD

## 2018-08-20 ENCOUNTER — Other Ambulatory Visit: Payer: Self-pay

## 2018-08-20 ENCOUNTER — Ambulatory Visit: Payer: Medicare Other | Admitting: Internal Medicine

## 2018-08-20 VITALS — BP 116/80 | HR 64 | Temp 97.0°F | Resp 16 | Ht 60.0 in | Wt 157.4 lb

## 2018-08-20 DIAGNOSIS — R0989 Other specified symptoms and signs involving the circulatory and respiratory systems: Secondary | ICD-10-CM | POA: Diagnosis not present

## 2018-08-20 DIAGNOSIS — E039 Hypothyroidism, unspecified: Secondary | ICD-10-CM

## 2018-08-20 DIAGNOSIS — E559 Vitamin D deficiency, unspecified: Secondary | ICD-10-CM

## 2018-08-20 DIAGNOSIS — E782 Mixed hyperlipidemia: Secondary | ICD-10-CM | POA: Diagnosis not present

## 2018-08-20 DIAGNOSIS — Z79899 Other long term (current) drug therapy: Secondary | ICD-10-CM

## 2018-08-20 DIAGNOSIS — R7303 Prediabetes: Secondary | ICD-10-CM

## 2018-08-20 DIAGNOSIS — R7309 Other abnormal glucose: Secondary | ICD-10-CM

## 2018-08-21 LAB — COMPLETE METABOLIC PANEL WITH GFR
AG Ratio: 1.2 (calc) (ref 1.0–2.5)
ALT: 7 U/L (ref 6–29)
AST: 24 U/L (ref 10–35)
Albumin: 3.8 g/dL (ref 3.6–5.1)
Alkaline phosphatase (APISO): 73 U/L (ref 37–153)
BUN: 19 mg/dL (ref 7–25)
CO2: 26 mmol/L (ref 20–32)
Calcium: 9.6 mg/dL (ref 8.6–10.4)
Chloride: 100 mmol/L (ref 98–110)
Creat: 0.84 mg/dL (ref 0.60–0.88)
GFR, Est African American: 75 mL/min/{1.73_m2} (ref >=60)
GFR, Est Non African American: 65 mL/min/{1.73_m2} (ref >=60)
Globulin: 3.2 g/dL (calc) (ref 1.9–3.7)
Glucose, Bld: 102 mg/dL — ABNORMAL HIGH (ref 65–99)
Potassium: 4.2 mmol/L (ref 3.5–5.3)
Sodium: 135 mmol/L (ref 135–146)
Total Bilirubin: 0.4 mg/dL (ref 0.2–1.2)
Total Protein: 7 g/dL (ref 6.1–8.1)

## 2018-08-21 LAB — LIPID PANEL
Cholesterol: 205 mg/dL — ABNORMAL HIGH (ref <200)
HDL: 77 mg/dL (ref >=50)
LDL Cholesterol (Calc): 113 mg/dL (calc) — ABNORMAL HIGH
Non-HDL Cholesterol (Calc): 128 mg/dL (calc) (ref <130)
Total CHOL/HDL Ratio: 2.7 (calc) (ref <5.0)
Triglycerides: 63 mg/dL (ref <150)

## 2018-08-21 LAB — MAGNESIUM: Magnesium: 1.7 mg/dL (ref 1.5–2.5)

## 2018-08-21 LAB — TSH: TSH: 0.26 mIU/L — ABNORMAL LOW (ref 0.40–4.50)

## 2018-08-21 LAB — CBC WITH DIFFERENTIAL/PLATELET
Absolute Monocytes: 756 cells/uL (ref 200–950)
Basophils Absolute: 31 cells/uL (ref 0–200)
Basophils Relative: 0.5 %
Eosinophils Absolute: 149 cells/uL (ref 15–500)
Eosinophils Relative: 2.4 %
HCT: 39 % (ref 35.0–45.0)
Hemoglobin: 12.8 g/dL (ref 11.7–15.5)
Lymphs Abs: 2220 cells/uL (ref 850–3900)
MCH: 27.3 pg (ref 27.0–33.0)
MCHC: 32.8 g/dL (ref 32.0–36.0)
MCV: 83.2 fL (ref 80.0–100.0)
MPV: 11.5 fL (ref 7.5–12.5)
Monocytes Relative: 12.2 %
Neutro Abs: 3044 cells/uL (ref 1500–7800)
Neutrophils Relative %: 49.1 %
Platelets: 168 10*3/uL (ref 140–400)
RBC: 4.69 10*6/uL (ref 3.80–5.10)
RDW: 13.7 % (ref 11.0–15.0)
Total Lymphocyte: 35.8 %
WBC: 6.2 10*3/uL (ref 3.8–10.8)

## 2018-08-21 LAB — VITAMIN D 25 HYDROXY (VIT D DEFICIENCY, FRACTURES): Vit D, 25-Hydroxy: 84 ng/mL (ref 30–100)

## 2018-08-21 LAB — HEMOGLOBIN A1C
Hgb A1c MFr Bld: 6 % of total Hgb — ABNORMAL HIGH (ref <5.7)
Mean Plasma Glucose: 126 (calc)
eAG (mmol/L): 7 (calc)

## 2018-08-21 LAB — INSULIN, RANDOM: Insulin: 11.8 u[IU]/mL

## 2018-08-23 ENCOUNTER — Encounter: Payer: Self-pay | Admitting: Internal Medicine

## 2018-08-26 ENCOUNTER — Encounter: Payer: Self-pay | Admitting: Internal Medicine

## 2018-11-24 NOTE — Progress Notes (Signed)
3 MONTH FOLLOW UP  Assessment/Plan:   Diagnoses and all orders for this visit:  Robin Huynh was seen today for follow-up.  Diagnoses and all orders for this visit:  Hypothyroidism, unspecified type Taking levothyroxine 50 mcg daily Reminder to take on an empty stomach 30-70mins before first meal of the day. No antacid medications for 4 hours. -     TSH  Labile hypertension (2000) Diet and exercise controlled Monitor blood pressure at home; call if consistently over 130/80 Continue DASH diet.   Reminder to go to the ER if any CP, SOB, nausea, dizziness, severe HA, changes vision/speech, left arm numbness and tingling and jaw pain. -     CBC with Diff -     COMPLETE METABOLIC PANEL WITH GFR  Vitamin D deficiency Continue supplementation, 2,000IU daily -     Vitamin D (25 hydroxy)  Atherosclerosis of aorta ([facility]) Control blood pressure, lipids and glucose Disscused lifestyle modifications, diet & exercise Continue to monitor  Hyperlipidemia Continue medications: Red yeast rice and fish oil 2g daily Discussed dietary and exercise modifications Low fat diet -     Lipid Profile  Diverticulosis Doing well at this time No medication management  Obesity (BMI 30.0-34.9) Discussed dietary and exercise modifications  .Other abnormal glucose (prediabetes) Discussed disease progression and risks Discussed diet/exercise, weight management and risk modification -     Hemoglobin A1c ([facility])  History of breast cancer Yearly mammograms  Medication management Continued  Needs flu shot Received today -     Flu vaccine HIGH DOSE PF (Fluzone High dose)   Over 40 minutes of exam, counseling, chart review and critical decision making was performed Future Appointments  Date Time Provider [place]  03/03/2019 10:45 AM Garnet Sierras, NP [facility] None  03/09/2019 10:00 AM [name], MD [facility] None  06/16/2019 10:00 AM Liane Comber, NP [facility] None       Subjective:  Robin Huynh is a 84 y.o. female who presents for 3 month follow up.   She has hx/o R Mastectomy for Rt Breast DCIS (ER+) and is followed by Dr Lindi Adie, is finished up a 5 year course in 2019 of Tamoxifen with no recurrence by annual mammograms. Has been released. She has mammograms yearly and it UTD with screening.  BMI is Body mass index is 30.27 kg/m., she has been working on diet and exercise. She has been focusing on fruits and vegetables and has been cutting down on bread. She walks 30-60 min most days.  Wt Readings from Last 3 Encounters:  11/25/18 155 lb (70.3 kg)  08/20/18 157 lb 6.4 oz (71.4 kg)  05/16/18 150 lb (68 kg)    Her blood pressure has been controlled at home, today their BP is BP: 128/70 She does workout. She denies chest pain, shortness of breath, dizziness.   She reports that she walks almost every day.  She reports she walk before 52min a day in her neighborhood.   She is not on cholesterol medication (on red yeast rice supplement only) and denies myalgias. Her cholesterol is at goal. The cholesterol last visit was:   Lab Results  Component Value Date   CHOL 191 11/25/2018   HDL 78 11/25/2018   LDLCALC 99 11/25/2018   TRIG 57 11/25/2018   CHOLHDL 2.4 11/25/2018    She has been working on diet and exercise for prediabetes, and denies foot ulcerations, hyperglycemia, increased appetite, nausea, paresthesia of the feet, polydipsia, polyuria, visual disturbances, vomiting and weight loss.  Last A1C in the office was:  Lab Results  Component Value Date   HGBA1C 6.0 (H) 11/25/2018   She is on thyroid medication. Her medication was not changed last visit.  She is taking levothyroxine 74mcg daily. Lab Results  Component Value Date   TSH 0.89 11/25/2018  . Last GFR: Lab Results  Component Value Date   GFRAA 85 11/25/2018   Patient is on Vitamin D supplement and at goal at recent check:    Lab Results  Component Value Date   VD25OH 93  11/25/2018      Medication Review: Current Outpatient Medications on File Prior to Visit  Medication Sig Dispense Refill  . aspirin 81 MG tablet Take 81 mg by mouth daily.    Marland Kitchen CALCIUM PO Take 1 tablet by mouth daily.    . Cholecalciferol (VITAMIN D) 2000 UNITS tablet Take 2,000 Units by mouth 3 (three) times daily.    . fish oil-omega-3 fatty acids 1000 MG capsule Take 2 g by mouth daily.    Marland Kitchen levothyroxine (SYNTHROID) 50 MCG tablet Take 1 tablet daily on an empty stomach with only water for 30 minutes & no Antacid meds, Calcium or Magnesium for 4 hours & avoid Biotin 90 tablet 3  . magnesium 30 MG tablet Take 30 mg by mouth 2 (two) times daily.    . Multiple Vitamin (MULTIVITAMIN) tablet Take 1 tablet by mouth daily.    . Red Yeast Rice 600 MG CAPS Take 600 mg by mouth daily.     . vitamin C (ASCORBIC ACID) 500 MG tablet Take 500 mg by mouth daily.     No current facility-administered medications on file prior to visit.     No Known Allergies  Current Problems (verified) Patient Active Problem List   Diagnosis Date Noted  . Obesity (BMI 30.0-34.9) 05/15/2018  . Encounter for Medicare annual wellness exam 10/07/2014  . Hyperlipidemia 07/15/2013  . Medication management 06/30/2013  . Labile hypertension (2000)   . Hypothyroidism   . Other abnormal glucose (prediabetes)   . Vitamin D deficiency   . Diverticulosis   . Primary cancer of lower outer quadrant of right female breast ([place]) 06/21/2011    Screening Tests Immunization History  Administered Date(s) Administered  . Influenza, High Dose Seasonal PF 11/25/2018  . Pneumococcal Conjugate-13 03/01/2014  . Pneumococcal Polysaccharide-23 01/09/2007  . Td 05/18/2008    Preventative care: Last colonoscopy: 2014 wants one last follow up - cologuard ordered - never polyps  Last mammogram: 07/2018, patient will call to schedule DEXA: 07/07/14 - normal - wants to defer PAP 2012 US thyroid with bx 02/2015 negative  Prior  vaccinations: TD or Tdap: 2010 defer  Influenza: Declines Pneumococcal: 2009 Prevnar13: 2010 Shingles/Zostavax: 2010   Review of Systems  Constitutional: Negative for malaise/fatigue and weight loss.  HENT: Negative for hearing loss and tinnitus.   Eyes: Negative for blurred vision and double vision.  Respiratory: Negative for cough, sputum production, shortness of breath and wheezing.   Cardiovascular: Negative for chest pain, palpitations, orthopnea, claudication, leg swelling and PND.  Gastrointestinal: Negative for abdominal pain, blood in stool, constipation, diarrhea, heartburn, melena, nausea and vomiting.  Genitourinary: Negative.   Musculoskeletal: Negative for falls, joint pain and myalgias.  Skin: Negative for rash.  Neurological: Negative for dizziness, tingling, sensory change, weakness and headaches.  Endo/Heme/Allergies: Negative for polydipsia.  Psychiatric/Behavioral: Negative.  Negative for depression, memory loss, substance abuse and suicidal ideas. The patient is not nervous/anxious and does not have  insomnia.   All other systems reviewed and are negative.    Objective:     Today's Vitals   11/25/18 1129  BP: 128/70  Temp: 98.6 F (37 C)  Weight: 155 lb (70.3 kg)   Body mass index is 30.27 kg/m.  General : Well sounding patient in no apparent distress HEENT: no hoarseness, no cough for duration of visit Lungs: speaks in complete sentences, no audible wheezing, no apparent distress Neurological: alert, oriented x 3 Psychiatric: pleasant, judgement appropriate     Garnet Sierras, NP [facility] 11/25/2018  12:40 PM

## 2018-11-24 NOTE — Patient Instructions (Signed)
We will contact you via MyChart with your lab results in 1-3 days.  Keep up the good work with your walking and activities!     Vit D  & Vit C 1,000 mg   are recommended to help protect  against the Covid-19 and other Corona viruses.    Also it's recommended  to take  Zinc 50 mg  to help  protect against the Covid-19   and best place to get  is also on [email or web address]  and don't pay more than 6-8 cents /pill !  ================================ Coronavirus (COVID-19) Are you at risk?  Are you at risk for the Coronavirus (COVID-19)?  To be considered HIGH RISK for Coronavirus (COVID-19), you have to meet the following criteria:  . Traveled to [place], [place], [place], [place] or [place]; or in the [place] to [place], [place], [place]  . or [place]; and have fever, cough, and shortness of breath within the last 2 weeks of travel OR . Been in close contact with a person diagnosed with COVID-19 within the last 2 weeks and have  . fever, cough,and shortness of breath .  . IF YOU DO NOT MEET THESE CRITERIA, YOU ARE CONSIDERED LOW RISK FOR COVID-19.  What to do if you are HIGH RISK for COVID-19?  Marland Kitchen If you are having a medical emergency, call 911. . Seek medical care right away. Before you go to a doctor's office, urgent care or emergency department, .  call ahead and tell them about your recent travel, contact with someone diagnosed with COVID-19  .  and your symptoms.  . You should receive instructions from your physician's office regarding next steps of care.  . When you arrive at healthcare provider, tell the healthcare staff immediately you have returned from  . visiting [place], [place], [place], [place] or [place]; or traveled in the [place] to [place], [place],  . [place] or [place] in the last two weeks or you have been in close contact with a person diagnosed with  . COVID-19 in the last 2 weeks.   . Tell the health care staff about your symptoms:  fever, cough and shortness of breath. . After you have been seen by a medical provider, you will be either: o Tested for (COVID-19) and discharged home on quarantine except to seek medical care if  o symptoms worsen, and asked to  - Stay home and avoid contact with others until you get your results (4-5 days)  - Avoid travel on public transportation if possible (such as bus, train, or airplane) or o Sent to the Emergency Department by EMS for evaluation, COVID-19 testing  and  o possible admission depending on your condition and test results.  What to do if you are LOW RISK for COVID-19?  Reduce your risk of any infection by using the same precautions used for avoiding the common cold or flu:  Marland Kitchen Wash your hands often with soap and warm water for at least 20 seconds.  If soap and water are not readily available,  . use an alcohol-based hand sanitizer with at least 60% alcohol.  . If coughing or sneezing, cover your mouth and nose by coughing or sneezing into the elbow areas of your shirt or coat, .  into a tissue or into your sleeve (not your hands). . Avoid shaking hands with others and consider head nods or verbal greetings only. . Avoid touching your eyes, nose, or mouth with unwashed hands.  Marland Kitchen  Avoid close contact with people who are sick. . Avoid places or events with large numbers of people in one location, like concerts or sporting events. . Carefully consider travel plans you have or are making. . If you are planning any travel outside or inside the [place], visit the CDC's Travelers' Health webpage for the latest health notices. . If you have some symptoms but not all symptoms, continue to monitor at home and seek medical attention  . if your symptoms worsen. . If you are having a medical emergency, call 911.   . >>>>>>>>>>>>>>>>>>>>>>>>>>>>>>>>> . We Do NOT Approve of  [facility], [employer]  Our Patients  To Do Home Visits & We Do NOT Approve of [facility]  [employer] > > > > > > > > > > > > > > > > > > > > > > > > > > > > > > > > > > > > > > >  Preventive Care for Adults  A healthy lifestyle and preventive care can promote health and wellness. Preventive health guidelines for women include the following key practices.  A routine yearly physical is a good way to check with your health care provider about your health and preventive screening. It is a chance to share any concerns and updates on your health and to receive a thorough exam.  Visit your dentist for a routine exam and preventive care every 6 months. Brush your teeth twice a day and floss once a day. Good oral hygiene prevents tooth decay and gum disease.  The frequency of eye exams is based on your age, health, family medical history, use of contact lenses, and other factors. Follow your health care provider's recommendations for frequency of eye exams.  Eat a healthy diet. Foods like vegetables, fruits, whole grains, low-fat dairy products, and lean protein foods contain the nutrients you need without too many calories. Decrease your intake of foods high in solid fats, added sugars, and salt. Eat the right amount of calories for you. Get information about a proper diet from your health care provider, if necessary.  Regular physical exercise is one of the most important things you can do for your health. Most adults should get at least 150 minutes of moderate-intensity exercise (any activity that increases your heart rate and causes you to sweat) each week. In addition, most adults need muscle-strengthening exercises on 2 or more days a week.  Maintain a healthy weight. The body mass index (BMI) is a screening tool to identify possible weight problems. It provides an estimate of body fat based on height and weight. Your health care provider can find your BMI and can help you achieve or maintain a healthy weight. For adults 20 years and older:  A BMI below 18.5 is considered underweight.  A  BMI of 18.5 to 24.9 is normal.  A BMI of 25 to 29.9 is considered overweight.  A BMI of 30 and above is considered obese.  Maintain normal blood lipids and cholesterol levels by exercising and minimizing your intake of saturated fat. Eat a balanced diet with plenty of fruit and vegetables. If your lipid or cholesterol levels are high, you are over 50, or you are at high risk for heart disease, you may need your cholesterol levels checked more frequently. Ongoing high lipid and cholesterol levels should be treated with medicines if diet and exercise are not working.  If you smoke, find out from your health care provider how to quit. If you  do not use tobacco, do not start.  Lung cancer screening is recommended for adults aged 22-80 years who are at high risk for developing lung cancer because of a history of smoking. A yearly low-dose CT scan of the lungs is recommended for people who have at least a 30-pack-year history of smoking and are a current smoker or have quit within the past 15 years. A pack year of smoking is smoking an average of 1 pack of cigarettes a day for 1 year (for example: 1 pack a day for 30 years or 2 packs a day for 15 years). Yearly screening should continue until the smoker has stopped smoking for at least 15 years. Yearly screening should be stopped for people who develop a health problem that would prevent them from having lung cancer treatment.  Avoid use of street drugs. Do not share needles with anyone. Ask for help if you need support or instructions about stopping the use of drugs.  High blood pressure causes heart disease and increases the risk of stroke.  Ongoing high blood pressure should be treated with medicines if weight loss and exercise do not work.  If you are 81-31 years old, ask your health care provider if you should take aspirin to prevent strokes.  Diabetes screening involves taking a blood sample to check your fasting blood sugar level. This should be  done once every 3 years, after age 23, if you are within normal weight and without risk factors for diabetes. Testing should be considered at a younger age or be carried out more frequently if you are overweight and have at least 1 risk factor for diabetes.  Breast cancer screening is essential preventive care for women. You should practice "breast self-awareness." This means understanding the normal appearance and feel of your breasts and may include breast self-examination. Any changes detected, no matter how small, should be reported to a health care provider. Women in their 61s and 30s should have a clinical breast exam (CBE) by a health care provider as part of a regular health exam every 1 to 3 years. After age 9, women should have a CBE every year. Starting at age 57, women should consider having a mammogram (breast X-ray test) every year. Women who have a family history of breast cancer should talk to their health care provider about genetic screening. Women at a high risk of breast cancer should talk to their health care providers about having an MRI and a mammogram every year.  Breast cancer gene (BRCA)-related cancer risk assessment is recommended for women who have family members with BRCA-related cancers. BRCA-related cancers include breast, ovarian, tubal, and peritoneal cancers. Having family members with these cancers may be associated with an increased risk for harmful changes (mutations) in the breast cancer genes BRCA1 and BRCA2. Results of the assessment will determine the need for genetic counseling and BRCA1 and BRCA2 testing.  Routine pelvic exams to screen for cancer are no longer recommended for nonpregnant women who are considered low risk for cancer of the pelvic organs (ovaries, uterus, and vagina) and who do not have symptoms. Ask your health care provider if a screening pelvic exam is right for you.  If you have had past treatment for cervical cancer or a condition that could  lead to cancer, you need Pap tests and screening for cancer for at least 20 years after your treatment. If Pap tests have been discontinued, your risk factors (such as having a new sexual partner) need to be  reassessed to determine if screening should be resumed. Some women have medical problems that increase the chance of getting cervical cancer. In these cases, your health care provider may recommend more frequent screening and Pap tests.    Colorectal cancer can be detected and often prevented. Most routine colorectal cancer screening begins at the age of 63 years and continues through age 54 years. However, your health care provider may recommend screening at an earlier age if you have risk factors for colon cancer. On a yearly basis, your health care provider may provide home test kits to check for hidden blood in the stool. Use of a small camera at the end of a tube, to directly examine the colon (sigmoidoscopy or colonoscopy), can detect the earliest forms of colorectal cancer. Talk to your health care provider about this at age 61, when routine screening begins.  Direct exam of the colon should be repeated every 5-10 years through age 46 years, unless early forms of pre-cancerous polyps or small growths are found.  Osteoporosis is a disease in which the bones lose minerals and strength with aging. This can result in serious bone fractures or breaks. The risk of osteoporosis can be identified using a bone density scan. Women ages 79 years and over and women at risk for fractures or osteoporosis should discuss screening with their health care providers. Ask your health care provider whether you should take a calcium supplement or vitamin D to reduce the rate of osteoporosis.  Menopause can be associated with physical symptoms and risks. Hormone replacement therapy is available to decrease symptoms and risks. You should talk to your health care provider about whether hormone replacement therapy is right  for you.  Use sunscreen. Apply sunscreen liberally and repeatedly throughout the day. You should seek shade when your shadow is shorter than you. Protect yourself by wearing long sleeves, pants, a wide-brimmed hat, and sunglasses year round, whenever you are outdoors.  Once a month, do a whole body skin exam, using a mirror to look at the skin on your back. Tell your health care provider of new moles, moles that have irregular borders, moles that are larger than a pencil eraser, or moles that have changed in shape or color.  Stay current with required vaccines (immunizations).  Influenza vaccine. All adults should be immunized every year.  Tetanus, diphtheria, and acellular pertussis (Td, Tdap) vaccine. Pregnant women should receive 1 dose of Tdap vaccine during each pregnancy. The dose should be obtained regardless of the length of time since the last dose. Immunization is preferred during the 27th-36th week of gestation. An adult who has not previously received Tdap or who does not know her vaccine status should receive 1 dose of Tdap. This initial dose should be followed by tetanus and diphtheria toxoids (Td) booster doses every 10 years. Adults with an unknown or incomplete history of completing a 3-dose immunization series with Td-containing vaccines should begin or complete a primary immunization series including a Tdap dose. Adults should receive a Td booster every 10 years.    Zoster vaccine. One dose is recommended for adults aged 64 years or older unless certain conditions are present.    Pneumococcal 13-valent conjugate (PCV13) vaccine. When indicated, a person who is uncertain of her immunization history and has no record of immunization should receive the PCV13 vaccine. An adult aged 42 years or older who has certain medical conditions and has not been previously immunized should receive 1 dose of PCV13 vaccine. This PCV13 should  be followed with a dose of pneumococcal polysaccharide  (PPSV23) vaccine. The PPSV23 vaccine dose should be obtained at least 1 or more year(s) after the dose of PCV13 vaccine. An adult aged 100 years or older who has certain medical conditions and previously received 1 or more doses of PPSV23 vaccine should receive 1 dose of PCV13. The PCV13 vaccine dose should be obtained 1 or more years after the last PPSV23 vaccine dose.    Pneumococcal polysaccharide (PPSV23) vaccine. When PCV13 is also indicated, PCV13 should be obtained first. All adults aged 47 years and older should be immunized. An adult younger than age 53 years who has certain medical conditions should be immunized. Any person who resides in a nursing home or long-term care facility should be immunized. An adult smoker should be immunized. People with an immunocompromised condition and certain other conditions should receive both PCV13 and PPSV23 vaccines. People with human immunodeficiency virus (HIV) infection should be immunized as soon as possible after diagnosis. Immunization during chemotherapy or radiation therapy should be avoided. Routine use of PPSV23 vaccine is not recommended for American Indians, [place], or people younger than 65 years unless there are medical conditions that require PPSV23 vaccine. When indicated, people who have unknown immunization and have no record of immunization should receive PPSV23 vaccine. One-time revaccination 5 years after the first dose of PPSV23 is recommended for people aged 19-64 years who have chronic kidney failure, nephrotic syndrome, asplenia, or immunocompromised conditions. People who received 1-2 doses of PPSV23 before age 77 years should receive another dose of PPSV23 vaccine at age 61 years or later if at least 5 years have passed since the previous dose. Doses of PPSV23 are not needed for people immunized with PPSV23 at or after age 44 years.   Preventive Services / Frequency  Ages 62 years and over  Blood pressure check.  Lipid and  cholesterol check.  Lung cancer screening. / Every year if you are aged 54-80 years and have a 30-pack-year history of smoking and currently smoke or have quit within the past 15 years. Yearly screening is stopped once you have quit smoking for at least 15 years or develop a health problem that would prevent you from having lung cancer treatment.  Clinical breast exam.** / Every year after age 1 years.   BRCA-related cancer risk assessment.** / For women who have family members with a BRCA-related cancer (breast, ovarian, tubal, or peritoneal cancers).  Mammogram.** / Every year beginning at age 7 years and continuing for as long as you are in good health. Consult with your health care provider.  Pap test.** / Every 3 years starting at age 47 years through age 75 or 29 years with 3 consecutive normal Pap tests. Testing can be stopped between 65 and 70 years with 3 consecutive normal Pap tests and no abnormal Pap or HPV tests in the past 10 years.  Fecal occult blood test (FOBT) of stool. / Every year beginning at age 74 years and continuing until age 59 years. You may not need to do this test if you get a colonoscopy every 10 years.  Flexible sigmoidoscopy or colonoscopy.** / Every 5 years for a flexible sigmoidoscopy or every 10 years for a colonoscopy beginning at age 33 years and continuing until age 56 years.  Hepatitis C blood test.** / For all people born from [birth year] through 1963 and any individual with known risks for hepatitis C.  Osteoporosis screening.** / A one-time screening for women ages 70  years and over and women at risk for fractures or osteoporosis.  Skin self-exam. / Monthly.  Influenza vaccine. / Every year.  Tetanus, diphtheria, and acellular pertussis (Tdap/Td) vaccine.** / 1 dose of Td every 10 years.  Zoster vaccine.** / 1 dose for adults aged 91 years or older.  Pneumococcal 13-valent conjugate (PCV13) vaccine.** / Consult your health care  provider.  Pneumococcal polysaccharide (PPSV23) vaccine.** / 1 dose for all adults aged 72 years and older. Screening for abdominal aortic aneurysm (AAA)  by ultrasound is recommended for people who have history of high blood pressure or who are current or former smokers. ++++++++++++++++++++ Recommend Adult Low Dose Aspirin or  coated  Aspirin 81 mg daily  To reduce risk of Colon Cancer 40 %,  Skin Cancer 26 % ,  Melanoma 46%  and  Pancreatic cancer 60% ++++++++++++++++++++ Vitamin D goal  is between 70-100.  Please make sure that you are taking your Vitamin D as directed.  It is very important as a natural anti-inflammatory  helping hair, skin, and nails, as well as reducing stroke and heart attack risk.  It helps your bones and helps with mood. It also decreases numerous cancer risks so please take it as directed.  Low Vit D is associated with a 200-300% higher risk for CANCER  and 200-300% higher risk for HEART   ATTACK  &  STROKE.   .....................................Marland Kitchen It is also associated with higher death rate at younger ages,  autoimmune diseases like Rheumatoid arthritis, Lupus, Multiple Sclerosis.    Also many other serious conditions, like depression, Alzheimer's Dementia, infertility, muscle aches, fatigue, fibromyalgia - just to name a few. ++++++++++++++++++ Recommend the book "The END of DIETING" by Dr Excell Seltzer  & the book "The END of DIABETES " by Dr Excell Seltzer At [email or web address] - get book & Audio CD's    Being diabetic has a  300% increased risk for heart attack, stroke, cancer, and alzheimer- type vascular dementia. It is very important that you work harder with diet by avoiding all foods that are white. Avoid white rice (brown & wild rice is OK), white potatoes (sweetpotatoes in moderation is OK), White bread or wheat bread or anything made out of white flour like bagels, donuts, rolls, buns, biscuits, cakes, pastries, cookies, pizza crust, and pasta (made from  white flour & egg whites) - vegetarian pasta or spinach or wheat pasta is OK. Multigrain breads like Arnold's or [employer], or multigrain sandwich thins or flatbreads.  Diet, exercise and weight loss can reverse and cure diabetes in the early stages.  Diet, exercise and weight loss is very important in the control and prevention of complications of diabetes which affects every system in your body, ie. Brain - dementia/stroke, eyes - glaucoma/blindness, heart - heart attack/heart failure, kidneys - dialysis, stomach - gastric paralysis, intestines - malabsorption, nerves - severe painful neuritis, circulation - gangrene & loss of a leg(s), and finally cancer and Alzheimers.    I recommend avoid fried & greasy foods,  sweets/candy, white rice (brown or wild rice or Quinoa is OK), white potatoes (sweet potatoes are OK) - anything made from white flour - bagels, doughnuts, rolls, buns, biscuits,white and wheat breads, pizza crust and traditional pasta made of white flour & egg white(vegetarian pasta or spinach or wheat pasta is OK).  Multi-grain bread is OK - like multi-grain flat bread or sandwich thins. Avoid alcohol in excess. Exercise is also important.    Eat all the vegetables you want -  avoid meat, especially red meat and dairy - especially cheese.  Cheese is the most concentrated form of trans-fats which is the worst thing to clog up our arteries. Veggie cheese is OK which can be found in the fresh produce section at [employer] or [employer] or [employer]  +++++++++++++++++++ DASH Eating Plan  DASH stands for "Dietary Approaches to Stop Hypertension."   The DASH eating plan is a healthy eating plan that has been shown to reduce high blood pressure (hypertension). Additional health benefits may include reducing the risk of type 2 diabetes mellitus, heart disease, and stroke. The DASH eating plan may also help with weight loss. WHAT DO I NEED TO KNOW ABOUT THE DASH EATING PLAN? For the DASH  eating plan, you will follow these general guidelines:  Choose foods with a percent daily value for sodium of less than 5% (as listed on the food label).  Use salt-free seasonings or herbs instead of table salt or sea salt.  Check with your health care provider or pharmacist before using salt substitutes.  Eat lower-sodium products, often labeled as "lower sodium" or "no salt added."  Eat fresh foods.  Eat more vegetables, fruits, and low-fat dairy products.  Choose whole grains. Look for the word "whole" as the first word in the ingredient list.  Choose fish   Limit sweets, desserts, sugars, and sugary drinks.  Choose heart-healthy fats.  Eat veggie cheese   Eat more home-cooked food and less restaurant, buffet, and fast food.  Limit fried foods.  Cook foods using methods other than frying.  Limit canned vegetables. If you do use them, rinse them well to decrease the sodium.  When eating at a restaurant, ask that your food be prepared with less salt, or no salt if possible.                      WHAT FOODS CAN I EAT? Read Dr Fara Olden Fuhrman's books on The End of Dieting & The End of Diabetes  Grains Whole grain or whole wheat bread. Brown rice. Whole grain or whole wheat pasta. Quinoa, bulgur, and whole grain cereals. Low-sodium cereals. Corn or whole wheat flour tortillas. Whole grain cornbread. Whole grain crackers. Low-sodium crackers.  Vegetables Fresh or frozen vegetables (raw, steamed, roasted, or grilled). Low-sodium or reduced-sodium tomato and vegetable juices. Low-sodium or reduced-sodium tomato sauce and paste. Low-sodium or reduced-sodium canned vegetables.   Fruits All fresh, canned (in natural juice), or frozen fruits.  Protein Products  All fish and seafood.  Dried beans, peas, or lentils. Unsalted nuts and seeds. Unsalted canned beans.  Dairy Low-fat dairy products, such as skim or 1% milk, 2% or reduced-fat cheeses, low-fat ricotta or cottage cheese, or  plain low-fat yogurt. Low-sodium or reduced-sodium cheeses.  Fats and Oils Tub margarines without trans fats. Light or reduced-fat mayonnaise and salad dressings (reduced sodium). Avocado. Safflower, olive, or canola oils. Natural peanut or almond butter.  Other Unsalted popcorn and pretzels. The items listed above may not be a complete list of recommended foods or beverages. Contact your dietitian for more options.  +++++++++++++++  WHAT FOODS ARE NOT RECOMMENDED? Grains/ White flour or wheat flour White bread. White pasta. White rice. Refined cornbread. Bagels and croissants. Crackers that contain trans fat.  Vegetables  Creamed or fried vegetables. Vegetables in a . Regular canned vegetables. Regular canned tomato sauce and paste. Regular tomato and vegetable juices.  Fruits Dried fruits. Canned fruit in light or heavy syrup. Fruit juice.  Meat and Other Protein Products Meat in general - RED meat & White meat.  Fatty cuts of meat. Ribs, chicken wings, all processed meats as bacon, sausage, bologna, salami, fatback, hot dogs, bratwurst and packaged luncheon meats.  Dairy Whole or 2% milk, cream, half-and-half, and cream cheese. Whole-fat or sweetened yogurt. Full-fat cheeses or blue cheese. Non-dairy creamers and whipped toppings. Processed cheese, cheese spreads, or cheese curds.  Condiments Onion and garlic salt, seasoned salt, table salt, and sea salt. Canned and packaged gravies. Worcestershire sauce. Tartar sauce. Barbecue sauce. Teriyaki sauce. Soy sauce, including reduced sodium. Steak sauce. Fish sauce. Oyster sauce. Cocktail sauce. Horseradish. Ketchup and mustard. Meat flavorings and tenderizers. Bouillon cubes. Hot sauce. Tabasco sauce. Marinades. Taco seasonings. Relishes.  Fats and Oils Butter, stick margarine, lard, shortening and bacon fat. Coconut, palm kernel, or palm oils. Regular salad dressings.  Pickles and olives. Salted popcorn and pretzels.  The items  listed above may not be a complete list of foods and beverages to avoid.

## 2018-11-25 ENCOUNTER — Other Ambulatory Visit: Payer: Self-pay

## 2018-11-25 ENCOUNTER — Encounter: Payer: Self-pay | Admitting: Adult Health Nurse Practitioner

## 2018-11-25 ENCOUNTER — Ambulatory Visit: Payer: Medicare Other | Admitting: Adult Health Nurse Practitioner

## 2018-11-25 VITALS — BP 128/70 | Temp 98.6°F | Wt 155.0 lb

## 2018-11-25 DIAGNOSIS — I700 Atherosclerosis of aorta: Secondary | ICD-10-CM | POA: Diagnosis not present

## 2018-11-25 DIAGNOSIS — K5790 Diverticulosis of intestine, part unspecified, without perforation or abscess without bleeding: Secondary | ICD-10-CM

## 2018-11-25 DIAGNOSIS — E039 Hypothyroidism, unspecified: Secondary | ICD-10-CM | POA: Diagnosis not present

## 2018-11-25 DIAGNOSIS — E66811 Obesity, class 1: Secondary | ICD-10-CM

## 2018-11-25 DIAGNOSIS — E782 Mixed hyperlipidemia: Secondary | ICD-10-CM

## 2018-11-25 DIAGNOSIS — E559 Vitamin D deficiency, unspecified: Secondary | ICD-10-CM | POA: Diagnosis not present

## 2018-11-25 DIAGNOSIS — R0989 Other specified symptoms and signs involving the circulatory and respiratory systems: Secondary | ICD-10-CM

## 2018-11-25 DIAGNOSIS — Z853 Personal history of malignant neoplasm of breast: Secondary | ICD-10-CM

## 2018-11-25 DIAGNOSIS — Z23 Encounter for immunization: Secondary | ICD-10-CM

## 2018-11-25 DIAGNOSIS — E669 Obesity, unspecified: Secondary | ICD-10-CM

## 2018-11-25 DIAGNOSIS — R7309 Other abnormal glucose: Secondary | ICD-10-CM

## 2018-11-25 DIAGNOSIS — Z79899 Other long term (current) drug therapy: Secondary | ICD-10-CM

## 2018-11-26 LAB — CBC WITH DIFFERENTIAL/PLATELET
Absolute Monocytes: 546 cells/uL (ref 200–950)
Basophils Absolute: 19 {cells}/uL (ref 0–200)
Basophils Relative: 0.3 %
Eosinophils Absolute: 198 cells/uL (ref 15–500)
Eosinophils Relative: 3.2 %
HCT: 38.8 % (ref 35.0–45.0)
Hemoglobin: 12.7 g/dL (ref 11.7–15.5)
Lymphs Abs: 2164 cells/uL (ref 850–3900)
MCH: 27.4 pg (ref 27.0–33.0)
MCHC: 32.7 g/dL (ref 32.0–36.0)
MCV: 83.8 fL (ref 80.0–100.0)
MPV: 12.4 fL (ref 7.5–12.5)
Monocytes Relative: 8.8 %
Neutro Abs: 3274 cells/uL (ref 1500–7800)
Neutrophils Relative %: 52.8 %
Platelets: 161 10*3/uL (ref 140–400)
RBC: 4.63 10*6/uL (ref 3.80–5.10)
RDW: 13.7 % (ref 11.0–15.0)
Total Lymphocyte: 34.9 %
WBC: 6.2 10*3/uL (ref 3.8–10.8)

## 2018-11-26 LAB — HEMOGLOBIN A1C
Hgb A1c MFr Bld: 6 % of total Hgb — ABNORMAL HIGH (ref <5.7)
Mean Plasma Glucose: 126 (calc)
eAG (mmol/L): 7 (calc)

## 2018-11-26 LAB — COMPLETE METABOLIC PANEL WITH GFR
AG Ratio: 1.1 (calc) (ref 1.0–2.5)
ALT: 8 U/L (ref 6–29)
AST: 26 U/L (ref 10–35)
Albumin: 3.8 g/dL (ref 3.6–5.1)
Alkaline phosphatase (APISO): 80 U/L (ref 37–153)
BUN: 17 mg/dL (ref 7–25)
CO2: 28 mmol/L (ref 20–32)
Calcium: 9.7 mg/dL (ref 8.6–10.4)
Chloride: 102 mmol/L (ref 98–110)
Creat: 0.76 mg/dL (ref 0.60–0.88)
GFR, Est African American: 85 mL/min/{1.73_m2} (ref >=60)
GFR, Est Non African American: 73 mL/min/{1.73_m2} (ref >=60)
Globulin: 3.4 g/dL (calc) (ref 1.9–3.7)
Glucose, Bld: 89 mg/dL (ref 65–99)
Potassium: 4.4 mmol/L (ref 3.5–5.3)
Sodium: 138 mmol/L (ref 135–146)
Total Bilirubin: 0.4 mg/dL (ref 0.2–1.2)
Total Protein: 7.2 g/dL (ref 6.1–8.1)

## 2018-11-26 LAB — LIPID PANEL
Cholesterol: 191 mg/dL (ref <200)
HDL: 78 mg/dL (ref >=50)
LDL Cholesterol (Calc): 99 mg/dL (calc)
Non-HDL Cholesterol (Calc): 113 mg/dL (calc) (ref <130)
Total CHOL/HDL Ratio: 2.4 (calc) (ref <5.0)
Triglycerides: 57 mg/dL (ref <150)

## 2018-11-26 LAB — TSH: TSH: 0.89 mIU/L (ref 0.40–4.50)

## 2018-11-26 LAB — VITAMIN D 25 HYDROXY (VIT D DEFICIENCY, FRACTURES): Vit D, 25-Hydroxy: 93 ng/mL (ref 30–100)

## 2019-01-21 ENCOUNTER — Ambulatory Visit: Payer: Medicare Other | Attending: Internal Medicine

## 2019-01-21 DIAGNOSIS — Z23 Encounter for immunization: Secondary | ICD-10-CM | POA: Insufficient documentation

## 2019-01-21 NOTE — Progress Notes (Signed)
.    Covid-19 Vaccination Clinic  Name:  Robin Huynh    MRN: [id number] DOB: July 18, 1934  01/21/2019  [name] was observed post Covid-19 immunization for 15 minutes without incidence. She was provided with Vaccine Information Sheet and instruction to access the V-Safe system.   [name] was instructed to call 911 with any severe reactions post vaccine: Marland Kitchen Difficulty breathing  . Swelling of your face and throat  . A fast heartbeat  . A bad rash all over your body  . Dizziness and weakness    Immunizations Administered    Name Date Dose VIS Date Route   Pfizer COVID-19 Vaccine 01/21/2019 12:00 PM 0.3 mL 12/19/2018 Intramuscular   Manufacturer: [place]   Lot: [id number]   [place]: [id number]

## 2019-02-10 ENCOUNTER — Ambulatory Visit: Payer: Medicare PPO | Attending: Internal Medicine

## 2019-02-10 DIAGNOSIS — Z23 Encounter for immunization: Secondary | ICD-10-CM

## 2019-02-10 NOTE — Progress Notes (Signed)
   Covid-19 Vaccination Clinic  Name:  Robin Huynh    MRN: [id number] DOB: [birth year]  02/10/2019  Robin Huynh was observed post Covid-19 immunization for 15 minutes without incidence. She was provided with Vaccine Information Sheet and instruction to access the V-Safe system.   Robin Huynh was instructed to call 911 with any severe reactions post vaccine: Marland Kitchen Difficulty breathing  . Swelling of your face and throat  . A fast heartbeat  . A bad rash all over your body  . Dizziness and weakness    Immunizations Administered    Name Date Dose VIS Date Route   Pfizer COVID-19 Vaccine 02/10/2019 10:33 AM 0.3 mL 12/19/2018 Intramuscular   Manufacturer: [place]   Lot: [id number]   [place]: [id number]

## 2019-03-03 ENCOUNTER — Ambulatory Visit: Payer: Medicare Other | Admitting: Adult Health Nurse Practitioner

## 2019-03-06 ENCOUNTER — Other Ambulatory Visit: Payer: Self-pay

## 2019-03-06 ENCOUNTER — Ambulatory Visit: Payer: Self-pay | Admitting: Adult Health

## 2019-03-06 ENCOUNTER — Encounter: Payer: Self-pay | Admitting: Adult Health

## 2019-03-06 VITALS — BP 150/82 | HR 62 | Temp 97.7°F | Wt 157.0 lb

## 2019-03-06 DIAGNOSIS — R0789 Other chest pain: Secondary | ICD-10-CM

## 2019-03-06 MED ORDER — TIZANIDINE HCL 2 MG PO CAPS: 2.0000 mg | ORAL_CAPSULE | Freq: Three times a day (TID) | ORAL | 0 refills | Status: DC | PRN
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
## 2019-03-08 ENCOUNTER — Encounter: Payer: Self-pay | Admitting: Internal Medicine

## 2019-03-08 NOTE — Patient Instructions (Signed)

## 2019-03-08 NOTE — Progress Notes (Signed)
Annual Screening/Preventative Visit & Comprehensive Evaluation &  Examination     This very nice 85 y.o. MBF  presents for a Screening /Preventative Visit & comprehensive evaluation and management of multiple medical co-morbidities.  Patient has been followed for HTN, HLD, Prediabetes  and Vitamin D Deficiency.     Patient has hx/o Rt Breast Cancer in 2013 & is followed by Dr Lindi Adie.     Patient has hx/o labile  HTN followed expectantly since 2000. Patient's BP has been controlled at home and patient denies any cardiac symptoms as chest pain, palpitations, shortness of breath, dizziness or ankle swelling. Today's BP is at goal - 116/72.      Patient's hyperlipidemia is controlled with diet. Last lipids were at goal:  Lab Results  Component Value Date   CHOL 191 11/25/2018   HDL 78 11/25/2018   LDLCALC 99 11/25/2018   TRIG 57 11/25/2018   CHOLHDL 2.4 11/25/2018       Patient has hx/o prediabetes (A1c 6.1% / 2011&6.4% / 2012)  and patient denies reactive hypoglycemic symptoms, visual blurring, diabetic polys or paresthesias. Last A1c was still not at goal:  Lab Results  Component Value Date   HGBA1C 6.0 (H) 11/25/2018      Patient was dx'd with Goiter & Hypothyroid in 2011 & has been on replacement therapy since then.     Finally, patient has history of Vitamin D Deficiency and last Vitamin D was at goal:  Lab Results  Component Value Date   VD25OH 93 11/25/2018    Current Outpatient Medications on File Prior to Visit  Medication Sig  . aspirin 81 MG tablet Take 81 mg by mouth daily.  Marland Kitchen CALCIUM PO Take 1 tablet by mouth daily.  . Cholecalciferol (VITAMIN D) 2000 UNITS tablet Take 2,000 Units by mouth 3 (three) times daily.  . fish oil-omega-3 fatty acids 1000 MG capsule Take 2 g by mouth daily.  Marland Kitchen levothyroxine (SYNTHROID) 50 MCG tablet Take 1 tablet daily on an empty stomach with only water for 30 minutes & no Antacid meds, Calcium or Magnesium for 4 hours & avoid Biotin    . magnesium 30 MG tablet Take 30 mg by mouth 2 (two) times daily.  . Multiple Vitamin (MULTIVITAMIN) tablet Take 1 tablet by mouth daily.  . Red Yeast Rice 600 MG CAPS Take 600 mg by mouth daily.   . tizanidine (ZANAFLEX) 2 MG capsule Take 1 capsule (2 mg total) by mouth 3 (three) times daily as needed for muscle spasms.  . vitamin C (ASCORBIC ACID) 500 MG tablet Take 500 mg by mouth daily.   No current facility-administered medications on file prior to visit.   No Known Allergies Past Medical History:  Diagnosis Date  . Breast cancer ([place]) 08/2011   right  . Diverticulosis   . Hypothyroidism   . Labile hypertension   . Prediabetes   . Thoracic aorta atherosclerosis ([place])    via CXR  . Vitamin D deficiency    Health Maintenance  Topic Date Due  . TETANUS/TDAP  05/19/2018  . MAMMOGRAM  08/08/2019  . INFLUENZA VACCINE  Completed  . DEXA SCAN  Completed  . PNA vac Low Risk Adult  Completed   Immunization History  Administered Date(s) Administered  . Influenza, High Dose Seasonal PF 11/25/2018  . PFIZER SARS-COV-2 Vaccination 01/21/2019, 02/10/2019  . Pneumococcal Conjugate-13 03/01/2014  . Pneumococcal Polysaccharide-23 01/09/2007  . Td 05/18/2008   Last Colon -  08/18/2012 - Dr Sharlett Iles - hyperplastic  polyp - recc 10 yr f/u  In Aug 2024 (& patient would be 85 yo )  Last MGM - 07.31.2020   Past Surgical History:  Procedure Laterality Date  . BREAST SURGERY     right breast  . COLONOSCOPY    . MASTECTOMY COMPLETE / SIMPLE W/ SENTINEL NODE BIOPSY  08/10/11   right  . MASTECTOMY W/ SENTINEL NODE BIOPSY  08/10/2011   Procedure: MASTECTOMY WITH SENTINEL LYMPH NODE BIOPSY;  Surgeon: Harl Bowie, MD;  Location: [facility];  Service: General;  Laterality: Right;   Family History  Problem Relation Age of Onset  . Colon cancer Mother 17  . Prostate cancer Father   . Lung cancer Brother   . Lung cancer Sister   . Kidney cancer Brother    Social History   Tobacco Use  .  Smoking status: Never Smoker  . Smokeless tobacco: Never Used  Substance Use Topics  . Alcohol use: No  . Drug use: No    ROS Constitutional: Denies fever, chills, weight loss/gain, headaches, insomnia,  night sweats, and change in appetite. Does c/o fatigue. Eyes: Denies redness, blurred vision, diplopia, discharge, itchy, watery eyes.  ENT: Denies discharge, congestion, post nasal drip, epistaxis, sore throat, earache, hearing loss, dental pain, Tinnitus, Vertigo, Sinus pain, snoring.  Cardio: Denies chest pain, palpitations, irregular heartbeat, syncope, dyspnea, diaphoresis, orthopnea, PND, claudication, edema Respiratory: denies cough, dyspnea, DOE, pleurisy, hoarseness, laryngitis, wheezing.  Gastrointestinal: Denies dysphagia, heartburn, reflux, water brash, pain, cramps, nausea, vomiting, bloating, diarrhea, constipation, hematemesis, melena, hematochezia, jaundice, hemorrhoids Genitourinary: Denies dysuria, frequency, urgency, nocturia, hesitancy, discharge, hematuria, flank pain Breast: Breast lumps, nipple discharge, bleeding.  Musculoskeletal: Denies arthralgia, myalgia, stiffness, Jt. Swelling, pain, limp, and strain/sprain. Denies falls. Skin: Denies puritis, rash, hives, warts, acne, eczema, changing in skin lesion Neuro: No weakness, tremor, incoordination, spasms, paresthesia, pain Psychiatric: Denies confusion, memory loss, sensory loss. Denies Depression. Endocrine: Denies change in weight, skin, hair change, nocturia, and paresthesia, diabetic polys, visual blurring, hyper / hypo glycemic episodes.  Heme/Lymph: No excessive bleeding, bruising, enlarged lymph nodes.  Physical Exam  BP 116/72   Pulse 60   Temp (!) 97.2 F (36.2 C)   Resp 16   Ht 5' (1.524 m)   Wt 156 lb (70.8 kg)   BMI 30.47 kg/m   General Appearance: Well nourished, well groomed and in no apparent distress.  Eyes: PERRLA, EOMs, conjunctiva no swelling or erythema, normal fundi and  vessels. Sinuses: No frontal/maxillary tenderness ENT/Mouth: EACs patent / TMs  nl. Nares clear without erythema, swelling, mucoid exudates. Oral hygiene is good. No erythema, swelling, or exudate. Tongue normal, non-obstructing. Tonsils not swollen or erythematous. Hearing normal.  Neck: Supple, thyroid not palpable. No bruits, nodes or JVD. Respiratory: Respiratory effort normal.  BS equal and clear bilateral without rales, rhonci, wheezing or stridor. Cardio: Heart sounds are normal with regular rate and rhythm and no murmurs, rubs or gallops. Peripheral pulses are normal and equal bilaterally without edema. No aortic or femoral bruits. Chest: symmetric with normal excursions and percussion. Breasts: Rt Breast surgically absent and Left Breast without lumps, nipple discharge, retractions, or fibrocystic changes.  Abdomen: Flat, soft with bowel sounds active. Nontender, no guarding, rebound, hernias, masses, or organomegaly.  Lymphatics: Non tender without lymphadenopathy.  Musculoskeletal: Full ROM all peripheral extremities, joint stability, 5/5 strength, and normal gait. Skin: Warm and dry without rashes, lesions, cyanosis, clubbing or  ecchymosis.  Neuro: Cranial nerves intact, reflexes equal bilaterally. Normal muscle tone, no  cerebellar symptoms. Sensation intact.  Pysch: Alert and oriented X 3, normal affect, Insight and Judgment appropriate.   Assessment and Plan  1. Annual Preventative Screening Examination  2. Labile hypertension (2000)  - EKG 12-Lead - Urinalysis, Routine w reflex microscopic - Microalbumin / creatinine urine ratio - CBC with Differential/Platelet - COMPLETE METABOLIC PANEL WITH GFR - Magnesium - TSH  3. Hyperlipidemia, mixed  - EKG 12-Lead - Lipid panel - TSH  4. Abnormal glucose  - EKG 12-Lead - Hemoglobin A1c - Insulin, random  5. Vitamin D deficiency  - VITAMIN D 25 Hydroxy  6. Prediabetes  - Hemoglobin A1c - Insulin, random  7.  Hypothyroidism, unspecified type  - TSH  8. Screening for colorectal cancer  - POC Hemoccult Bld/Stl  9. Screening for ischemic heart disease  - EKG 12-Lead  10. Atherosclerosis of aorta ([facility])  - EKG 12-Lead  11. Medication management  - Urinalysis, Routine w reflex microscopic - Microalbumin / creatinine urine ratio - CBC with Differential/Platelet - COMPLETE METABOLIC PANEL WITH GFR - Magnesium - Lipid panel - TSH - Hemoglobin A1c - Insulin, random - VITAMIN D 25 Hydroxy        Patient was counseled in prudent diet to achieve /maintain BMI less than 25 for weight control, BP monitoring, regular exercise and medications. Discussed med's effects and SE's. Screening labs and tests as requested with regular follow-up as recommended. Over 40 minutes of exam, counseling, chart review and high complex critical decision making was performed.   Kirtland Bouchard, MD

## 2019-03-09 ENCOUNTER — Ambulatory Visit: Payer: Medicare PPO | Admitting: Internal Medicine

## 2019-03-09 ENCOUNTER — Other Ambulatory Visit: Payer: Self-pay

## 2019-03-09 VITALS — BP 116/72 | HR 60 | Temp 97.2°F | Resp 16 | Ht 60.0 in | Wt 156.0 lb

## 2019-03-09 DIAGNOSIS — Z136 Encounter for screening for cardiovascular disorders: Secondary | ICD-10-CM

## 2019-03-09 DIAGNOSIS — R7309 Other abnormal glucose: Secondary | ICD-10-CM | POA: Diagnosis not present

## 2019-03-09 DIAGNOSIS — Z0000 Encounter for general adult medical examination without abnormal findings: Secondary | ICD-10-CM | POA: Diagnosis not present

## 2019-03-09 DIAGNOSIS — E782 Mixed hyperlipidemia: Secondary | ICD-10-CM | POA: Diagnosis not present

## 2019-03-09 DIAGNOSIS — R0989 Other specified symptoms and signs involving the circulatory and respiratory systems: Secondary | ICD-10-CM

## 2019-03-09 DIAGNOSIS — Z79899 Other long term (current) drug therapy: Secondary | ICD-10-CM | POA: Diagnosis not present

## 2019-03-09 DIAGNOSIS — E039 Hypothyroidism, unspecified: Secondary | ICD-10-CM

## 2019-03-09 DIAGNOSIS — I10 Essential (primary) hypertension: Secondary | ICD-10-CM | POA: Diagnosis not present

## 2019-03-09 DIAGNOSIS — R7303 Prediabetes: Secondary | ICD-10-CM

## 2019-03-09 DIAGNOSIS — E559 Vitamin D deficiency, unspecified: Secondary | ICD-10-CM

## 2019-03-09 DIAGNOSIS — Z1211 Encounter for screening for malignant neoplasm of colon: Secondary | ICD-10-CM

## 2019-03-09 DIAGNOSIS — I700 Atherosclerosis of aorta: Secondary | ICD-10-CM

## 2019-03-09 DIAGNOSIS — Z0001 Encounter for general adult medical examination with abnormal findings: Secondary | ICD-10-CM

## 2019-03-10 LAB — COMPLETE METABOLIC PANEL WITH GFR
AG Ratio: 1.2 (calc) (ref 1.0–2.5)
ALT: 6 U/L (ref 6–29)
AST: 25 U/L (ref 10–35)
Albumin: 3.9 g/dL (ref 3.6–5.1)
Alkaline phosphatase (APISO): 69 U/L (ref 37–153)
BUN: 13 mg/dL (ref 7–25)
CO2: 28 mmol/L (ref 20–32)
Calcium: 9.6 mg/dL (ref 8.6–10.4)
Chloride: 102 mmol/L (ref 98–110)
Creat: 0.78 mg/dL (ref 0.60–0.88)
GFR, Est African American: 82 mL/min/{1.73_m2} (ref >=60)
GFR, Est Non African American: 71 mL/min/{1.73_m2} (ref >=60)
Globulin: 3.3 g/dL (calc) (ref 1.9–3.7)
Glucose, Bld: 89 mg/dL (ref 65–99)
Potassium: 4.2 mmol/L (ref 3.5–5.3)
Sodium: 138 mmol/L (ref 135–146)
Total Bilirubin: 0.4 mg/dL (ref 0.2–1.2)
Total Protein: 7.2 g/dL (ref 6.1–8.1)

## 2019-03-10 LAB — URINALYSIS, ROUTINE W REFLEX MICROSCOPIC
Bacteria, UA: NONE SEEN /HPF
Bilirubin Urine: NEGATIVE
Glucose, UA: NEGATIVE
Hgb urine dipstick: NEGATIVE
Hyaline Cast: NONE SEEN /LPF
Ketones, ur: NEGATIVE
Nitrite: NEGATIVE
Protein, ur: NEGATIVE
RBC / HPF: NONE SEEN /HPF (ref 0–2)
Specific Gravity, Urine: 1.012 (ref 1.001–1.03)
pH: 6.5 (ref 5.0–8.0)

## 2019-03-10 LAB — HEMOGLOBIN A1C
Hgb A1c MFr Bld: 6 % of total Hgb — ABNORMAL HIGH (ref <5.7)
Mean Plasma Glucose: 126 (calc)
eAG (mmol/L): 7 (calc)

## 2019-03-10 LAB — CBC WITH DIFFERENTIAL/PLATELET
Absolute Monocytes: 777 cells/uL (ref 200–950)
Basophils Absolute: 29 cells/uL (ref 0–200)
Basophils Relative: 0.5 %
Eosinophils Absolute: 110 cells/uL (ref 15–500)
Eosinophils Relative: 1.9 %
HCT: 39.4 % (ref 35.0–45.0)
Hemoglobin: 12.7 g/dL (ref 11.7–15.5)
Lymphs Abs: 1792 cells/uL (ref 850–3900)
MCH: 27.1 pg (ref 27.0–33.0)
MCHC: 32.2 g/dL (ref 32.0–36.0)
MCV: 84 fL (ref 80.0–100.0)
MPV: 12.3 fL (ref 7.5–12.5)
Monocytes Relative: 13.4 %
Neutro Abs: 3091 cells/uL (ref 1500–7800)
Neutrophils Relative %: 53.3 %
Platelets: 153 10*3/uL (ref 140–400)
RBC: 4.69 10*6/uL (ref 3.80–5.10)
RDW: 13.3 % (ref 11.0–15.0)
Total Lymphocyte: 30.9 %
WBC: 5.8 10*3/uL (ref 3.8–10.8)

## 2019-03-10 LAB — LIPID PANEL
Cholesterol: 171 mg/dL (ref <200)
HDL: 74 mg/dL (ref >=50)
LDL Cholesterol (Calc): 85 mg/dL (calc)
Non-HDL Cholesterol (Calc): 97 mg/dL (calc) (ref <130)
Total CHOL/HDL Ratio: 2.3 (calc) (ref <5.0)
Triglycerides: 50 mg/dL (ref <150)

## 2019-03-10 LAB — MAGNESIUM: Magnesium: 1.9 mg/dL (ref 1.5–2.5)

## 2019-03-10 LAB — TSH: TSH: 1.02 mIU/L (ref 0.40–4.50)

## 2019-03-10 LAB — INSULIN, RANDOM: Insulin: 11.3 u[IU]/mL

## 2019-03-10 LAB — MICROALBUMIN / CREATININE URINE RATIO
Creatinine, Urine: 88 mg/dL (ref 20–275)
Microalb Creat Ratio: 3 mcg/mg creat (ref <30)
Microalb, Ur: 0.3 mg/dL

## 2019-03-10 LAB — VITAMIN D 25 HYDROXY (VIT D DEFICIENCY, FRACTURES): Vit D, 25-Hydroxy: 89 ng/mL (ref 30–100)

## 2019-03-27 ENCOUNTER — Other Ambulatory Visit: Payer: Self-pay | Admitting: *Deleted

## 2019-03-27 DIAGNOSIS — Z1211 Encounter for screening for malignant neoplasm of colon: Secondary | ICD-10-CM | POA: Diagnosis not present

## 2019-03-27 DIAGNOSIS — Z1212 Encounter for screening for malignant neoplasm of rectum: Secondary | ICD-10-CM | POA: Diagnosis not present

## 2019-03-27 LAB — POC HEMOCCULT BLD/STL (HOME/3-CARD/SCREEN)
Card #2 Fecal Occult Blod, POC: NEGATIVE
Card #3 Fecal Occult Blood, POC: NEGATIVE
Fecal Occult Blood, POC: NEGATIVE

## 2019-06-16 ENCOUNTER — Ambulatory Visit: Payer: Medicare Other | Admitting: Adult Health

## 2019-07-30 DIAGNOSIS — H0289 Other specified disorders of eyelid: Secondary | ICD-10-CM | POA: Diagnosis not present

## 2019-07-30 DIAGNOSIS — H04123 Dry eye syndrome of bilateral lacrimal glands: Secondary | ICD-10-CM | POA: Diagnosis not present

## 2019-07-30 DIAGNOSIS — Z961 Presence of intraocular lens: Secondary | ICD-10-CM | POA: Diagnosis not present

## 2019-08-11 DIAGNOSIS — Z1231 Encounter for screening mammogram for malignant neoplasm of breast: Secondary | ICD-10-CM | POA: Diagnosis not present

## 2019-08-11 LAB — HM MAMMOGRAPHY

## 2019-08-28 ENCOUNTER — Other Ambulatory Visit: Payer: Self-pay | Admitting: Internal Medicine

## 2019-09-23 NOTE — Progress Notes (Signed)
MEDICARE ANNUAL WELLNESS VISIT AND FOLLOW UP  Assessment:   Diagnoses and all orders for this visit:  Encounter for Medicare annual wellness exam [place]  Labile hypertension (2000) At goal today; managed by lifestyle only Monitor blood pressure at home; call if consistently over 130/80 Continue DASH diet.   Reminder to go to the ER if any CP, SOB, nausea, dizziness, severe HA, changes vision/speech, left arm numbness and tingling and jaw pain.  Hyperlipidemia At goal; continue with red yeast rice supplement Continue low cholesterol diet and exercise.  Check lipid panel biannually  Atherosclerosis of aorta (HCC) Control blood pressure, lipids and glucose Disscused lifestyle modifications, diet & exercise Continue to monitor  Hypothyroidism, unspecified type Taking levothyroxine 50 mcg daily Reminder to take on an empty stomach 30-46mins before first meal of the day. No antacid medications for 4 hours.  Diverticulosis Increase fiber Bbowel management discussed Continue to monitor  Vitamin D deficiency At goal at recent check; continue to recommend supplementation for goal of 60-100 Defer vitamin D level  History of breast cancer,  S/P mastectomy, annual MMGs, has been released by oncology 07/2019  Abnormal Glucose (06/2009) Discussed disease and risks Discussed diet/exercise, weight management  A1C yearly; has been stable  Medication management Continued   Overweight, BMI 29 Long discussion about weight loss, diet, and exercise Recommended diet heavy in fruits and veggies and low in animal meats, cheeses, and dairy products, appropriate calorie intake Patient will work on portions, cutting down on carbs Discussed appropriate weight for height and her goal (<145lb) Follow up at next visit   Over 40 minutes of face to face interview, exam, counseling, chart review and critical decision making was performed.  Future Appointments  Date Time Provider  [place]  03/15/2020 11:00 AM [name], MD [facility] None  09/26/2020 10:30 AM Garnet Sierras, NP [facility] None     Plan:   During the course of the visit the patient was educated and counseled about appropriate screening and preventive services including:    Pneumococcal vaccine   Prevnar 13  Influenza vaccine  Td vaccine  Screening electrocardiogram  Bone densitometry screening  Colorectal cancer screening  Diabetes screening  Glaucoma screening  Nutrition counseling   Advanced directives: requested   Subjective:  Robin Huynh is a 85 y.o. female who presents for Medicare Annual Wellness Visit and 6 month follow up.    She has hx/o R Mastectomy,  Rt Breast DCIS (ER+), 2013,  and is followed by Dr Lindi Adie, is finished up a 5 year course in 2019 of Tamoxifen with no recurrence by annual mammograms. Has been released.  Continues yearly mammograms.  BMI is Body mass index is 29.1 kg/m., she has been working on diet and exercise. She has been focusing on fruits and vegetables and has been cutting down on carbohydrates. She walks 30-60 min most days.  She is down 7lbs since last OV.  Wt Readings from Last 3 Encounters:  09/24/19 149 lb (67.6 kg)  03/09/19 156 lb (70.8 kg)  03/06/19 157 lb (71.2 kg)   HTN predates 2000  Her blood pressure has been controlled at home, today their BP is BP: 130/76 She does workout. She denies chest pain, shortness of breath, dizziness.   She is not on cholesterol medication (on red yeast rice and Omega3 2,000mg  daily. Her cholesterol is at goal. The cholesterol last visit was:   Lab Results  Component Value Date   CHOL 223 (H) 09/24/2019   HDL 79  09/24/2019   LDLCALC 129 (H) 09/24/2019   TRIG 49 09/24/2019   CHOLHDL 2.8 09/24/2019    She has been working on diet and exercise for prediabetes/abnormal glucose, (A1c 6.1%, 2011 & 6.4%, 2012) and denies foot ulcerations, hyperglycemia, increased appetite, nausea,  paresthesia of the feet, polydipsia, polyuria, visual disturbances, vomiting and weight loss. Last A1C in the office was:  Lab Results  Component Value Date   HGBA1C 6.0 (H) 03/09/2019   She is on thyroid medication and taking levothyroxine 77mcg daily.Marland Kitchen Her medication was not changed last visit.   Lab Results  Component Value Date   TSH 0.28 (L) 09/24/2019  . Last GFR: Lab Results  Component Value Date   GFRAA 83 09/24/2019   Patient is on Vitamin D supplement, 6,000IU daily, and at goal last check:    Lab Results  Component Value Date   VD25OH 89 03/09/2019      Medication Review: Current Outpatient Medications on File Prior to Visit  Medication Sig Dispense Refill  . aspirin 81 MG tablet Take 81 mg by mouth daily.    Marland Kitchen CALCIUM PO Take 1 tablet by mouth daily.    . Cholecalciferol (VITAMIN D) 2000 UNITS tablet Take 2,000 Units by mouth 3 (three) times daily.    . fish oil-omega-3 fatty acids 1000 MG capsule Take 2 g by mouth daily.    Marland Kitchen levothyroxine (EUTHYROX) 50 MCG tablet Take 1 tablet daily on an empty stomach with only water for 30 minutes & no Antacid meds, Calcium or Magnesium for 4 hours & avoid Biotin 90 tablet 0  . magnesium 30 MG tablet Take 30 mg by mouth 2 (two) times daily.    . Multiple Vitamin (MULTIVITAMIN) tablet Take 1 tablet by mouth daily.    . Red Yeast Rice 600 MG CAPS Take 600 mg by mouth daily.     . tizanidine (ZANAFLEX) 2 MG capsule Take 1 capsule (2 mg total) by mouth 3 (three) times daily as needed for muscle spasms. 30 capsule 0  . vitamin C (ASCORBIC ACID) 500 MG tablet Take 500 mg by mouth daily.     No current facility-administered medications on file prior to visit.    No Known Allergies  Current Problems (verified) Patient Active Problem List   Diagnosis Date Noted  . Obesity (BMI 30.0-34.9) 05/15/2018  . Encounter for Medicare annual wellness exam 10/07/2014  . Hyperlipidemia 07/15/2013  . Medication management 06/30/2013  . Labile  hypertension (2000)   . Hypothyroidism   . Other abnormal glucose (prediabetes)   . Vitamin D deficiency   . Diverticulosis   . Primary cancer of lower outer quadrant of right female breast ([place]) 06/21/2011    Screening Tests Immunization History  Administered Date(s) Administered  . Influenza, High Dose Seasonal PF 11/25/2018  . PFIZER SARS-COV-2 Vaccination 01/21/2019, 02/10/2019  . Pneumococcal Conjugate-13 03/01/2014  . Pneumococcal Polysaccharide-23 01/09/2007  . Td 05/18/2008    Preventative care: Last colonoscopy: 2014 wants one last follow up - cologuard ordered - never polyps Last mammogram: 07/2018 DEXA: 07/07/14 - normal - wants to defer PAP 2012 US thyroid with bx 02/2015 negative  Prior vaccinations: TD or Tdap: 2010 defer  Influenza: Declines Pneumococcal: 2009 Prevnar13: 2010 Shingles/Zostavax: 2010  Names of Other Physician/Practitioners you currently use: 1. [facility]- here for primary care 2. [facility], eye doctor, last visit 2021, goes q66m - has left cataract - surgery postponed due to virus but doing well  3. Dr.  Toy Cookey, dentist, last visit 2021, goes q67m  Patient Care Team: [name], MD as PCP - General (Internal Medicine) Nicholas Lose, MD as Consulting Physician (Hematology and Oncology) Sable Feil, MD as Consulting Physician (Gastroenterology) Coralie Keens, MD as Consulting Physician (General Surgery)  SURGICAL HISTORY She  has a past surgical history that includes Breast surgery; Colonoscopy; Mastectomy complete / simple w/ sentinel node biopsy (08/10/11); and Mastectomy w/ sentinel node biopsy (08/10/2011). FAMILY HISTORY Her family history includes Colon cancer (age of onset: 9) in her mother; Kidney cancer in her brother; Lung cancer in her brother and sister; Prostate cancer in her father. SOCIAL HISTORY She  reports that she has never smoked. She has never used smokeless  tobacco. She reports that she does not drink alcohol and does not use drugs.   MEDICARE WELLNESS OBJECTIVES: Physical activity: Current Exercise Habits: Home exercise routine, Time (Minutes): 30, Frequency (Times/Week): 4, Weekly Exercise (Minutes/Week): 120, Intensity: Mild, Exercise limited by: None identified Cardiac risk factors: Cardiac Risk Factors include: advanced age (>44men, >93 women);dyslipidemia Depression/mood screen:   Depression screen [facility] 2/9 09/24/2019  Decreased Interest 0  Down, Depressed, Hopeless 0  PHQ - 2 Score 0    ADLs:  In your present state of health, do you have any difficulty performing the following activities: 09/24/2019 03/08/2019  Hearing? N N  Vision? N N  Difficulty concentrating or making decisions? N N  Walking or climbing stairs? N N  Dressing or bathing? N N  Doing errands, shopping? N N  Preparing Food and eating ? N -  Using the Toilet? N -  In the past six months, have you accidently leaked urine? N -  Do you have problems with loss of bowel control? N -  Managing your Medications? N -  Managing your Finances? N -  Housekeeping or managing your Housekeeping? N -  Some recent data might be hidden     Cognitive Testing  Alert? Yes  Normal Appearance? n/a  Oriented to person? Yes  Place? Yes   Time? Yes  Recall of three objects?  Yes  Can perform simple calculations? Yes  Displays appropriate judgment?Yes  Can read the correct time from a watch face? n/a  EOL planning: Does Patient Have a Medical Advance Directive?: No Would patient like information on creating a medical advance directive?: Yes (MAU/Ambulatory/Procedural Areas - Information given)  Review of Systems  Constitutional: Negative for chills, diaphoresis, fever, malaise/fatigue and weight loss.  HENT: Negative for congestion, ear discharge, ear pain, hearing loss, nosebleeds, sinus pain, sore throat and tinnitus.   Eyes: Negative for blurred vision, double vision,  photophobia, pain, discharge and redness.  Respiratory: Negative for cough, hemoptysis, sputum production, shortness of breath, wheezing and stridor.   Cardiovascular: Negative for chest pain, palpitations, orthopnea, claudication, leg swelling and PND.  Gastrointestinal: Negative for abdominal pain, blood in stool, constipation, diarrhea, heartburn, melena, nausea and vomiting.  Genitourinary: Negative for dysuria, flank pain, frequency, hematuria and urgency.  Musculoskeletal: Negative for back pain, falls, joint pain, myalgias and neck pain.  Skin: Negative for itching and rash.  Neurological: Negative for dizziness, tingling, tremors, sensory change, speech change, focal weakness, seizures, loss of consciousness, weakness and headaches.  Endo/Heme/Allergies: Negative for environmental allergies and polydipsia. Does not bruise/bleed easily.  Psychiatric/Behavioral: Negative for depression, hallucinations, memory loss, substance abuse and suicidal ideas. The patient is not nervous/anxious and does not have insomnia.      Objective:     Today's Vitals  09/24/19 1048  BP: 130/76  Pulse: 63  Temp: (!) 97.5 F (36.4 C)  SpO2: 97%  Weight: 149 lb (67.6 kg)  PainSc: 0-No pain   Body mass index is 29.1 kg/m. General Appearance: Well nourished, in no apparent distress. Eyes: PERRLA, EOMs, conjunctiva no swelling or erythema Sinuses: No Frontal/maxillary tenderness ENT/Mouth: Ext aud canals clear, TMs without erythema, bulging. No erythema, swelling, or exudate on post pharynx.  Tonsils not swollen or erythematous. Hearing normal.  Neck: Supple, thyroid normal.  Respiratory: Respiratory effort normal, BS equal bilaterally without rales, rhonchi, wheezing or stridor.  Cardio: RRR with no MRGs. Brisk peripheral pulses without edema.  Abdomen: Soft, + BS.  Non tender, no guarding, rebound, hernias, masses. Lymphatics: Non tender without lymphadenopathy.  Musculoskeletal: Full ROM, 5/5  strength, Normal gait Skin: Warm, dry without rashes, lesions, ecchymosis.  Neuro: Cranial nerves intact. No cerebellar symptoms.  Psych: Awake and oriented X 3, normal affect, Insight and Judgment appropriate.  Medicare Attestation I have personally reviewed: The patient's medical and social history Their use of alcohol, tobacco or illicit drugs Their current medications and supplements The patient's functional ability including ADLs,fall risks, home safety risks, cognitive, and hearing and visual impairment Diet and physical activities Evidence for depression or mood disorders  The patient's weight, height, BMI, and visual acuity have been recorded in the chart.  I have made referrals, counseling, and provided education to the patient based on review of the above and I have provided the patient with a written personalized care plan for preventive services.     Garnet Sierras, NP   09/25/2019

## 2019-09-24 ENCOUNTER — Other Ambulatory Visit: Payer: Self-pay

## 2019-09-24 ENCOUNTER — Encounter: Payer: Self-pay | Admitting: Adult Health Nurse Practitioner

## 2019-09-24 ENCOUNTER — Ambulatory Visit: Payer: Medicare PPO | Admitting: Adult Health Nurse Practitioner

## 2019-09-24 VITALS — BP 130/76 | HR 63 | Temp 97.5°F | Wt 149.0 lb

## 2019-09-24 DIAGNOSIS — I700 Atherosclerosis of aorta: Secondary | ICD-10-CM

## 2019-09-24 DIAGNOSIS — E782 Mixed hyperlipidemia: Secondary | ICD-10-CM

## 2019-09-24 DIAGNOSIS — Z853 Personal history of malignant neoplasm of breast: Secondary | ICD-10-CM | POA: Diagnosis not present

## 2019-09-24 DIAGNOSIS — Z0001 Encounter for general adult medical examination with abnormal findings: Secondary | ICD-10-CM | POA: Diagnosis not present

## 2019-09-24 DIAGNOSIS — K5790 Diverticulosis of intestine, part unspecified, without perforation or abscess without bleeding: Secondary | ICD-10-CM | POA: Diagnosis not present

## 2019-09-24 DIAGNOSIS — R7309 Other abnormal glucose: Secondary | ICD-10-CM

## 2019-09-24 DIAGNOSIS — R0989 Other specified symptoms and signs involving the circulatory and respiratory systems: Secondary | ICD-10-CM

## 2019-09-24 DIAGNOSIS — R6889 Other general symptoms and signs: Secondary | ICD-10-CM | POA: Diagnosis not present

## 2019-09-24 DIAGNOSIS — E039 Hypothyroidism, unspecified: Secondary | ICD-10-CM | POA: Diagnosis not present

## 2019-09-24 DIAGNOSIS — Z0000 Encounter for general adult medical examination without abnormal findings: Secondary | ICD-10-CM

## 2019-09-24 DIAGNOSIS — Z79899 Other long term (current) drug therapy: Secondary | ICD-10-CM

## 2019-09-24 DIAGNOSIS — E559 Vitamin D deficiency, unspecified: Secondary | ICD-10-CM | POA: Diagnosis not present

## 2019-09-24 NOTE — Patient Instructions (Signed)
Robin Huynh , Thank you for taking time to come for your Medicare Wellness Visit. I appreciate your ongoing commitment to your health goals. Please review the following plan we discussed and let me know if I can assist you in the future.   All of your health maintenace items are up to date.  These are the goals we discussed: Goals    . DIET - INCREASE WATER INTAKE     4-6 bottles water    . Weight (lb) < 145 lb (65.8 kg)       This is a list of the screening recommended for you and due dates:  Health Maintenance  Topic Date Due  . Tetanus Vaccine  05/19/2018  . Mammogram  08/08/2019  . Flu Shot  08/09/2019  . DEXA scan (bone density measurement)  Completed  . COVID-19 Vaccine  Completed  . Pneumonia vaccines  Completed   You are due for a tetanus vaccination.   Please contact our office if you cut yourself on any metal so we can administer this vaccine to you to prevent tetanus.      Aim for 7+ servings of fruits and vegetables daily   65+ fluid ounces of water for healthy kidneys   Limit animal fats in diet for cholesterol and heart health - choose grass fed whenever available   Aim for low stress - take time to unwind and care for your mental health   Aim for 150 min of moderate intensity exercise weekly for heart health, and weights twice weekly for bone health   Aim for 7-9 hours of sleep daily          When it comes to diets, agreement about the perfect plan isn't easy to find, even among the experts. Experts at the [employer] developed an idea known as the Healthy Eating Plate. Just imagine a plate divided into logical, healthy portions.   The emphasis is on diet quality:   Load up on vegetables and fruits - one-half of your plate: Aim for color and variety, and remember that white potatoes are high starch and should be consumed in moderation.     Go for whole grains - one-quarter of your plate: Whole wheat, barley, wheat berries,  quinoa, oats, brown rice, and foods made with them. If you want pasta, go with whole wheat pasta as serving sized noted on package.   Protein power - one-quarter of your plate: Fish, chicken, beans, and nuts are all healthy, versatile protein sources. Limit red meat.   The diet, however, does go beyond the plate, offering a few other suggestions.   Use healthy plant oils, such as olive, canola, soy, corn, sunflower and peanut. Check the labels, and avoid partially hydrogenated oil, which have unhealthy trans fats.  This will help your cholesterol and heart health.   If you're thirsty, drink water.  Aim for 80 oz of water a day.  Coffee and tea are ok in moderation, but skip sugary drinks (juices, sodas, sweet tea) and limit milk and dairy products to one or two daily servings.   The type of carbohydrate in the diet is more important than the amount. Some sources of carbohydrates, such as vegetables, fruits, whole grains, and beans--are healthier than others.  If you are tech-savvy, check out [employer] for tips for replacing current unhealthy food choices, eating healthy on a budget, recipes and more.   Finally, stay active! Walking is an activity, involve family and friends.  It is FREE and maintains and or improves your health.

## 2019-09-25 LAB — COMPLETE METABOLIC PANEL WITH GFR
AG Ratio: 1.3 (calc) (ref 1.0–2.5)
ALT: 7 U/L (ref 6–29)
AST: 23 U/L (ref 10–35)
Albumin: 3.9 g/dL (ref 3.6–5.1)
Alkaline phosphatase (APISO): 72 U/L (ref 37–153)
BUN: 19 mg/dL (ref 7–25)
CO2: 28 mmol/L (ref 20–32)
Calcium: 9.7 mg/dL (ref 8.6–10.4)
Chloride: 103 mmol/L (ref 98–110)
Creat: 0.77 mg/dL (ref 0.60–0.88)
GFR, Est African American: 83 mL/min/{1.73_m2} (ref >=60)
GFR, Est Non African American: 71 mL/min/{1.73_m2} (ref >=60)
Globulin: 3.1 g/dL (calc) (ref 1.9–3.7)
Glucose, Bld: 92 mg/dL (ref 65–99)
Potassium: 4 mmol/L (ref 3.5–5.3)
Sodium: 138 mmol/L (ref 135–146)
Total Bilirubin: 0.4 mg/dL (ref 0.2–1.2)
Total Protein: 7 g/dL (ref 6.1–8.1)

## 2019-09-25 LAB — CBC WITH DIFFERENTIAL/PLATELET
Absolute Monocytes: 645 cells/uL (ref 200–950)
Basophils Absolute: 31 cells/uL (ref 0–200)
Basophils Relative: 0.6 %
Eosinophils Absolute: 73 cells/uL (ref 15–500)
Eosinophils Relative: 1.4 %
HCT: 38.8 % (ref 35.0–45.0)
Hemoglobin: 12.6 g/dL (ref 11.7–15.5)
Lymphs Abs: 1856 cells/uL (ref 850–3900)
MCH: 28 pg (ref 27.0–33.0)
MCHC: 32.5 g/dL (ref 32.0–36.0)
MCV: 86.2 fL (ref 80.0–100.0)
MPV: 12.4 fL (ref 7.5–12.5)
Monocytes Relative: 12.4 %
Neutro Abs: 2595 cells/uL (ref 1500–7800)
Neutrophils Relative %: 49.9 %
Platelets: 150 10*3/uL (ref 140–400)
RBC: 4.5 10*6/uL (ref 3.80–5.10)
RDW: 12.9 % (ref 11.0–15.0)
Total Lymphocyte: 35.7 %
WBC: 5.2 10*3/uL (ref 3.8–10.8)

## 2019-09-25 LAB — LIPID PANEL
Cholesterol: 223 mg/dL — ABNORMAL HIGH (ref <200)
HDL: 79 mg/dL (ref >=50)
LDL Cholesterol (Calc): 129 mg/dL (calc) — ABNORMAL HIGH
Non-HDL Cholesterol (Calc): 144 mg/dL (calc) — ABNORMAL HIGH (ref <130)
Total CHOL/HDL Ratio: 2.8 (calc) (ref <5.0)
Triglycerides: 49 mg/dL (ref <150)

## 2019-09-25 LAB — TSH: TSH: 0.28 mIU/L — ABNORMAL LOW (ref 0.40–4.50)

## 2019-10-27 ENCOUNTER — Encounter: Payer: Self-pay | Admitting: *Deleted

## 2019-11-25 ENCOUNTER — Other Ambulatory Visit: Payer: Self-pay

## 2019-11-25 ENCOUNTER — Ambulatory Visit (INDEPENDENT_AMBULATORY_CARE_PROVIDER_SITE_OTHER): Payer: Medicare PPO | Admitting: *Deleted

## 2019-11-25 VITALS — Temp 97.4°F

## 2019-11-25 DIAGNOSIS — Z23 Encounter for immunization: Secondary | ICD-10-CM

## 2019-12-16 ENCOUNTER — Other Ambulatory Visit: Payer: Self-pay | Admitting: Internal Medicine

## 2019-12-16 MED ORDER — LEVOTHYROXINE SODIUM 50 MCG PO TABS: ORAL_TABLET | ORAL | 0 refills | Status: DC

## 2020-03-14 ENCOUNTER — Encounter: Payer: Self-pay | Admitting: Internal Medicine

## 2020-03-14 ENCOUNTER — Other Ambulatory Visit: Payer: Self-pay | Admitting: Internal Medicine

## 2020-03-14 DIAGNOSIS — I700 Atherosclerosis of aorta: Secondary | ICD-10-CM | POA: Insufficient documentation

## 2020-03-14 DIAGNOSIS — E039 Hypothyroidism, unspecified: Secondary | ICD-10-CM

## 2020-03-14 MED ORDER — LEVOTHYROXINE SODIUM 50 MCG PO TABS: ORAL_TABLET | ORAL | 1 refills | Status: DC

## 2020-03-14 NOTE — Patient Instructions (Signed)

## 2020-03-14 NOTE — Progress Notes (Signed)
Annual Screening/Preventative Visit & Comprehensive Evaluation &  Examination      This very nice 86 y.o. MBF presents for a Screening /Preventative Visit & comprehensive evaluation and management of multiple medical co-morbidities.  Patient has been followed for labile HTN, HLD, Prediabetes  and Vitamin D Deficiency.  Patient is followed by Dr Lindi Adie for hx/o Rt Breast Cancer in 2013.       Labile HTN predates circa 2000. Patient's BP has been controlled at home and patient denies any cardiac symptoms as chest pain, palpitations, shortness of breath, dizziness or ankle swelling. Today's BP is at goal -  128/78.       Patient's hyperlipidemia is not controlled with diet. Last lipids were not at goal:  Lab Results  Component Value Date   CHOL 223 (H) 09/24/2019   HDL 79 09/24/2019   LDLCALC 129 (H) 09/24/2019   TRIG 49 09/24/2019   CHOLHDL 2.8 09/24/2019       Patient has hx/o prediabetes (A1c 6.1% /2011&6.4% /2012)and patient denies reactive hypoglycemic symptoms, visual blurring, diabetic polys or paresthesias. Last A1c was not at goal:  Lab Results  Component Value Date   HGBA1C 6.0 (H) 03/09/2019       Patient was dx'd with Goiter & Hypothyroidism in 2011 & was initiated on replacement therapy.     Finally, patient has history of Vitamin D Deficiency and last Vitamin D was at goal:  Lab Results  Component Value Date   VD25OH 89 03/09/2019    Current Outpatient Medications on File Prior to Visit  Medication Sig  . aspirin 81 MG tablet Take  daily.  Marland Kitchen CALCIUM PO Take 1 tablet  daily.  Marland Kitchen VITAMIN D 2000 UNITS  Take 3times daily.  . fish oil-omega-3  1000 MG capsule Take 2 g  daily.  Marland Kitchen levothyroxine 50 MCG tablet Take      1 tablet      Daily   . magnesium 30 MG tablet Take  2 times daily.  . Multiple Vitamin  Take 1 tablet daily.  . Red Yeast Rice 600 MG CAPS Take 600 mg  daily.   . vitamin C  500 MG tablet Take 500 mg daily.    No Known Allergies  Past  Medical History:  Diagnosis Date  . Breast cancer ([place]) 08/2011   right  . Diverticulosis   . Hypothyroidism   . Labile hypertension   . Prediabetes   . Thoracic aorta atherosclerosis ([place])    via CXR  . Vitamin D deficiency     Health Maintenance  Topic Date Due  . TETANUS/TDAP  05/19/2018  . COVID-19 Vaccine (3 - Pfizer risk 4-dose series) 03/10/2019  . MAMMOGRAM  08/10/2020  . INFLUENZA VACCINE  Completed  . DEXA SCAN  Completed  . PNA vac Low Risk Adult  Completed  . HPV VACCINES  Aged Out    Immunization History  Administered Date(s) Administered  . Influenza, High Dose Seasonal PF 11/25/2018, 11/25/2019  . PFIZER(Purple Top)SARS-COV-2 Vaccination 01/21/2019, 02/10/2019  . Pneumococcal Conjugate-13 03/01/2014  . Pneumococcal Polysaccharide-23 01/09/2007  . Td 05/18/2008    Last Colon - 08/18/2012 - Dr Sharlett Iles - hyperplastic polyp - recc 10 yr f/u  In Aug 2024 (& patient would be 85 yo)  Last MGM - 08/11/2019  Past Surgical History:  Procedure Laterality Date  . BREAST SURGERY     right breast  . COLONOSCOPY    . MASTECTOMY COMPLETE / SIMPLE W/ SENTINEL NODE BIOPSY  08/10/11   right  . MASTECTOMY W/ SENTINEL NODE BIOPSY  08/10/2011   Procedure: MASTECTOMY WITH SENTINEL LYMPH NODE BIOPSY;  Surgeon: Harl Bowie, MD;  Location: [facility];  Service: General;  Laterality: Right;    Family History  Problem Relation Age of Onset  . Colon cancer Mother 41  . Prostate cancer Father   . Lung cancer Brother   . Lung cancer Sister   . Kidney cancer Brother     Social History   Tobacco Use  . Smoking status: Never Smoker  . Smokeless tobacco: Never Used  Substance Use Topics  . Alcohol use: No  . Drug use: No    ROS Constitutional: Denies fever, chills, weight loss/gain, headaches, insomnia,  night sweats, and change in appetite. Does c/o fatigue. Eyes: Denies redness, blurred vision, diplopia, discharge, itchy, watery eyes.  ENT: Denies discharge,  congestion, post nasal drip, epistaxis, sore throat, earache, hearing loss, dental pain, Tinnitus, Vertigo, Sinus pain, snoring.  Cardio: Denies chest pain, palpitations, irregular heartbeat, syncope, dyspnea, diaphoresis, orthopnea, PND, claudication, edema Respiratory: denies cough, dyspnea, DOE, pleurisy, hoarseness, laryngitis, wheezing.  Gastrointestinal: Denies dysphagia, heartburn, reflux, water brash, pain, cramps, nausea, vomiting, bloating, diarrhea, constipation, hematemesis, melena, hematochezia, jaundice, hemorrhoids Genitourinary: Denies dysuria, frequency, urgency, nocturia, hesitancy, discharge, hematuria, flank pain Breast: Breast lumps, nipple discharge, bleeding.  Musculoskeletal: Denies arthralgia, myalgia, stiffness, Jt. Swelling, pain, limp, and strain/sprain. Denies falls. Skin: Denies puritis, rash, hives, warts, acne, eczema, changing in skin lesion Neuro: No weakness, tremor, incoordination, spasms, paresthesia, pain Psychiatric: Denies confusion, memory loss, sensory loss. Denies Depression. Endocrine: Denies change in weight, skin, hair change, nocturia, and paresthesia, diabetic polys, visual blurring, hyper / hypo glycemic episodes.  Heme/Lymph: No excessive bleeding, bruising, enlarged lymph nodes.  Physical Exam  BP 128/78   Pulse 61   Temp (!) 97.3 F (36.3 C)   Resp 16   Ht 5' (1.524 m)   Wt 148 lb 3.2 oz (67.2 kg)   SpO2 99%   BMI 28.94 kg/m   General Appearance: Well nourished, well groomed and in no apparent distress.  Eyes: PERRLA, EOMs, conjunctiva no swelling or erythema, normal fundi and vessels. Sinuses: No frontal/maxillary tenderness ENT/Mouth: EACs patent / TMs  nl. Nares clear without erythema, swelling, mucoid exudates. Oral hygiene is good. No erythema, swelling, or exudate. Tongue normal, non-obstructing. Tonsils not swollen or erythematous. Hearing normal.  Neck: Supple, thyroid not palpable. No bruits, nodes or JVD. Respiratory:  Respiratory effort normal.  BS equal and clear bilateral without rales, rhonci, wheezing or stridor. Cardio: Heart sounds are normal with regular rate and rhythm and no murmurs, rubs or gallops. Peripheral pulses are normal and equal bilaterally without edema. No aortic or femoral bruits. Chest: symmetric with normal excursions and percussion. Breasts: Symmetric, without lumps, nipple discharge, retractions, or fibrocystic changes.  Abdomen: Flat, soft with bowel sounds active. Nontender, no guarding, rebound, hernias, masses, or organomegaly.  Lymphatics: Non tender without lymphadenopathy.  Genitourinary:  Musculoskeletal: Full ROM all peripheral extremities, joint stability, 5/5 strength, and normal gait. Skin: Warm and dry without rashes, lesions, cyanosis, clubbing or  ecchymosis.  Neuro: Cranial nerves intact, reflexes equal bilaterally. Normal muscle tone, no cerebellar symptoms. Sensation intact.  Pysch: Alert and oriented X 3, normal affect, Insight and Judgment appropriate.   Assessment and Plan  1. Annual Preventative Screening Examination   2. Labile hypertension  - EKG 12-Lead - Urinalysis, Routine w reflex microscopic - Microalbumin / creatinine urine ratio - CBC with Differential/Platelet -  COMPLETE METABOLIC PANEL WITH GFR - Magnesium - TSH  3. Hyperlipidemia, mixed  - EKG 12-Lead - Lipid panel - TSH  4. Abnormal glucose  - EKG 12-Lead - Hemoglobin A1c  5. Vitamin D deficiency  - VITAMIN D 25 Hydroxy  6. Hypothyroidism  - TSH  7. Prediabetes  - EKG 12-Lead - Hemoglobin A1c - Insulin, random  8. Aortic atherosclerosis by CXR 04/17/2013  - EKG 12-Lead  9. Screening for colorectal cancer  - POC Hemoccult Bld/Stl   10. Screening for ischemic heart disease  - EKG 12-Lead  11. Medication management  - Urinalysis, Routine w reflex microscopic - Microalbumin / creatinine urine ratio - CBC with Differential/Platelet - COMPLETE METABOLIC  PANEL WITH GFR - Magnesium - Lipid panel - TSH - Hemoglobin A1c - Insulin, random - VITAMIN D 25 Hydroxy        Patient was counseled in prudent diet to achieve/maintain BMI less than 25 for weight control, BP monitoring, regular exercise and medications. Discussed med's effects and SE's. Screening labs and tests as requested with regular follow-up as recommended. Over 40 minutes of exam, counseling, chart review and high complex critical decision making was performed.   Kirtland Bouchard, MD

## 2020-03-15 ENCOUNTER — Ambulatory Visit: Payer: Medicare PPO | Admitting: Internal Medicine

## 2020-03-15 ENCOUNTER — Other Ambulatory Visit: Payer: Self-pay

## 2020-03-15 VITALS — BP 128/78 | HR 61 | Temp 97.3°F | Resp 16 | Ht 60.0 in | Wt 148.2 lb

## 2020-03-15 DIAGNOSIS — E782 Mixed hyperlipidemia: Secondary | ICD-10-CM

## 2020-03-15 DIAGNOSIS — Z79899 Other long term (current) drug therapy: Secondary | ICD-10-CM | POA: Diagnosis not present

## 2020-03-15 DIAGNOSIS — Z1211 Encounter for screening for malignant neoplasm of colon: Secondary | ICD-10-CM

## 2020-03-15 DIAGNOSIS — Z136 Encounter for screening for cardiovascular disorders: Secondary | ICD-10-CM | POA: Diagnosis not present

## 2020-03-15 DIAGNOSIS — E559 Vitamin D deficiency, unspecified: Secondary | ICD-10-CM

## 2020-03-15 DIAGNOSIS — Z0000 Encounter for general adult medical examination without abnormal findings: Secondary | ICD-10-CM

## 2020-03-15 DIAGNOSIS — R7309 Other abnormal glucose: Secondary | ICD-10-CM | POA: Diagnosis not present

## 2020-03-15 DIAGNOSIS — R0989 Other specified symptoms and signs involving the circulatory and respiratory systems: Secondary | ICD-10-CM | POA: Diagnosis not present

## 2020-03-15 DIAGNOSIS — R7303 Prediabetes: Secondary | ICD-10-CM

## 2020-03-15 DIAGNOSIS — I700 Atherosclerosis of aorta: Secondary | ICD-10-CM

## 2020-03-15 DIAGNOSIS — E039 Hypothyroidism, unspecified: Secondary | ICD-10-CM

## 2020-03-15 DIAGNOSIS — Z1212 Encounter for screening for malignant neoplasm of rectum: Secondary | ICD-10-CM

## 2020-03-15 DIAGNOSIS — Z0001 Encounter for general adult medical examination with abnormal findings: Secondary | ICD-10-CM

## 2020-03-16 LAB — LIPID PANEL
Cholesterol: 219 mg/dL — ABNORMAL HIGH (ref <200)
HDL: 85 mg/dL (ref >=50)
LDL Cholesterol (Calc): 118 mg/dL (calc) — ABNORMAL HIGH
Non-HDL Cholesterol (Calc): 134 mg/dL (calc) — ABNORMAL HIGH (ref <130)
Total CHOL/HDL Ratio: 2.6 (calc) (ref <5.0)
Triglycerides: 70 mg/dL (ref <150)

## 2020-03-16 LAB — COMPLETE METABOLIC PANEL WITH GFR
AG Ratio: 1.2 (calc) (ref 1.0–2.5)
ALT: 7 U/L (ref 6–29)
AST: 24 U/L (ref 10–35)
Albumin: 4 g/dL (ref 3.6–5.1)
Alkaline phosphatase (APISO): 72 U/L (ref 37–153)
BUN: 21 mg/dL (ref 7–25)
CO2: 26 mmol/L (ref 20–32)
Calcium: 9.5 mg/dL (ref 8.6–10.4)
Chloride: 104 mmol/L (ref 98–110)
Creat: 0.8 mg/dL (ref 0.60–0.88)
GFR, Est African American: 79 mL/min/{1.73_m2} (ref >=60)
GFR, Est Non African American: 68 mL/min/{1.73_m2} (ref >=60)
Globulin: 3.4 g/dL (calc) (ref 1.9–3.7)
Glucose, Bld: 97 mg/dL (ref 65–99)
Potassium: 4.5 mmol/L (ref 3.5–5.3)
Sodium: 140 mmol/L (ref 135–146)
Total Bilirubin: 0.5 mg/dL (ref 0.2–1.2)
Total Protein: 7.4 g/dL (ref 6.1–8.1)

## 2020-03-16 LAB — URINALYSIS, ROUTINE W REFLEX MICROSCOPIC
Bilirubin Urine: NEGATIVE
Glucose, UA: NEGATIVE
Hgb urine dipstick: NEGATIVE
Ketones, ur: NEGATIVE
Nitrite: NEGATIVE
Protein, ur: NEGATIVE
Specific Gravity, Urine: 1.017 (ref 1.001–1.03)
pH: 5.5 (ref 5.0–8.0)

## 2020-03-16 LAB — MICROSCOPIC MESSAGE

## 2020-03-16 LAB — MICROALBUMIN / CREATININE URINE RATIO
Creatinine, Urine: 117 mg/dL (ref 20–275)
Microalb Creat Ratio: 2 mcg/mg creat (ref <30)
Microalb, Ur: 0.2 mg/dL

## 2020-03-16 LAB — CBC WITH DIFFERENTIAL/PLATELET
Absolute Monocytes: 639 cells/uL (ref 200–950)
Basophils Absolute: 27 cells/uL (ref 0–200)
Basophils Relative: 0.4 %
Eosinophils Absolute: 129 cells/uL (ref 15–500)
Eosinophils Relative: 1.9 %
HCT: 38.5 % (ref 35.0–45.0)
Hemoglobin: 12.6 g/dL (ref 11.7–15.5)
Lymphs Abs: 2237 cells/uL (ref 850–3900)
MCH: 27.9 pg (ref 27.0–33.0)
MCHC: 32.7 g/dL (ref 32.0–36.0)
MCV: 85.4 fL (ref 80.0–100.0)
MPV: 12.4 fL (ref 7.5–12.5)
Monocytes Relative: 9.4 %
Neutro Abs: 3767 cells/uL (ref 1500–7800)
Neutrophils Relative %: 55.4 %
Platelets: 155 10*3/uL (ref 140–400)
RBC: 4.51 10*6/uL (ref 3.80–5.10)
RDW: 13.2 % (ref 11.0–15.0)
Total Lymphocyte: 32.9 %
WBC: 6.8 10*3/uL (ref 3.8–10.8)

## 2020-03-16 LAB — VITAMIN D 25 HYDROXY (VIT D DEFICIENCY, FRACTURES): Vit D, 25-Hydroxy: 90 ng/mL (ref 30–100)

## 2020-03-16 LAB — INSULIN, RANDOM: Insulin: 5.3 u[IU]/mL

## 2020-03-16 LAB — HEMOGLOBIN A1C
Hgb A1c MFr Bld: 5.9 % of total Hgb — ABNORMAL HIGH (ref <5.7)
Mean Plasma Glucose: 123 mg/dL
eAG (mmol/L): 6.8 mmol/L

## 2020-03-16 LAB — TSH: TSH: 0.42 mIU/L (ref 0.40–4.50)

## 2020-03-16 LAB — MAGNESIUM: Magnesium: 2 mg/dL (ref 1.5–2.5)

## 2020-03-16 NOTE — Progress Notes (Signed)
============================================================ -   Test results slightly outside the reference range are not unusual. If there is anything important, I will review this with you,  otherwise it is considered normal test values.  If you have further questions,  please do not hesitate to contact me at the office or via My Chart.  ============================================================ ============================================================  -  Total chol = 219 - elevated (  Ideal or Goal is less than 180  )   - and   - Bad LDL Chol = 118 - Also too high (  Ideal or Goal is less than 70  !  )   - So - Cholesterol is too high                  - Recommend  a much stricter Diet low cholesterol diet  ---------------------------------------------------------------------------------------------------------  - Cholesterol only comes from animal sources  - ie. meat, dairy, egg yolks  - Eat all the vegetables you want.  - Avoid meat, especially red meat - Beef AND Pork .  - Avoid cheese & dairy - milk & ice cream.     - Cheese is the most concentrated form of trans-fats which  is the worst thing to clog up our arteries.   - Veggie cheese is OK which can be found in the fresh  produce section at [employer] or [employer] or [employer] ============================================================ ============================================================  -  A1c = 5.9%  Blood sugar and A1c are still  elevated in the borderline and  early or pre-diabetes range which has the same   300% increased risk for heart attack, stroke, cancer and   alzheimer- type vascular dementia as full blown diabetes.   But the good news is that diet, exercise with  weight loss can cure the early diabetes at this point. ============================================================ ============================================================  -  Vitamin D = 90 - Excellent   ============================================================ ============================================================  -  All Else - CBC - Kidneys - Electrolytes - Liver - Magnesium & Thyroid    - all  Normal / OK ============================================================ ============================================================

## 2020-04-05 ENCOUNTER — Other Ambulatory Visit: Payer: Self-pay

## 2020-04-05 DIAGNOSIS — Z1211 Encounter for screening for malignant neoplasm of colon: Secondary | ICD-10-CM

## 2020-04-05 LAB — POC HEMOCCULT BLD/STL (HOME/3-CARD/SCREEN)
Card #2 Fecal Occult Blod, POC: NEGATIVE
Card #3 Fecal Occult Blood, POC: NEGATIVE
Fecal Occult Blood, POC: NEGATIVE

## 2020-04-06 DIAGNOSIS — Z1212 Encounter for screening for malignant neoplasm of rectum: Secondary | ICD-10-CM | POA: Diagnosis not present

## 2020-04-06 DIAGNOSIS — Z1211 Encounter for screening for malignant neoplasm of colon: Secondary | ICD-10-CM | POA: Diagnosis not present

## 2020-06-15 ENCOUNTER — Ambulatory Visit: Payer: Medicare PPO

## 2020-06-15 NOTE — Progress Notes (Signed)
   Covid-19 Vaccination Clinic  Name:  Robin Huynh    MRN: [id number] DOB: [birth year]  06/15/2020  [name] was observed post Covid-19 immunization for 15 minutes without incident. She was provided with Vaccine Information Sheet and instruction to access the V-Safe system.   [name] was instructed to call 911 with any severe reactions post vaccine: Marland Kitchen Difficulty breathing  . Swelling of face and throat  . A fast heartbeat  . A bad rash all over body  . Dizziness and weakness

## 2020-06-16 ENCOUNTER — Other Ambulatory Visit (HOSPITAL_BASED_OUTPATIENT_CLINIC_OR_DEPARTMENT_OTHER): Payer: Self-pay

## 2020-06-16 MED ORDER — COVID-19 MRNA VAC-TRIS(PFIZER) 30 MCG/0.3ML IM SUSP
INTRAMUSCULAR | 0 refills | Status: DC
  Filled 2020-06-16: qty 0.3, 1d supply, fill #0

## 2020-08-25 DIAGNOSIS — Z1231 Encounter for screening mammogram for malignant neoplasm of breast: Secondary | ICD-10-CM | POA: Diagnosis not present

## 2020-08-25 LAB — HM MAMMOGRAPHY

## 2020-09-23 NOTE — Progress Notes (Signed)
MEDICARE ANNUAL WELLNESS VISIT AND FOLLOW UP  Assessment:   Diagnoses and all orders for this visit:  Encounter for Medicare annual wellness exam [place]  Labile hypertension (2000) Currently on no medication.  Will be monitoring BP at home and keep a log for next 2 weeks .  If BP is running less than 130/80 she can cancel appointment, if not follow up in 2 weeks and will discuss medication Monitor blood pressure at home; call if consistently over 130/80 Continue DASH diet.   Reminder to go to the ER if any CP, SOB, nausea, dizziness, severe HA, changes vision/speech, left arm numbness and tingling and jaw pain. CBC  Hyperlipidemia At goal; continue with red yeast rice supplement Continue low cholesterol diet and exercise.  Check lipid panel biannually CMP  Atherosclerosis of aorta (HCC) Control blood pressure, lipids and glucose Disscused lifestyle modifications, diet & exercise Continue to monitor  Hypothyroidism, unspecified type Taking levothyroxine 50 mcg daily Reminder to take on an empty stomach 30-62mns before first meal of the day. No antacid medications for 4 hours. TSH  Diverticulosis Increase fiber Bbowel management discussed Continue to monitor  Vitamin D deficiency At goal at recent check; continue to recommend supplementation for goal of 60-100 Defer vitamin D level  History of breast cancer,  S/P mastectomy, annual MMGs, has been released by oncology 07/2019 [facility]  Abnormal Glucose (06/2009) Discussed disease and risks Discussed diet/exercise, weight management  A1C yearly; has been stable Microalbumin/creatinine urine ratio  Medication management Continued  Overweight, BMI 28 Long discussion about weight loss, diet, and exercise Recommended diet heavy in fruits and veggies and low in animal meats, cheeses, and dairy products, appropriate calorie intake Patient will work on portions, cutting  down on carbs Has lost 6 pounds since last year, wants to lose 5 more. Follow up at next visit   Over 40 minutes of face to face interview, exam, counseling, chart review and critical decision making was performed.  Future Appointments  Date Time Provider DPort Wing 03/22/2021  2:00 PM MUnk Pinto MD [facility] None  09/27/2021 10:30 AM MMagda Bernheim NP [facility] None     Plan:   During the course of the visit the patient was educated and counseled about appropriate screening and preventive services including:   Pneumococcal vaccine  Prevnar 13 Influenza vaccine Td vaccine Screening electrocardiogram Bone densitometry screening Colorectal cancer screening Diabetes screening Glaucoma screening Nutrition counseling  Advanced directives: requested   Subjective:  Robin SCHILLINGis a 86y.o. female who presents for Medicare Annual Wellness Visit and 6 month follow up.    She has hx/o R Mastectomy,  Rt Breast DCIS (ER+), 2013,  and is followed by Dr GLindi Huynh is finished up a 5 year course in 2019 of Tamoxifen with no recurrence by annual mammograms. Has been released.  Continues yearly mammograms.  BMI is Body mass index is 28.04 kg/m., she has been working on diet and exercise. She has been focusing on fruits and vegetables and has been cutting down on carbohydrates. She walks 30-60 min most days.  She is down 7lbs since last OV.  Wt Readings from Last 3 Encounters:  09/26/20 143 lb 9.6 oz (65.1 kg)  03/15/20 148 lb 3.2 oz (67.2 kg)  09/24/19 149 lb (67.6 kg)   HTN predates 2000  Her blood pressure has been controlled at home, today their BP is BP: (!) 158/68 She does workout. She denies chest pain,  shortness of breath, dizziness.   She is not on cholesterol medication (on red yeast rice and Omega3 2,'000mg'$  daily. Her cholesterol is not at goal. The cholesterol last visit was:   Lab Results  Component Value Date   CHOL 219 (H) 03/15/2020   HDL 85 03/15/2020    LDLCALC 118 (H) 03/15/2020   TRIG 70 03/15/2020   CHOLHDL 2.6 03/15/2020    She has been working on diet and exercise for prediabetes/abnormal glucose, (A1c 6.1%, 2011 & 6.4%, 2012) and denies foot ulcerations, hyperglycemia, increased appetite, nausea, paresthesia of the feet, polydipsia, polyuria, visual disturbances, vomiting and weight loss. Last A1C in the office was:  Lab Results  Component Value Date   HGBA1C 5.9 (H) 03/15/2020   She is on thyroid medication and taking levothyroxine 55mg daily..Robin KitchenHer medication was not changed last visit.   Lab Results  Component Value Date   TSH 0.42 03/15/2020  . Last GFR: Lab Results  Component Value Date   GFRAA 79 03/15/2020   Patient is on Vitamin D supplement, 6,000IU daily, and at goal last check:    Lab Results  Component Value Date   VD25OH 90 03/15/2020      Medication Review: Current Outpatient Medications on File Prior to Visit  Medication Sig Dispense Refill   aspirin 81 MG tablet Take 81 mg by mouth daily.     CALCIUM PO Take 1 tablet by mouth daily.     Cholecalciferol (VITAMIN D) 2000 UNITS tablet Take 2,000 Units by mouth 3 (three) times daily.     fish oil-omega-3 fatty acids 1000 MG capsule Take 2 g by mouth daily.     levothyroxine (EUTHYROX) 50 MCG tablet Take      1 tablet      Daily       on an empty stomach with only water for 30 minutes & no Antacid meds, Calcium or Magnesium for 4 hours & avoid Biotin 90 tablet 1   magnesium 30 MG tablet Take 30 mg by mouth 2 (two) times daily.     Multiple Vitamin (MULTIVITAMIN) tablet Take 1 tablet by mouth daily.     Red Yeast Rice 600 MG CAPS Take 600 mg by mouth daily.      vitamin C (ASCORBIC ACID) 500 MG tablet Take 500 mg by mouth daily.     COVID-19 mRNA Vac-TriS, Pfizer, SUSP injection Inject into the muscle. 0.3 mL 0   No current facility-administered medications on file prior to visit.    Not on File  Current Problems (verified) Patient Active Problem List    Diagnosis Date Noted   Aortic atherosclerosis by CXR 04/17/2013 03/14/2020   Obesity (BMI 30.0-34.9) 05/15/2018   Encounter for Medicare annual wellness exam 10/07/2014   Hyperlipidemia 07/15/2013   Medication management 06/30/2013   Labile hypertension (2000)    Hypothyroidism    Other abnormal glucose (prediabetes)    Vitamin D deficiency    Diverticulosis    Primary cancer of lower outer quadrant of right female breast ([facility] 06/21/2011    Screening Tests Immunization History  Administered Date(s) Administered   Influenza, High Dose Seasonal PF 11/25/2018, 11/25/2019   PFIZER Comirnaty(Gray Top)Covid-19 Tri-Sucrose Vaccine 06/15/2020   PFIZER(Purple Top)SARS-COV-2 Vaccination 01/21/2019, 02/10/2019   Pneumococcal Conjugate-13 03/01/2014   Pneumococcal Polysaccharide-23 01/09/2007   Td 05/18/2008    Preventative care: Last colonoscopy: 2014 wants one last follow up polyps Cologuard 05/28/18 negative due 2023 Last mammogram: 08/2020 [facility] DEXA: 07/07/14 - normal - wants to  defer PAP 2012 US thyroid with bx 02/2015 negative  Prior vaccinations: TD or Tdap: 2010 defer  Influenza: Declines Pneumococcal: 2009 Prevnar13: 2010 Shingles/Zostavax: 2010  Names of Other Physician/Practitioners you currently use: 1. [facility]- here for primary care 2. [facility], eye doctor, last visit 2022 3. Dr.  Toy Cookey, dentist, last visit 2022, goes q57m Patient Care Team: MUnk Pinto MD as PCP - General (Internal Medicine) GNicholas Lose MD as Consulting Physician (Hematology and Oncology) PSable Feil MD as Consulting Physician (Gastroenterology) BCoralie Keens MD as Consulting Physician (General Surgery)  SURGICAL HISTORY She  has a past surgical history that includes Breast surgery; Colonoscopy; Mastectomy complete / simple w/ sentinel node biopsy (08/10/11); and Mastectomy w/ sentinel node biopsy (08/10/2011). FAMILY  HISTORY Her family history includes Colon cancer (age of onset: 776 in her mother; Kidney cancer in her brother; Lung cancer in her brother and sister; Prostate cancer in her father. SOCIAL HISTORY She  reports that she has never smoked. She has never used smokeless tobacco. She reports that she does not drink alcohol and does not use drugs.   MEDICARE WELLNESS OBJECTIVES: Physical activity: Current Exercise Habits: Home exercise routine, Type of exercise: Other - see comments, Time (Minutes): 20, Frequency (Times/Week): 6, Weekly Exercise (Minutes/Week): 120, Intensity: Mild, Exercise limited by: None identified Cardiac risk factors: Cardiac Risk Factors include: advanced age (>584m, >6>10omen);dyslipidemia;hypertension Depression/mood screen:   [facility] 09/26/2020  Decreased Interest 0  Down, Depressed, Hopeless 0  PHQ - 2 Score 0    ADLs:  In your present state of health, do you have any difficulty performing the following activities: 09/26/2020 03/14/2020  Hearing? N N  Vision? N N  Difficulty concentrating or making decisions? N N  Walking or climbing stairs? N N  Dressing or bathing? N N  Doing errands, shopping? N N  Some recent data might be hidden     Cognitive Testing  Alert? Yes  Normal Appearance? n/a  Oriented to person? Yes  Place? Yes   Time? Yes  Recall of three objects?  Yes  Can perform simple calculations? Yes  Displays appropriate judgment?Yes  Can read the correct time from a watch face? n/a  EOL planning: Does Patient Have a Medical Advance Directive?: Yes Type of Advance Directive: Healthcare Power of Attorney, Living will Does patient want to make changes to medical advance directive?: No - Patient declined [place] of [place]?: No - copy requested  Review of Systems  Constitutional:  Negative for chills, diaphoresis, fever, malaise/fatigue and weight loss.  HENT:  Negative for congestion, ear discharge, ear  pain, hearing loss, nosebleeds, sinus pain, sore throat and tinnitus.   Eyes:  Negative for blurred vision, double vision, photophobia, pain, discharge and redness.  Respiratory:  Negative for cough, hemoptysis, sputum production, shortness of breath, wheezing and stridor.   Cardiovascular:  Negative for chest pain, palpitations, orthopnea, claudication, leg swelling and PND.  Gastrointestinal:  Negative for abdominal pain, blood in stool, constipation, diarrhea, heartburn, melena, nausea and vomiting.  Genitourinary:  Negative for dysuria, flank pain, frequency, hematuria and urgency.  Musculoskeletal:  Negative for back pain, falls, joint pain, myalgias and neck pain.  Skin:  Negative for itching and rash.  Neurological:  Negative for dizziness, tingling, tremors, sensory change, speech change, focal weakness, seizures, loss of consciousness, weakness and headaches.  Endo/Heme/Allergies:  Negative for environmental allergies and polydipsia. Does not bruise/bleed easily.  Psychiatric/Behavioral:  Negative for depression, hallucinations, memory loss, substance abuse and suicidal ideas. The patient is not nervous/anxious and does not have insomnia.     Objective:     Today's Vitals   09/26/20 1058  BP: (!) 158/68  Pulse: (!) 42  Temp: 97.7 F (36.5 C)  SpO2: 98%  Weight: 143 lb 9.6 oz (65.1 kg)   Body mass index is 28.04 kg/m. General Appearance: Well nourished, in no apparent distress. Eyes: PERRLA, EOMs, conjunctiva no swelling or erythema Sinuses: No Frontal/maxillary tenderness ENT/Mouth: Ext aud canals clear, TMs without erythema, bulging. No erythema, swelling, or exudate on post pharynx.  Tonsils not swollen or erythematous. Hearing normal.  Neck: Supple, thyroid normal.  Respiratory: Respiratory effort normal, BS equal bilaterally without rales, rhonchi, wheezing or stridor.  Cardio: RRR with no MRGs. Brisk peripheral pulses without edema.  Abdomen: Soft, + BS.  Non tender, no  guarding, rebound, hernias, masses. Lymphatics: Non tender without lymphadenopathy.  Musculoskeletal: Full ROM, 5/5 strength, Normal gait Skin: Warm, dry without rashes, lesions, ecchymosis.  Neuro: Cranial nerves intact. No cerebellar symptoms.  Psych: Awake and oriented X 3, normal affect, Insight and Judgment appropriate.  Medicare Attestation I have personally reviewed: The patient's medical and social history Their use of alcohol, tobacco or illicit drugs Their current medications and supplements The patient's functional ability including ADLs,fall risks, home safety risks, cognitive, and hearing and visual impairment Diet and physical activities Evidence for depression or mood disorders  The patient's weight, height, BMI, and visual acuity have been recorded in the chart.  I have made referrals, counseling, and provided education to the patient based on review of the above and I have provided the patient with a written personalized care plan for preventive services.     [name], NP   09/25/2019

## 2020-09-26 ENCOUNTER — Ambulatory Visit: Payer: Medicare PPO | Admitting: Adult Health Nurse Practitioner

## 2020-09-26 ENCOUNTER — Ambulatory Visit: Payer: Medicare PPO | Admitting: Nurse Practitioner

## 2020-09-26 ENCOUNTER — Other Ambulatory Visit: Payer: Self-pay

## 2020-09-26 ENCOUNTER — Encounter: Payer: Self-pay | Admitting: Nurse Practitioner

## 2020-09-26 VITALS — BP 158/68 | HR 42 | Temp 97.7°F | Wt 143.6 lb

## 2020-09-26 DIAGNOSIS — R0989 Other specified symptoms and signs involving the circulatory and respiratory systems: Secondary | ICD-10-CM | POA: Diagnosis not present

## 2020-09-26 DIAGNOSIS — E559 Vitamin D deficiency, unspecified: Secondary | ICD-10-CM

## 2020-09-26 DIAGNOSIS — Z0000 Encounter for general adult medical examination without abnormal findings: Secondary | ICD-10-CM

## 2020-09-26 DIAGNOSIS — Z6828 Body mass index (BMI) 28.0-28.9, adult: Secondary | ICD-10-CM

## 2020-09-26 DIAGNOSIS — R7309 Other abnormal glucose: Secondary | ICD-10-CM

## 2020-09-26 DIAGNOSIS — E782 Mixed hyperlipidemia: Secondary | ICD-10-CM

## 2020-09-26 DIAGNOSIS — I700 Atherosclerosis of aorta: Secondary | ICD-10-CM

## 2020-09-26 DIAGNOSIS — Z79899 Other long term (current) drug therapy: Secondary | ICD-10-CM | POA: Diagnosis not present

## 2020-09-26 DIAGNOSIS — E039 Hypothyroidism, unspecified: Secondary | ICD-10-CM

## 2020-09-26 DIAGNOSIS — Z853 Personal history of malignant neoplasm of breast: Secondary | ICD-10-CM | POA: Diagnosis not present

## 2020-09-26 DIAGNOSIS — Z1389 Encounter for screening for other disorder: Secondary | ICD-10-CM

## 2020-09-26 DIAGNOSIS — Z0001 Encounter for general adult medical examination with abnormal findings: Secondary | ICD-10-CM | POA: Diagnosis not present

## 2020-09-26 DIAGNOSIS — K5790 Diverticulosis of intestine, part unspecified, without perforation or abscess without bleeding: Secondary | ICD-10-CM

## 2020-09-26 DIAGNOSIS — R6889 Other general symptoms and signs: Secondary | ICD-10-CM

## 2020-09-26 DIAGNOSIS — E669 Obesity, unspecified: Secondary | ICD-10-CM

## 2020-09-26 NOTE — Patient Instructions (Signed)

## 2020-09-27 LAB — CBC WITH DIFFERENTIAL/PLATELET
Absolute Monocytes: 712 cells/uL (ref 200–950)
Basophils Absolute: 32 cells/uL (ref 0–200)
Basophils Relative: 0.5 %
Eosinophils Absolute: 151 cells/uL (ref 15–500)
Eosinophils Relative: 2.4 %
HCT: 38.9 % (ref 35.0–45.0)
Hemoglobin: 12.8 g/dL (ref 11.7–15.5)
Lymphs Abs: 2022 cells/uL (ref 850–3900)
MCH: 28.1 pg (ref 27.0–33.0)
MCHC: 32.9 g/dL (ref 32.0–36.0)
MCV: 85.5 fL (ref 80.0–100.0)
MPV: 11.7 fL (ref 7.5–12.5)
Monocytes Relative: 11.3 %
Neutro Abs: 3383 cells/uL (ref 1500–7800)
Neutrophils Relative %: 53.7 %
Platelets: 175 10*3/uL (ref 140–400)
RBC: 4.55 10*6/uL (ref 3.80–5.10)
RDW: 13.4 % (ref 11.0–15.0)
Total Lymphocyte: 32.1 %
WBC: 6.3 10*3/uL (ref 3.8–10.8)

## 2020-09-27 LAB — LIPID PANEL
Cholesterol: 217 mg/dL — ABNORMAL HIGH (ref <200)
HDL: 68 mg/dL (ref >=50)
LDL Cholesterol (Calc): 132 mg/dL (calc) — ABNORMAL HIGH
Non-HDL Cholesterol (Calc): 149 mg/dL (calc) — ABNORMAL HIGH (ref <130)
Total CHOL/HDL Ratio: 3.2 (calc) (ref <5.0)
Triglycerides: 78 mg/dL (ref <150)

## 2020-09-27 LAB — VITAMIN D 25 HYDROXY (VIT D DEFICIENCY, FRACTURES): Vit D, 25-Hydroxy: 84 ng/mL (ref 30–100)

## 2020-09-27 LAB — COMPLETE METABOLIC PANEL WITH GFR
AG Ratio: 1.1 (calc) (ref 1.0–2.5)
ALT: 7 U/L (ref 6–29)
AST: 29 U/L (ref 10–35)
Albumin: 3.8 g/dL (ref 3.6–5.1)
Alkaline phosphatase (APISO): 64 U/L (ref 37–153)
BUN: 17 mg/dL (ref 7–25)
CO2: 29 mmol/L (ref 20–32)
Calcium: 9.5 mg/dL (ref 8.6–10.4)
Chloride: 101 mmol/L (ref 98–110)
Creat: 0.63 mg/dL (ref 0.60–0.95)
Globulin: 3.4 g/dL (calc) (ref 1.9–3.7)
Glucose, Bld: 99 mg/dL (ref 65–99)
Potassium: 4.3 mmol/L (ref 3.5–5.3)
Sodium: 136 mmol/L (ref 135–146)
Total Bilirubin: 0.4 mg/dL (ref 0.2–1.2)
Total Protein: 7.2 g/dL (ref 6.1–8.1)
eGFR: 87 mL/min/{1.73_m2} (ref >=60)

## 2020-09-27 LAB — MICROALBUMIN / CREATININE URINE RATIO
Creatinine, Urine: 14 mg/dL — ABNORMAL LOW (ref 20–275)
Microalb, Ur: <0.2 mg/dL

## 2020-09-27 LAB — HEMOGLOBIN A1C
Hgb A1c MFr Bld: 6 % of total Hgb — ABNORMAL HIGH (ref <5.7)
Mean Plasma Glucose: 126 mg/dL
eAG (mmol/L): 7 mmol/L

## 2020-09-27 LAB — MAGNESIUM: Magnesium: 1.8 mg/dL (ref 1.5–2.5)

## 2020-09-27 LAB — TSH: TSH: 0.55 mIU/L (ref 0.40–4.50)

## 2020-10-11 ENCOUNTER — Other Ambulatory Visit: Payer: Self-pay | Admitting: Internal Medicine

## 2020-10-11 DIAGNOSIS — E039 Hypothyroidism, unspecified: Secondary | ICD-10-CM

## 2020-10-11 MED ORDER — LEVOTHYROXINE SODIUM 50 MCG PO TABS: ORAL_TABLET | ORAL | 3 refills | Status: DC

## 2020-10-18 ENCOUNTER — Encounter: Payer: Self-pay | Admitting: Internal Medicine

## 2020-10-31 DIAGNOSIS — E039 Hypothyroidism, unspecified: Secondary | ICD-10-CM | POA: Diagnosis not present

## 2020-10-31 DIAGNOSIS — Z803 Family history of malignant neoplasm of breast: Secondary | ICD-10-CM | POA: Diagnosis not present

## 2020-10-31 DIAGNOSIS — R030 Elevated blood-pressure reading, without diagnosis of hypertension: Secondary | ICD-10-CM | POA: Diagnosis not present

## 2020-10-31 DIAGNOSIS — Z853 Personal history of malignant neoplasm of breast: Secondary | ICD-10-CM | POA: Diagnosis not present

## 2020-11-21 ENCOUNTER — Other Ambulatory Visit: Payer: Self-pay

## 2020-11-21 ENCOUNTER — Ambulatory Visit (INDEPENDENT_AMBULATORY_CARE_PROVIDER_SITE_OTHER): Payer: Medicare PPO

## 2020-11-21 DIAGNOSIS — Z23 Encounter for immunization: Secondary | ICD-10-CM

## 2020-12-08 ENCOUNTER — Ambulatory Visit: Payer: Medicare PPO | Attending: Internal Medicine

## 2020-12-08 DIAGNOSIS — Z23 Encounter for immunization: Secondary | ICD-10-CM

## 2020-12-08 NOTE — Progress Notes (Signed)
   Covid-19 Vaccination Clinic  Name:  SHAHANA CAPES    MRN: [id number] DOB: [birth year]  12/08/2020  Ms. Harnack was observed post Covid-19 immunization for 15 minutes without incident. She was provided with Vaccine Information Sheet and instruction to access the V-Safe system.   Ms. Sou was instructed to call 911 with any severe reactions post vaccine: Difficulty breathing  Swelling of face and throat  A fast heartbeat  A bad rash all over body  Dizziness and weakness   Immunizations Administered     Name Date Dose VIS Date Route   Pfizer Covid-19 Vaccine Bivalent Booster 12/08/2020  2:19 PM 0.3 mL 09/07/2020 Intramuscular   Manufacturer: [place]   Lot: [id number]   [place]: [phone]

## 2020-12-10 ENCOUNTER — Other Ambulatory Visit (HOSPITAL_BASED_OUTPATIENT_CLINIC_OR_DEPARTMENT_OTHER): Payer: Self-pay

## 2020-12-10 MED ORDER — PFIZER COVID-19 VAC BIVALENT 30 MCG/0.3ML IM SUSP
INTRAMUSCULAR | 0 refills | Status: DC
  Filled 2020-12-10: qty 0.3, 1d supply, fill #0

## 2020-12-12 ENCOUNTER — Other Ambulatory Visit (HOSPITAL_BASED_OUTPATIENT_CLINIC_OR_DEPARTMENT_OTHER): Payer: Self-pay

## 2021-01-26 DIAGNOSIS — H04123 Dry eye syndrome of bilateral lacrimal glands: Secondary | ICD-10-CM | POA: Diagnosis not present

## 2021-01-26 DIAGNOSIS — H0289 Other specified disorders of eyelid: Secondary | ICD-10-CM | POA: Diagnosis not present

## 2021-01-26 DIAGNOSIS — Z961 Presence of intraocular lens: Secondary | ICD-10-CM | POA: Diagnosis not present

## 2021-03-22 ENCOUNTER — Ambulatory Visit: Payer: Medicare PPO | Admitting: Internal Medicine

## 2021-03-22 ENCOUNTER — Other Ambulatory Visit: Payer: Self-pay

## 2021-03-22 ENCOUNTER — Encounter: Payer: Self-pay | Admitting: Internal Medicine

## 2021-03-22 VITALS — BP 138/78 | HR 76 | Temp 97.9°F | Resp 16 | Ht 60.0 in | Wt 140.0 lb

## 2021-03-22 DIAGNOSIS — Z79899 Other long term (current) drug therapy: Secondary | ICD-10-CM | POA: Diagnosis not present

## 2021-03-22 DIAGNOSIS — R7309 Other abnormal glucose: Secondary | ICD-10-CM

## 2021-03-22 DIAGNOSIS — E559 Vitamin D deficiency, unspecified: Secondary | ICD-10-CM | POA: Diagnosis not present

## 2021-03-22 DIAGNOSIS — Z0000 Encounter for general adult medical examination without abnormal findings: Secondary | ICD-10-CM | POA: Diagnosis not present

## 2021-03-22 DIAGNOSIS — I700 Atherosclerosis of aorta: Secondary | ICD-10-CM | POA: Diagnosis not present

## 2021-03-22 DIAGNOSIS — R0989 Other specified symptoms and signs involving the circulatory and respiratory systems: Secondary | ICD-10-CM | POA: Diagnosis not present

## 2021-03-22 DIAGNOSIS — Z136 Encounter for screening for cardiovascular disorders: Secondary | ICD-10-CM

## 2021-03-22 DIAGNOSIS — E039 Hypothyroidism, unspecified: Secondary | ICD-10-CM | POA: Diagnosis not present

## 2021-03-22 DIAGNOSIS — I10 Essential (primary) hypertension: Secondary | ICD-10-CM | POA: Diagnosis not present

## 2021-03-22 DIAGNOSIS — Z1211 Encounter for screening for malignant neoplasm of colon: Secondary | ICD-10-CM

## 2021-03-22 DIAGNOSIS — E782 Mixed hyperlipidemia: Secondary | ICD-10-CM

## 2021-03-22 DIAGNOSIS — Z0001 Encounter for general adult medical examination with abnormal findings: Secondary | ICD-10-CM

## 2021-03-22 NOTE — Progress Notes (Signed)
? ?Annual Screening/Preventative Visit ?& Comprehensive Evaluation &  Examination ? ?Future Appointments  ?Date Time Provider Department  ?03/22/2021  2:00 PM [name], MD [facility]  ?09/27/2021             Wellness  10:30 AM Robin Bernheim, NP [facility]  ?03/28/2022  2:00 PM [name], MD [facility]  ? ? ?    This very nice 87 y.o. MBF presents for a Screening /Preventative Visit & comprehensive evaluation and management of multiple medical co-morbidities.  Patient has been followed for HTN, HLD, Prediabetes  and Vitamin D Deficiency.  Chest X-ray in 2015 showed Aortic Atherosclerosis. ? ? ?     HTN predates since  2000.   Patient's BP has been controlled at home and patient denies any cardiac symptoms as chest pain, palpitations, shortness of breath, dizziness or ankle swelling. Today's BP is at goal - 138/78 . ? ? ?    Patient's hyperlipidemia is controlled with diet.  Last lipids were not at goal : ? ?Lab Results  ?Component Value Date  ? CHOL 217 (H) 09/26/2020  ? HDL 68 09/26/2020  ? LDLCALC 132 (H) 09/26/2020  ? TRIG 78 09/26/2020  ? CHOLHDL 3.2 09/26/2020  ? ? ? ?    Patient has hx/o prediabetes  (A1c 6.1% /2011 & 6.4% /2012) and patient denies reactive hypoglycemic symptoms, visual blurring, diabetic polys or paresthesias. Last A1c was not at goal : ? ?Lab Results  ?Component Value Date  ? HGBA1C 6.0 (H) 09/26/2020  ? ? ?                                      In 2011, patient was dx'd with Hypothyroid Goiter & was initiated on replacement therapy. ? ? ?    Finally, patient has history of Vitamin D Deficiency and last Vitamin D was at goal : ? ?Lab Results  ?Component Value Date  ? VD25OH 84 09/26/2020  ? ? ? ?Current Outpatient Medications on File Prior to Visit  ?Medication Sig  ? aspirin 81 MG tablet Take daily.  ? CALCIUM PO Take 1 tablet  daily.  ? VITAMIN D 2000 UNITS  Take 2,000 Units  3  times daily.  ? fish oil-omega-3  1000 MG capsule Take 2 g  daily.  ? levothyroxine  50 MCG tablet  Take  1 tablet  Daily    ? magnesium 30 MG tablet Take 30 mg  2 times daily.  ? Multiple Vitamin  Take 1 tablet  daily.  ? Red Yeast Rice 600 MG CAPS Take daily.   ? vitamin C 500 MG tablet Take daily.  ? ? ? ? ?Past Medical History:  ?Diagnosis Date  ? Breast cancer ([place]) 08/2011  ? right  ? Diverticulosis   ? Hypothyroidism   ? Labile hypertension   ? Prediabetes   ? Thoracic aorta atherosclerosis ([place])   ? via CXR  ? Vitamin D deficiency   ? ? ? ?Health Maintenance  ?Topic Date Due  ? Zoster Vaccines- Shingrix (1 of 2) Never done  ? TETANUS/TDAP  09/26/2021 (Originally 05/19/2018)  ? MAMMOGRAM  08/25/2021  ? Pneumonia Vaccine 36+ Years old  Completed  ? INFLUENZA VACCINE  Completed  ? DEXA SCAN  Completed  ? COVID-19 Vaccine  Completed  ? HPV VACCINES  Aged Out  ? ? ? ?Immunization History  ?Administered Date(s) Administered  ?  Influenza, High Dose  11/25/2018, 11/25/2019, 11/21/2020  ? PFIZER  Covid-19 Vacc 06/15/2020  ? PFIZER SARS-COV-2 Vacc  01/21/2019, 02/10/2019  ? Pfizer [employer]  12/08/2020  ? Pneumococcal -13 03/01/2014  ? Pneumococcal -23 01/09/2007  ? Td 05/18/2008  ? ? ?Last Colon - 08/18/2012 - Dr Sharlett Iles - hyperplastic polyp - recc 1 yr f/u  In Aug 2024 (& patient would be 87 yo) - deferred due to age ? ? ?Last MGM - 08/25/2020 ? ? ?Past Surgical History:  ?Procedure Laterality Date  ? BREAST SURGERY    ? right breast  ? COLONOSCOPY    ? MASTECTOMY COMPLETE / SIMPLE W/ SENTINEL NODE BIOPSY  08/10/11  ? right  ? MASTECTOMY W/ SENTINEL NODE BIOPSY  08/10/2011  ? Procedure: MASTECTOMY WITH SENTINEL LYMPH NODE BIOPSY;  Surgeon: Harl Bowie, MD;  Location: [facility];  Service: General;  Laterality: Right;  ? ? ? ?Family History  ?Problem Relation Age of Onset  ? Colon cancer Mother 89  ? Prostate cancer Father   ? Lung cancer Brother   ? Lung cancer Sister   ? Kidney cancer Brother   ? ? ? ?Social History  ? ?Tobacco Use  ? Smoking status: Never  ? Smokeless tobacco: Never  ?Substance  Use Topics  ? Alcohol use: No  ? Drug use: No  ? ? ? ? ROS ?Constitutional: Denies fever, chills, weight loss/gain, headaches, insomnia,  night sweats, and change in appetite. Does c/o fatigue. ?Eyes: Denies redness, blurred vision, diplopia, discharge, itchy, watery eyes.  ?ENT: Denies discharge, congestion, post nasal drip, epistaxis, sore throat, earache, hearing loss, dental pain, Tinnitus, Vertigo, Sinus pain, snoring.  ?Cardio: Denies chest pain, palpitations, irregular heartbeat, syncope, dyspnea, diaphoresis, orthopnea, PND, claudication, edema ?Respiratory: denies cough, dyspnea, DOE, pleurisy, hoarseness, laryngitis, wheezing.  ?Gastrointestinal: Denies dysphagia, heartburn, reflux, water brash, pain, cramps, nausea, vomiting, bloating, diarrhea, constipation, hematemesis, melena, hematochezia, jaundice, hemorrhoids ?Genitourinary: Denies dysuria, frequency, urgency, nocturia, hesitancy, discharge, hematuria, flank pain ?Breast: Breast lumps, nipple discharge, bleeding.  ?Musculoskeletal: Denies arthralgia, myalgia, stiffness, Jt. Swelling, pain, limp, and strain/sprain. Denies falls. ?Skin: Denies puritis, rash, hives, warts, acne, eczema, changing in skin lesion ?Neuro: No weakness, tremor, incoordination, spasms, paresthesia, pain ?Psychiatric: Denies confusion, memory loss, sensory loss. Denies Depression. ?Endocrine: Denies change in weight, skin, hair change, nocturia, and paresthesia, diabetic polys, visual blurring, hyper / hypo glycemic episodes.  ?Heme/Lymph: No excessive bleeding, bruising, enlarged lymph nodes. ? ?Physical Exam ? ?BP 138/78   Pulse 76   Temp 97.9 ?F (36.6 ?C)   Resp 16   Ht 5' (1.524 m)   Wt 140 lb (63.5 kg)   SpO2 95%   BMI 27.34 kg/m?  ? ?General Appearance: Well nourished, well groomed and in no apparent distress. ? ?Eyes: PERRLA, EOMs, conjunctiva no swelling or erythema, normal fundi and vessels. ?Sinuses: No frontal/maxillary tenderness ?ENT/Mouth: EACs patent /  TMs  nl. Nares clear without erythema, swelling, mucoid exudates. Oral hygiene is good. No erythema, swelling, or exudate. Tongue normal, non-obstructing. Tonsils not swollen or erythematous. Hearing normal.  ?Neck: Supple, thyroid not palpable. No bruits, nodes or JVD. ?Respiratory: Respiratory effort normal.  BS equal and clear bilateral without rales, rhonci, wheezing or stridor. ?Cardio: Heart sounds are normal with regular rate and rhythm and no murmurs, rubs or gallops. Peripheral pulses are normal and equal bilaterally without edema. No aortic or femoral bruits. ?Chest: symmetric with normal excursions and percussion. ?Breasts: Symmetric, without lumps, nipple discharge, retractions, or  fibrocystic changes.  ?Abdomen: Flat, soft with bowel sounds active. Nontender, no guarding, rebound, hernias, masses, or organomegaly.  ?Lymphatics: Non tender without lymphadenopathy.  ?Genitourinary:  ?Musculoskeletal: Full ROM all peripheral extremities, joint stability, 5/5 strength, and normal gait. ?Skin: Warm and dry without rashes, lesions, cyanosis, clubbing or  ecchymosis.  ?Neuro: Cranial nerves intact, reflexes equal bilaterally. Normal muscle tone, no cerebellar symptoms. Sensation intact.  ?Pysch: Alert and oriented X 3, normal affect, Insight and Judgment appropriate.  ? ? ?Assessment and Plan ? ?1. Annual Preventative Screening Examination ? ? ?2. Labile hypertension ? ?- EKG 12-Lead ?- Urinalysis, Routine w reflex microscopic ?- Microalbumin / creatinine urine ratio ?- CBC with Differential/Platelet ?- COMPLETE METABOLIC PANEL WITH GFR ?- Magnesium ?- TSH ? ?3. Hyperlipidemia, mixed ? ?- EKG 12-Lead ?- Lipid panel ?- TSH ? ?4. Abnormal glucose ? ?- EKG 12-Lead ?- Hemoglobin A1c ?- Insulin, random ? ?5. Vitamin D deficiency ? ?- VITAMIN D 25 Hydroxy  ? ?6. Atherosclerosis of aorta ([place]) ? ?- EKG 12-Lead ?- Lipid panel ? ?7. Hypothyroidism ? ?- TSH ? ?8. Screening for colorectal cancer ? ?- POC Hemoccult  Bld/Stl  ? ?9. Aortic atherosclerosis by CXR 04/17/2013 ? ?- EKG 12-Lead ? ?10. Screening for ischemic heart disease ? ?- EKG 12-Lead ? ?11. Medication management ?  ?- Urinalysis, Routine w reflex microscopic ?- Microal

## 2021-03-22 NOTE — Patient Instructions (Signed)

## 2021-03-23 ENCOUNTER — Other Ambulatory Visit: Payer: Self-pay | Admitting: Internal Medicine

## 2021-03-23 LAB — COMPLETE METABOLIC PANEL WITH GFR
AG Ratio: 1.1 (calc) (ref 1.0–2.5)
ALT: 7 U/L (ref 6–29)
AST: 25 U/L (ref 10–35)
Albumin: 3.9 g/dL (ref 3.6–5.1)
Alkaline phosphatase (APISO): 73 U/L (ref 37–153)
BUN: 17 mg/dL (ref 7–25)
CO2: 28 mmol/L (ref 20–32)
Calcium: 9.6 mg/dL (ref 8.6–10.4)
Chloride: 102 mmol/L (ref 98–110)
Creat: 0.77 mg/dL (ref 0.60–0.95)
Globulin: 3.6 g/dL (calc) (ref 1.9–3.7)
Glucose, Bld: 89 mg/dL (ref 65–99)
Potassium: 4.6 mmol/L (ref 3.5–5.3)
Sodium: 139 mmol/L (ref 135–146)
Total Bilirubin: 0.4 mg/dL (ref 0.2–1.2)
Total Protein: 7.5 g/dL (ref 6.1–8.1)
eGFR: 76 mL/min/{1.73_m2} (ref >=60)

## 2021-03-23 LAB — MICROALBUMIN / CREATININE URINE RATIO
Creatinine, Urine: 28 mg/dL (ref 20–275)
Microalb Creat Ratio: 14 mcg/mg creat (ref <30)
Microalb, Ur: 0.4 mg/dL

## 2021-03-23 LAB — URINALYSIS, ROUTINE W REFLEX MICROSCOPIC
Bacteria, UA: NONE SEEN /HPF
Bilirubin Urine: NEGATIVE
Glucose, UA: NEGATIVE
Hgb urine dipstick: NEGATIVE
Hyaline Cast: NONE SEEN /LPF
Ketones, ur: NEGATIVE
Nitrite: NEGATIVE
Protein, ur: NEGATIVE
RBC / HPF: NONE SEEN /HPF (ref 0–2)
Specific Gravity, Urine: 1.007 (ref 1.001–1.035)
pH: 6 (ref 5.0–8.0)

## 2021-03-23 LAB — CBC WITH DIFFERENTIAL/PLATELET
Absolute Monocytes: 429 cells/uL (ref 200–950)
Basophils Absolute: 33 cells/uL (ref 0–200)
Basophils Relative: 0.5 %
Eosinophils Absolute: 130 cells/uL (ref 15–500)
Eosinophils Relative: 2 %
HCT: 38.8 % (ref 35.0–45.0)
Hemoglobin: 12.9 g/dL (ref 11.7–15.5)
Lymphs Abs: 2633 cells/uL (ref 850–3900)
MCH: 28.4 pg (ref 27.0–33.0)
MCHC: 33.2 g/dL (ref 32.0–36.0)
MCV: 85.3 fL (ref 80.0–100.0)
MPV: 11.9 fL (ref 7.5–12.5)
Monocytes Relative: 6.6 %
Neutro Abs: 3276 cells/uL (ref 1500–7800)
Neutrophils Relative %: 50.4 %
Platelets: 158 10*3/uL (ref 140–400)
RBC: 4.55 10*6/uL (ref 3.80–5.10)
RDW: 13 % (ref 11.0–15.0)
Total Lymphocyte: 40.5 %
WBC: 6.5 10*3/uL (ref 3.8–10.8)

## 2021-03-23 LAB — HEMOGLOBIN A1C
Hgb A1c MFr Bld: 6.1 % of total Hgb — ABNORMAL HIGH (ref <5.7)
Mean Plasma Glucose: 128 mg/dL
eAG (mmol/L): 7.1 mmol/L

## 2021-03-23 LAB — LIPID PANEL
Cholesterol: 185 mg/dL (ref <200)
HDL: 80 mg/dL (ref >=50)
LDL Cholesterol (Calc): 92 mg/dL (calc)
Non-HDL Cholesterol (Calc): 105 mg/dL (calc) (ref <130)
Total CHOL/HDL Ratio: 2.3 (calc) (ref <5.0)
Triglycerides: 50 mg/dL (ref <150)

## 2021-03-23 LAB — VITAMIN D 25 HYDROXY (VIT D DEFICIENCY, FRACTURES): Vit D, 25-Hydroxy: 93 ng/mL (ref 30–100)

## 2021-03-23 LAB — TSH: TSH: 0.43 mIU/L (ref 0.40–4.50)

## 2021-03-23 LAB — INSULIN, RANDOM: Insulin: 3.2 u[IU]/mL

## 2021-03-23 LAB — MAGNESIUM: Magnesium: 2 mg/dL (ref 1.5–2.5)

## 2021-03-23 NOTE — Progress Notes (Signed)
<><><><><><><><><><><><><><><><><><><><><><><><><><><><><><><><><> ?<><><><><><><><><><><><><><><><><><><><><><><><><><><><><><><><><> ?-   Test results slightly outside the reference range are not unusual. ?If there is anything important, I will review this with you,  ?otherwise it is considered normal test values.  ?If you have further questions,  ?please do not hesitate to contact me at the office or via My Chart.  ?<><><><><><><><><><><><><><><><><><><><><><><><><><><><><><><><><> ?<><><><><><><><><><><><><><><><><><><><><><><><><><><><><><><><><> ? ?-  U/A- loaded with WBC -  Have asked Lab to do a culture to see if an infection ( UTI ) ?<><><><><><><><><><><><><><><><><><><><><><><><><><><><><><><><><> ?<><><><><><><><><><><><><><><><><><><><><><><><><><><><><><><><><> ? ?-  A1c = 6.1% - Blood sugar and A1c are STILL elevated in the borderline and  ?early or pre-diabetes range which has the same  ? ?300% increased risk for heart attack, stroke, cancer and  ? ?alzheimer- type vascular dementia as full blown diabetes.  ? ?But the good news is that diet, exercise with ?weight loss can cure the early diabetes at this point. ? ?<><><><><><><><><><><><><><><><><><><><><><><><><><><><><><><><><> ? ?-  It is very important that you work harder with diet by  ?avoiding all foods that are white except chicken,   ?fish & calliflower. ? ?- Avoid white rice  ?(brown & wild rice is OK),  ? ?- Avoid white potatoes  ?(sweet potatoes in moderation is OK),  ? ?White bread or wheat bread or anything made out of  ? ?white flour like bagels, donuts, rolls, buns, biscuits, cakes, ? ?- pastries, cookies, pizza crust, and pasta (made from  ?white flour & egg whites)  ? ?- vegetarian pasta or spinach or wheat pasta is OK. ? ?- Multigrain breads like Arnold's, Pepperidge Farm or  ? ?multigrain sandwich thins or high fiber breads like  ? ?[place] bread or "Dave's Killer" breads that are  ?4 to 5 grams fiber per slice !  are best.   ? ?Diet,  exercise and weight loss can reverse and cure  ?diabetes in the early stages.   ?<><><><><><><><><><><><><><><><><><><><><><><><><><><><><><><><><> ?<><><><><><><><><><><><><><><><><><><><><><><><><><><><><><><><><> ? ?-  Total Chol = 185   &  LDL Chol = 92  - Both    Excellent - Greatly IMPROVED   !  ? ?- Very low risk for Heart Attack  / Stroke ?<><><><><><><><><><><><><><><><><><><><><><><><><><><><><><><><><> ?<><><><><><><><><><><><><><><><><><><><><><><><><><><><><><><><><> ? ?-  Vitamin D = 93  - Excellent   - Please keep dose same  ?<><><><><><><><><><><><><><><><><><><><><><><><><><><><><><><><><> ?<><><><><><><><><><><><><><><><><><><><><><><><><><><><><><><><><> ? ?-  All Else - CBC - Kidneys - Electrolytes - Liver - Magnesium & Thyroid   ? ?- all  Normal / OK ?<><><><><><><><><><><><><><><><><><><><><><><><><><><><><><><><><> ?<><><><><><><><><><><><><><><><><><><><><><><><><><><><><><><><><> ? ?-  Keep up the [place] Work  ! ? ?<><><><><><><><><><><><><><><><><><><><><><><><><><><><><><><><><> ?<><><><><><><><><><><><><><><><><><><><><><><><><><><><><><><><><> ? ? ? ? ? ? ? ? ? ? ? ? ? ? ? ? ? ? ? ? ? ? ? ?

## 2021-03-24 DIAGNOSIS — N3000 Acute cystitis without hematuria: Secondary | ICD-10-CM | POA: Diagnosis not present

## 2021-03-26 LAB — URINE CULTURE
MICRO NUMBER:: 13146507
SPECIMEN QUALITY:: ADEQUATE

## 2021-03-26 NOTE — Progress Notes (Signed)
<>  0<>0<>0<>0<>0<>0<>0<>0<>0<>0<>0<>0<>0<> ? ?- Sun 01:30 AM ? ?- Urine Culture returned Negative - No Infection ? ?<>0<>0<>0<>0<>0<>0<>0<>0<>0<>0<>0<>0<>0<> ? ? ? ? ?

## 2021-08-31 DIAGNOSIS — Z1231 Encounter for screening mammogram for malignant neoplasm of breast: Secondary | ICD-10-CM | POA: Diagnosis not present

## 2021-08-31 LAB — HM MAMMOGRAPHY

## 2021-09-14 ENCOUNTER — Encounter: Payer: Self-pay | Admitting: Internal Medicine

## 2021-09-26 NOTE — Progress Notes (Unsigned)
MEDICARE ANNUAL WELLNESS VISIT AND FOLLOW UP  Assessment:   Diagnoses and all orders for this visit:  Encounter for Medicare annual wellness exam [place]  Labile hypertension (2000) Currently on no medication.  Will be monitoring BP at home and keep a log for next 2 weeks .  If BP is running less than 130/80 she can cancel appointment, if not follow up in 2 weeks and will discuss medication Monitor blood pressure at home; call if consistently over 130/80 Continue DASH diet.   Reminder to go to the ER if any CP, SOB, nausea, dizziness, severe HA, changes vision/speech, left arm numbness and tingling and jaw pain. CBC  Hyperlipidemia At goal; continue with red yeast rice supplement Continue low cholesterol diet and exercise.  Check lipid panel biannually CMP  Atherosclerosis of aorta ([facility]) Control blood pressure, lipids and glucose Disscused lifestyle modifications, diet & exercise Continue to monitor  Hypothyroidism, unspecified type Taking levothyroxine 50 mcg daily Reminder to take on an empty stomach 30-71mns before first meal of the day. No antacid medications for 4 hours. TSH  Diverticulosis Increase fiber Bbowel management discussed Continue to monitor  Vitamin D deficiency At goal at recent check; continue to recommend supplementation for goal of 60-100 Defer vitamin D level  History of breast cancer,  S/P mastectomy, annual MMGs, has been released by oncology 07/2019 [facility]  Abnormal Glucose (06/2009) Discussed disease and risks Discussed diet/exercise, weight management  A1C yearly; has been stable Microalbumin/creatinine urine ratio  Medication management Continued  Overweight, BMI 28 Long discussion about weight loss, diet, and exercise Recommended diet heavy in fruits and veggies and low in animal meats, cheeses, and dairy products, appropriate calorie intake Patient will work on portions, cutting  down on carbs Has lost 6 pounds since last year, wants to lose 5 more. Follow up at next visit   Over 40 minutes of face to face interview, exam, counseling, chart review and critical decision making was performed.  Future Appointments  Date Time Provider [place] 09/27/2021 10:30 AM WAlycia Rossetti NP [facility] None  03/28/2022  2:00 PM MUnk Pinto MD [facility] None     Plan:   During the course of the visit the patient was educated and counseled about appropriate screening and preventive services including:   Pneumococcal vaccine  Prevnar 13 Influenza vaccine Td vaccine Screening electrocardiogram Bone densitometry screening Colorectal cancer screening Diabetes screening Glaucoma screening Nutrition counseling  Advanced directives: requested   Subjective:  Robin RACEYis a 87y.o. female who presents for Medicare Annual Wellness Visit and 6 month follow up.    She has hx/o R Mastectomy,  Rt Breast DCIS (ER+), 2013,  and is followed by Dr GLindi Adie is finished up a 5 year course in 2019 of Tamoxifen with no recurrence by annual mammograms. Has been released.  Continues yearly mammograms.  BMI is There is no height or weight on file to calculate BMI., she has been working on diet and exercise. She has been focusing on fruits and vegetables and has been cutting down on carbohydrates. She walks 30-60 min most days.  She is down 7lbs since last OV.  Wt Readings from Last 3 Encounters:  03/22/21 140 lb (63.5 kg)  09/26/20 143 lb 9.6 oz (65.1 kg)  03/15/20 148 lb 3.2 oz (67.2 kg)   HTN predates 2000  Her blood pressure has been controlled at home, today their BP is   She does workout.  She denies chest pain, shortness of breath, dizziness.   She is not on cholesterol medication (on red yeast rice and Omega3 2,'000mg'$  daily. Her cholesterol is not at goal. The cholesterol last visit was:   Lab Results  Component Value Date   CHOL 185 03/22/2021   HDL 80  03/22/2021   LDLCALC 92 03/22/2021   TRIG 50 03/22/2021   CHOLHDL 2.3 03/22/2021    She has been working on diet and exercise for prediabetes/abnormal glucose, (A1c 6.1%, 2011 & 6.4%, 2012) and denies foot ulcerations, hyperglycemia, increased appetite, nausea, paresthesia of the feet, polydipsia, polyuria, visual disturbances, vomiting and weight loss. Last A1C in the office was:  Lab Results  Component Value Date   HGBA1C 6.1 (H) 03/22/2021   She is on thyroid medication and taking levothyroxine 25mg daily..Marland KitchenHer medication was not changed last visit.   Lab Results  Component Value Date   TSH 0.43 03/22/2021  . Last GFR: Lab Results  Component Value Date   GFRAA 79 03/15/2020   Patient is on Vitamin D supplement, 6,000IU daily, and at goal last check:    Lab Results  Component Value Date   VD25OH 93 03/22/2021      Medication Review: Current Outpatient Medications on File Prior to Visit  Medication Sig Dispense Refill   aspirin 81 MG tablet Take 81 mg by mouth daily.     CALCIUM PO Take 1 tablet by mouth daily.     Cholecalciferol (VITAMIN D) 2000 UNITS tablet Take 2,000 Units by mouth 3 (three) times daily.     fish oil-omega-3 fatty acids 1000 MG capsule Take 2 g by mouth daily.     levothyroxine (EUTHYROX) 50 MCG tablet Take  1 tablet  Daily  on an empty stomach with only water for 30 minutes & no Antacid meds, Calcium or Magnesium for 4 hours & avoid Biotin 90 tablet 3   magnesium 30 MG tablet Take 30 mg by mouth 2 (two) times daily.     Multiple Vitamin (MULTIVITAMIN) tablet Take 1 tablet by mouth daily.     Red Yeast Rice 600 MG CAPS Take 600 mg by mouth daily.      vitamin C (ASCORBIC ACID) 500 MG tablet Take 500 mg by mouth daily.     No current facility-administered medications on file prior to visit.    No Known Allergies  Current Problems (verified) Patient Active Problem List   Diagnosis Date Noted   Aortic atherosclerosis by CXR 04/17/2013 03/14/2020    Obesity (BMI 30.0-34.9) 05/15/2018   Encounter for Medicare annual wellness exam 10/07/2014   Hyperlipidemia 07/15/2013   Medication management 06/30/2013   Labile hypertension (2000)    Hypothyroidism    Other abnormal glucose (prediabetes)    Vitamin D deficiency    Diverticulosis    [place] outer [place] [place]    Screening Tests Immunization History  Administered Date(s) Administered   Influenza, High Dose Seasonal PF 11/25/2018, 11/25/2019, 11/21/2020   PFIZER Comirnaty(Gray Top)Covid-19 Tri-Sucrose Vaccine 06/15/2020   PFIZER(Purple Top)SARS-COV-2 Vaccination 01/21/2019, 02/10/2019   Pfizer Covid-19 Vaccine Bivalent Booster 148yr& up 12/08/2020   Pneumococcal Conjugate-13 03/01/2014   Pneumococcal Polysaccharide-23 01/09/2007   Td 05/18/2008    Preventative care: Last colonoscopy: 2014 wants one last follow up polyps Cologuard 05/28/18 negative due 2023 Last mammogram: 08/2020 [facility] DEXA: 07/07/14 - normal - wants to defer PAP 2012 [place]roid with bx 02/2015 negative  Prior vaccinations: TD or  Tdap: 2010 defer  Influenza: Declines Pneumococcal: 2009 Prevnar13: 2010 Shingles/Zostavax: 2010  Names of Other Physician/Practitioners you currently use: 1. [facility]- here for primary care 2. [facility], eye doctor, last visit 2022 3. Dr.  Toy Cookey, dentist, last visit 2022, goes q12m Patient Care Team: MUnk Pinto MD as PCP - General (Internal Medicine) GNicholas Lose MD as Consulting Physician (Hematology and Oncology) PSable Feil MD as Consulting Physician (Gastroenterology) BCoralie Keens MD as Consulting Physician (General Surgery)  SURGICAL HISTORY She  has a past surgical history that includes Breast surgery; Colonoscopy; Mastectomy complete / simple w/ sentinel node biopsy (08/10/11); and Mastectomy w/ sentinel node biopsy (08/10/2011). FAMILY HISTORY Her  family history includes Colon cancer (age of onset: 746 in her mother; Kidney cancer in her brother; Lung cancer in her brother and sister; Prostate cancer in her father. SOCIAL HISTORY She  reports that she has never smoked. She has never used smokeless tobacco. She reports that she does not drink alcohol and does not use drugs.   MEDICARE WELLNESS OBJECTIVES: Physical activity:   Cardiac risk factors:   Depression/mood screen:      03/22/2021   12:35 AM  Depression screen PHQ 2/9  Decreased Interest 0  Down, Depressed, Hopeless 0  PHQ - 2 Score 0    ADLs:     03/22/2021   12:36 AM  In your present state of health, do you have any difficulty performing the following activities:  Hearing? 0  Vision? 0  Difficulty concentrating or making decisions? 0  Walking or climbing stairs? 0  Dressing or bathing? 0  Doing errands, shopping? 0     Cognitive Testing  Alert? Yes  Normal Appearance? n/a  Oriented to person? Yes  Place? Yes   Time? Yes  Recall of three objects?  Yes  Can perform simple calculations? Yes  Displays appropriate judgment?Yes  Can read the correct time from a watch face? n/a  EOL planning:    Review of Systems  Constitutional:  Negative for chills, fever and weight loss.  HENT:  Negative for congestion and hearing loss.   Eyes:  Negative for blurred vision and double vision.  Respiratory:  Negative for cough and shortness of breath.   Cardiovascular:  Negative for chest pain, palpitations, orthopnea and leg swelling.  Gastrointestinal:  Negative for abdominal pain, constipation, diarrhea, heartburn, nausea and vomiting.  Musculoskeletal:  Negative for falls, joint pain and myalgias.  Skin:  Negative for rash.  Neurological:  Negative for dizziness, tingling, tremors, loss of consciousness and headaches.  Psychiatric/Behavioral:  Negative for depression, memory loss and suicidal ideas.      Objective:     There were no vitals filed for this  visit.  There is no height or weight on file to calculate BMI. General Appearance: Well nourished, in no apparent distress. Eyes: PERRLA, EOMs, conjunctiva no swelling or erythema Sinuses: No Frontal/maxillary tenderness ENT/Mouth: Ext aud canals clear, TMs without erythema, bulging. No erythema, swelling, or exudate on post pharynx.  Tonsils not swollen or erythematous. Hearing normal.  Neck: Supple, thyroid normal.  Respiratory: Respiratory effort normal, BS equal bilaterally without rales, rhonchi, wheezing or stridor.  Cardio: RRR with no MRGs. Brisk peripheral pulses without edema.  Abdomen: Soft, + BS.  Non tender, no guarding, rebound, hernias, masses. Lymphatics: Non tender without lymphadenopathy.  Musculoskeletal: Full ROM, 5/5 strength, Normal gait Skin: Warm, dry without rashes, lesions, ecchymosis.  Neuro: Cranial nerves intact. No cerebellar symptoms.  Psych: Awake and oriented X 3, normal affect, Insight and Judgment appropriate.  Medicare Attestation I have personally reviewed: The patient's medical and social history Their use of alcohol, tobacco or illicit drugs Their current medications and supplements The patient's functional ability including ADLs,fall risks, home safety risks, cognitive, and hearing and visual impairment Diet and physical activities Evidence for depression or mood disorders  The patient's weight, height, BMI, and visual acuity have been recorded in the chart.  I have made referrals, counseling, and provided education to the patient based on review of the above and I have provided the patient with a written personalized care plan for preventive services.     Alycia Rossetti, NP   09/25/2019

## 2021-09-27 ENCOUNTER — Ambulatory Visit: Payer: Medicare PPO | Admitting: Nurse Practitioner

## 2021-09-27 ENCOUNTER — Encounter: Payer: Self-pay | Admitting: Nurse Practitioner

## 2021-09-27 VITALS — BP 132/80 | HR 56 | Temp 97.2°F | Ht 60.0 in | Wt 140.8 lb

## 2021-09-27 DIAGNOSIS — R0989 Other specified symptoms and signs involving the circulatory and respiratory systems: Secondary | ICD-10-CM

## 2021-09-27 DIAGNOSIS — E039 Hypothyroidism, unspecified: Secondary | ICD-10-CM

## 2021-09-27 DIAGNOSIS — Z853 Personal history of malignant neoplasm of breast: Secondary | ICD-10-CM

## 2021-09-27 DIAGNOSIS — R7309 Other abnormal glucose: Secondary | ICD-10-CM | POA: Diagnosis not present

## 2021-09-27 DIAGNOSIS — Z0000 Encounter for general adult medical examination without abnormal findings: Secondary | ICD-10-CM

## 2021-09-27 DIAGNOSIS — K5790 Diverticulosis of intestine, part unspecified, without perforation or abscess without bleeding: Secondary | ICD-10-CM

## 2021-09-27 DIAGNOSIS — E559 Vitamin D deficiency, unspecified: Secondary | ICD-10-CM | POA: Diagnosis not present

## 2021-09-27 DIAGNOSIS — E782 Mixed hyperlipidemia: Secondary | ICD-10-CM

## 2021-09-27 DIAGNOSIS — R6889 Other general symptoms and signs: Secondary | ICD-10-CM | POA: Diagnosis not present

## 2021-09-27 DIAGNOSIS — Z23 Encounter for immunization: Secondary | ICD-10-CM

## 2021-09-27 DIAGNOSIS — Z79899 Other long term (current) drug therapy: Secondary | ICD-10-CM

## 2021-09-27 DIAGNOSIS — Z6828 Body mass index (BMI) 28.0-28.9, adult: Secondary | ICD-10-CM

## 2021-09-27 DIAGNOSIS — Z0001 Encounter for general adult medical examination with abnormal findings: Secondary | ICD-10-CM

## 2021-09-27 DIAGNOSIS — I700 Atherosclerosis of aorta: Secondary | ICD-10-CM

## 2021-09-27 NOTE — Patient Instructions (Signed)

## 2021-09-28 LAB — CBC WITH DIFFERENTIAL/PLATELET
Absolute Monocytes: 650 cells/uL (ref 200–950)
Basophils Absolute: 23 cells/uL (ref 0–200)
Basophils Relative: 0.4 %
Eosinophils Absolute: 120 cells/uL (ref 15–500)
Eosinophils Relative: 2.1 %
HCT: 37.1 % (ref 35.0–45.0)
Hemoglobin: 12.4 g/dL (ref 11.7–15.5)
Lymphs Abs: 2001 cells/uL (ref 850–3900)
MCH: 28.2 pg (ref 27.0–33.0)
MCHC: 33.4 g/dL (ref 32.0–36.0)
MCV: 84.3 fL (ref 80.0–100.0)
MPV: 11.9 fL (ref 7.5–12.5)
Monocytes Relative: 11.4 %
Neutro Abs: 2907 cells/uL (ref 1500–7800)
Neutrophils Relative %: 51 %
Platelets: 153 10*3/uL (ref 140–400)
RBC: 4.4 10*6/uL (ref 3.80–5.10)
RDW: 13.2 % (ref 11.0–15.0)
Total Lymphocyte: 35.1 %
WBC: 5.7 10*3/uL (ref 3.8–10.8)

## 2021-09-28 LAB — HEMOGLOBIN A1C
Hgb A1c MFr Bld: 5.9 % of total Hgb — ABNORMAL HIGH (ref <5.7)
Mean Plasma Glucose: 123 mg/dL
eAG (mmol/L): 6.8 mmol/L

## 2021-09-28 LAB — LIPID PANEL
Cholesterol: 212 mg/dL — ABNORMAL HIGH (ref <200)
HDL: 81 mg/dL (ref >=50)
LDL Cholesterol (Calc): 114 mg/dL (calc) — ABNORMAL HIGH
Non-HDL Cholesterol (Calc): 131 mg/dL (calc) — ABNORMAL HIGH (ref <130)
Total CHOL/HDL Ratio: 2.6 (calc) (ref <5.0)
Triglycerides: 74 mg/dL (ref <150)

## 2021-09-28 LAB — COMPLETE METABOLIC PANEL WITH GFR
AG Ratio: 1.1 (calc) (ref 1.0–2.5)
ALT: 8 U/L (ref 6–29)
AST: 26 U/L (ref 10–35)
Albumin: 3.9 g/dL (ref 3.6–5.1)
Alkaline phosphatase (APISO): 72 U/L (ref 37–153)
BUN: 17 mg/dL (ref 7–25)
CO2: 29 mmol/L (ref 20–32)
Calcium: 9.6 mg/dL (ref 8.6–10.4)
Chloride: 101 mmol/L (ref 98–110)
Creat: 0.74 mg/dL (ref 0.60–0.95)
Globulin: 3.6 g/dL (calc) (ref 1.9–3.7)
Glucose, Bld: 97 mg/dL (ref 65–99)
Potassium: 4.2 mmol/L (ref 3.5–5.3)
Sodium: 137 mmol/L (ref 135–146)
Total Bilirubin: 0.4 mg/dL (ref 0.2–1.2)
Total Protein: 7.5 g/dL (ref 6.1–8.1)
eGFR: 79 mL/min/{1.73_m2} (ref >=60)

## 2021-09-28 LAB — TSH: TSH: 0.5 mIU/L (ref 0.40–4.50)

## 2021-10-30 ENCOUNTER — Other Ambulatory Visit (HOSPITAL_BASED_OUTPATIENT_CLINIC_OR_DEPARTMENT_OTHER): Payer: Self-pay

## 2021-10-30 MED ORDER — COMIRNATY 30 MCG/0.3ML IM SUSY
PREFILLED_SYRINGE | INTRAMUSCULAR | 0 refills | Status: DC
  Filled 2021-10-30: qty 0.3, 1d supply, fill #0

## 2021-12-19 ENCOUNTER — Other Ambulatory Visit: Payer: Self-pay

## 2021-12-19 DIAGNOSIS — E039 Hypothyroidism, unspecified: Secondary | ICD-10-CM

## 2021-12-19 MED ORDER — LEVOTHYROXINE SODIUM 50 MCG PO TABS: ORAL_TABLET | ORAL | 3 refills | Status: DC

## 2022-01-29 DIAGNOSIS — H04123 Dry eye syndrome of bilateral lacrimal glands: Secondary | ICD-10-CM | POA: Diagnosis not present

## 2022-01-29 DIAGNOSIS — H0289 Other specified disorders of eyelid: Secondary | ICD-10-CM | POA: Diagnosis not present

## 2022-01-29 DIAGNOSIS — Z961 Presence of intraocular lens: Secondary | ICD-10-CM | POA: Diagnosis not present

## 2022-01-31 DIAGNOSIS — C50911 Malignant neoplasm of unspecified site of right female breast: Secondary | ICD-10-CM | POA: Diagnosis not present

## 2022-02-01 DIAGNOSIS — C50911 Malignant neoplasm of unspecified site of right female breast: Secondary | ICD-10-CM | POA: Diagnosis not present

## 2022-03-28 ENCOUNTER — Encounter: Payer: Medicare PPO | Admitting: Internal Medicine

## 2022-04-22 ENCOUNTER — Encounter: Payer: Self-pay | Admitting: Internal Medicine

## 2022-04-22 NOTE — Progress Notes (Unsigned)
Annual Screening/Preventative Visit & Comprehensive Evaluation &  Examination   Future Appointments  Date Time Provider Department  04/23/2022 10:00 AM Lucky Cowboy, MD [facility]  09/28/2022 11:00 AM Raynelle Dick, NP [facility]  04/26/2023 11:00 AM Lucky Cowboy, MD [facility]        This very nice 88 y.o. MBF with  HTN, HLD, Prediabetes  and Vitamin D Deficiency  presents for a Screening /Preventative Visit & comprehensive evaluation and management of multiple medical co-morbidities.  Chest X-ray in 2015 showed Aortic Atherosclerosis.        HTN predates since  2000.   Patient's BP has been controlled at home and patient denies any cardiac symptoms as chest pain, palpitations, shortness of breath, dizziness or ankle swelling. Today's BP is at goal -                               .       Patient's hyperlipidemia is  not  controlled with diet & Red Yeast Rice Extract.  Last lipids were not at goal :  Lab Results  Component Value Date   CHOL 212 (H) 09/27/2021   HDL 81 09/27/2021   LDLCALC 114 (H) 09/27/2021   TRIG 74 09/27/2021   CHOLHDL 2.6 09/27/2021         Patient has hx/o prediabetes  (A1c 6.1% /2011 & 6.4% /2012) and patient denies reactive hypoglycemic symptoms, visual blurring, diabetic polys or paresthesias. Last A1c was not at goal :  Lab Results  Component Value Date   HGBA1C 5.9 (H) 09/27/2021                                           In 2011, patient was dx'd with Hypothyroid Goiter & was initiated on replacement therapy.        Finally, patient has history of Vitamin D Deficiency and last Vitamin D was at goal :  Lab Results  Component Value Date   VD25OH 58 03/22/2021       Current Outpatient Medications on File Prior to Visit  Medication Sig   aspirin 81 MG tablet Take daily.   CALCIUM PO Take 1 tablet  daily.   VITAMIN D 2000 UNITS  Take 2,000 Units  3  times daily.   fish oil-omega-3  1000 MG capsule Take 2 g  daily.    levothyroxine  50 MCG tablet Take  1 tablet  Daily     magnesium 30 MG tablet Take 30 mg  2 times daily.   Multiple Vitamin  Take 1 tablet  daily.   Red Yeast Rice 600 MG CAPS Take daily.    vitamin C 500 MG tablet Take daily.     Past Medical History:  Diagnosis Date   Breast cancer (HCC) 08/2011   right   Diverticulosis    Hypothyroidism    Labile hypertension    Prediabetes    Thoracic aorta atherosclerosis (HCC)    via CXR   Vitamin D deficiency      Health Maintenance  Topic Date Due   Zoster Vaccines- Shingrix (1 of 2) Never done   TETANUS/TDAP  09/26/2021 (Originally 05/19/2018)   MAMMOGRAM  08/25/2021   Pneumonia Vaccine 54+ Years old  Completed   INFLUENZA VACCINE  Completed   DEXA SCAN  Completed  COVID-19 Vaccine  Completed   HPV VACCINES  Aged Out     Immunization History  Administered Date(s) Administered   Influenza, High Dose  11/25/2018, 11/25/2019, 11/21/2020   PFIZER  Covid-19 Vacc 06/15/2020   PFIZER SARS-COV-2 Vacc  01/21/2019, 02/10/2019   Pfizer Covid-19 Bivalent Booster  12/08/2020   Pneumococcal -13 03/01/2014   Pneumococcal -23 01/09/2007   Td 05/18/2008    Last Colon - 08/18/2012 - Dr Jarold Motto - hyperplastic polyp - recc 10 yr f/u  In Aug 2024 (& patient would be 88 yo) - deferred due to age   Last MGM - 08/25/2020   Past Surgical History:  Procedure Laterality Date   BREAST SURGERY     right breast   COLONOSCOPY     MASTECTOMY COMPLETE / SIMPLE W/ SENTINEL NODE BIOPSY  08/10/11   right   MASTECTOMY W/ SENTINEL NODE BIOPSY  08/10/2011   Procedure: MASTECTOMY WITH SENTINEL LYMPH NODE BIOPSY;  Surgeon: Shelly Rubenstein, MD;  Location: [facility] OR;  Service: General;  Laterality: Right;     Family History  Problem Relation Age of Onset   Colon cancer Mother 4   Prostate cancer Father    Lung cancer Brother    Lung cancer Sister    Kidney cancer Brother      Social History   Tobacco Use   Smoking status: Never   Smokeless  tobacco: Never  Substance Use Topics   Alcohol use: No   Drug use: No      ROS Constitutional: Denies fever, chills, weight loss/gain, headaches, insomnia,  night sweats, and change in appetite. Does c/o fatigue. Eyes: Denies redness, blurred vision, diplopia, discharge, itchy, watery eyes.  ENT: Denies discharge, congestion, post nasal drip, epistaxis, sore throat, earache, hearing loss, dental pain, Tinnitus, Vertigo, Sinus pain, snoring.  Cardio: Denies chest pain, palpitations, irregular heartbeat, syncope, dyspnea, diaphoresis, orthopnea, PND, claudication, edema Respiratory: denies cough, dyspnea, DOE, pleurisy, hoarseness, laryngitis, wheezing.  Gastrointestinal: Denies dysphagia, heartburn, reflux, water brash, pain, cramps, nausea, vomiting, bloating, diarrhea, constipation, hematemesis, melena, hematochezia, jaundice, hemorrhoids Genitourinary: Denies dysuria, frequency, urgency, nocturia, hesitancy, discharge, hematuria, flank pain Breast: Breast lumps, nipple discharge, bleeding.  Musculoskeletal: Denies arthralgia, myalgia, stiffness, Jt. Swelling, pain, limp, and strain/sprain. Denies falls. Skin: Denies puritis, rash, hives, warts, acne, eczema, changing in skin lesion Neuro: No weakness, tremor, incoordination, spasms, paresthesia, pain Psychiatric: Denies confusion, memory loss, sensory loss. Denies Depression. Endocrine: Denies change in weight, skin, hair change, nocturia, and paresthesia, diabetic polys, visual blurring, hyper / hypo glycemic episodes.  Heme/Lymph: No excessive bleeding, bruising, enlarged lymph nodes.  Physical Exam  There were no vitals taken for this visit.  General Appearance: Well nourished, well groomed and in no apparent distress.  Eyes: PERRLA, EOMs, conjunctiva no swelling or erythema, normal fundi and vessels. Sinuses: No frontal/maxillary tenderness ENT/Mouth: EACs patent / TMs  nl. Nares clear without erythema, swelling, mucoid exudates.  Oral hygiene is good. No erythema, swelling, or exudate. Tongue normal, non-obstructing. Tonsils not swollen or erythematous. Hearing normal.  Neck: Supple, thyroid not palpable. No bruits, nodes or JVD. Respiratory: Respiratory effort normal.  BS equal and clear bilateral without rales, rhonci, wheezing or stridor. Cardio: Heart sounds are normal with regular rate and rhythm and no murmurs, rubs or gallops. Peripheral pulses are normal and equal bilaterally without edema. No aortic or femoral bruits. Chest: symmetric with normal excursions and percussion. Breasts: Symmetric, without lumps, nipple discharge, retractions, or fibrocystic changes.  Abdomen: Flat, soft with bowel sounds  active. Nontender, no guarding, rebound, hernias, masses, or organomegaly.  Lymphatics: Non tender without lymphadenopathy.  Genitourinary:  Musculoskeletal: Full ROM all peripheral extremities, joint stability, 5/5 strength, and normal gait. Skin: Warm and dry without rashes, lesions, cyanosis, clubbing or  ecchymosis.  Neuro: Cranial nerves intact, reflexes equal bilaterally. Normal muscle tone, no cerebellar symptoms. Sensation intact.  Pysch: Alert and oriented X 3, normal affect, Insight and Judgment appropriate.    Assessment and Plan  1. Annual Preventative Screening Examination   2. Labile hypertension  - EKG 12-Lead - Urinalysis, Routine w reflex microscopic - Microalbumin / creatinine urine ratio - CBC with Differential/Platelet - COMPLETE METABOLIC PANEL WITH GFR - Magnesium - TSH  3. Hyperlipidemia, mixed  - EKG 12-Lead - Lipid panel - TSH  4. Abnormal glucose  - EKG 12-Lead - Hemoglobin A1c - Insulin, random  5. Vitamin D deficiency  - VITAMIN D 25 Hydroxy   6. Atherosclerosis of aorta (HCC)  - EKG 12-Lead - Lipid panel  7. Hypothyroidism  - TSH  8. Screening for colorectal cancer  - POC Hemoccult Bld/Stl   9. Aortic atherosclerosis by CXR 04/17/2013  - EKG  12-Lead  10. Screening for ischemic heart disease  - EKG 12-Lead  11. Medication management   - Urinalysis, Routine w reflex microscopic - Microalbumin / creatinine urine ratio - CBC with Differential/Platelet - COMPLETE METABOLIC PANEL WITH GFR - Magnesium - Lipid panel - TSH - Hemoglobin A1c - Insulin, random - VITAMIN D 25 Hydroxy          Patient was counseled in prudent diet to achieve/maintain BMI less than 25 for weight control, BP monitoring, regular exercise and medications. Discussed med's effects and SE's. Screening labs and tests as requested with regular follow-up as recommended. Over 40 minutes of exam, counseling, chart review and high complex critical decision making was performed.   Marinus Maw, MD

## 2022-04-22 NOTE — Patient Instructions (Signed)

## 2022-04-23 ENCOUNTER — Ambulatory Visit (INDEPENDENT_AMBULATORY_CARE_PROVIDER_SITE_OTHER): Payer: Medicare PPO | Admitting: Internal Medicine

## 2022-04-23 ENCOUNTER — Encounter: Payer: Self-pay | Admitting: Internal Medicine

## 2022-04-23 VITALS — BP 136/70 | HR 64 | Temp 98.0°F | Resp 17 | Ht 60.0 in | Wt 140.6 lb

## 2022-04-23 DIAGNOSIS — E559 Vitamin D deficiency, unspecified: Secondary | ICD-10-CM | POA: Diagnosis not present

## 2022-04-23 DIAGNOSIS — E039 Hypothyroidism, unspecified: Secondary | ICD-10-CM | POA: Diagnosis not present

## 2022-04-23 DIAGNOSIS — Z136 Encounter for screening for cardiovascular disorders: Secondary | ICD-10-CM | POA: Diagnosis not present

## 2022-04-23 DIAGNOSIS — Z79899 Other long term (current) drug therapy: Secondary | ICD-10-CM | POA: Diagnosis not present

## 2022-04-23 DIAGNOSIS — Z0001 Encounter for general adult medical examination with abnormal findings: Secondary | ICD-10-CM

## 2022-04-23 DIAGNOSIS — Z0000 Encounter for general adult medical examination without abnormal findings: Secondary | ICD-10-CM

## 2022-04-23 DIAGNOSIS — R0989 Other specified symptoms and signs involving the circulatory and respiratory systems: Secondary | ICD-10-CM

## 2022-04-23 DIAGNOSIS — I700 Atherosclerosis of aorta: Secondary | ICD-10-CM | POA: Diagnosis not present

## 2022-04-23 DIAGNOSIS — R7309 Other abnormal glucose: Secondary | ICD-10-CM

## 2022-04-23 DIAGNOSIS — Z1211 Encounter for screening for malignant neoplasm of colon: Secondary | ICD-10-CM

## 2022-04-23 DIAGNOSIS — E782 Mixed hyperlipidemia: Secondary | ICD-10-CM

## 2022-04-23 DIAGNOSIS — Z853 Personal history of malignant neoplasm of breast: Secondary | ICD-10-CM

## 2022-04-24 LAB — URINALYSIS, ROUTINE W REFLEX MICROSCOPIC
Bacteria, UA: NONE SEEN /HPF
Bilirubin Urine: NEGATIVE
Glucose, UA: NEGATIVE
Hgb urine dipstick: NEGATIVE
Hyaline Cast: NONE SEEN /LPF
Ketones, ur: NEGATIVE
Nitrite: NEGATIVE
Protein, ur: NEGATIVE
RBC / HPF: NONE SEEN /HPF (ref 0–2)
Specific Gravity, Urine: 1.013 (ref 1.001–1.035)
pH: 6 (ref 5.0–8.0)

## 2022-04-24 LAB — CBC WITH DIFFERENTIAL/PLATELET
Absolute Monocytes: 546 cells/uL (ref 200–950)
Basophils Absolute: 31 cells/uL (ref 0–200)
Basophils Relative: 0.5 %
Eosinophils Absolute: 192 cells/uL (ref 15–500)
Eosinophils Relative: 3.1 %
HCT: 36.9 % (ref 35.0–45.0)
Hemoglobin: 12.2 g/dL (ref 11.7–15.5)
Lymphs Abs: 2523 cells/uL (ref 850–3900)
MCH: 28 pg (ref 27.0–33.0)
MCHC: 33.1 g/dL (ref 32.0–36.0)
MCV: 84.8 fL (ref 80.0–100.0)
MPV: 11.9 fL (ref 7.5–12.5)
Monocytes Relative: 8.8 %
Neutro Abs: 2908 cells/uL (ref 1500–7800)
Neutrophils Relative %: 46.9 %
Platelets: 165 10*3/uL (ref 140–400)
RBC: 4.35 10*6/uL (ref 3.80–5.10)
RDW: 13.6 % (ref 11.0–15.0)
Total Lymphocyte: 40.7 %
WBC: 6.2 10*3/uL (ref 3.8–10.8)

## 2022-04-24 LAB — INSULIN, RANDOM: Insulin: 14.4 u[IU]/mL

## 2022-04-24 LAB — LIPID PANEL
Cholesterol: 201 mg/dL — ABNORMAL HIGH (ref <200)
HDL: 84 mg/dL (ref >=50)
LDL Cholesterol (Calc): 100 mg/dL (calc) — ABNORMAL HIGH
Non-HDL Cholesterol (Calc): 117 mg/dL (calc) (ref <130)
Total CHOL/HDL Ratio: 2.4 (calc) (ref <5.0)
Triglycerides: 81 mg/dL (ref <150)

## 2022-04-24 LAB — COMPLETE METABOLIC PANEL WITH GFR
AG Ratio: 1.2 (calc) (ref 1.0–2.5)
ALT: 6 U/L (ref 6–29)
AST: 21 U/L (ref 10–35)
Albumin: 3.8 g/dL (ref 3.6–5.1)
Alkaline phosphatase (APISO): 74 U/L (ref 37–153)
BUN: 22 mg/dL (ref 7–25)
CO2: 29 mmol/L (ref 20–32)
Calcium: 9.5 mg/dL (ref 8.6–10.4)
Chloride: 102 mmol/L (ref 98–110)
Creat: 0.73 mg/dL (ref 0.60–0.95)
Globulin: 3.2 g/dL (calc) (ref 1.9–3.7)
Glucose, Bld: 102 mg/dL — ABNORMAL HIGH (ref 65–99)
Potassium: 4.1 mmol/L (ref 3.5–5.3)
Sodium: 140 mmol/L (ref 135–146)
Total Bilirubin: 0.4 mg/dL (ref 0.2–1.2)
Total Protein: 7 g/dL (ref 6.1–8.1)
eGFR: 81 mL/min/{1.73_m2} (ref >=60)

## 2022-04-24 LAB — HEMOGLOBIN A1C
Hgb A1c MFr Bld: 6.2 %{Hb} — ABNORMAL HIGH
Mean Plasma Glucose: 131 mg/dL
eAG (mmol/L): 7.3 mmol/L

## 2022-04-24 LAB — MICROALBUMIN / CREATININE URINE RATIO
Creatinine, Urine: 82 mg/dL (ref 20–275)
Microalb Creat Ratio: 9 mg/g creat (ref <30)
Microalb, Ur: 0.7 mg/dL

## 2022-04-24 LAB — TSH: TSH: 1.11 mIU/L (ref 0.40–4.50)

## 2022-04-24 LAB — MICROSCOPIC MESSAGE

## 2022-04-24 LAB — VITAMIN D 25 HYDROXY (VIT D DEFICIENCY, FRACTURES): Vit D, 25-Hydroxy: 88 ng/mL (ref 30–100)

## 2022-04-24 LAB — MAGNESIUM: Magnesium: 1.9 mg/dL (ref 1.5–2.5)

## 2022-04-24 NOTE — Progress Notes (Signed)
^<^<^<^<^<^<^<^<^<^<^<^<^<^<^<^<^<^<^<^<^<^<^<^<^<^<^<^<^<^<^<^<^<^<^<^<^ ^>^>^>^>^>^>^>^>^>^>^>>^>^>^>^>^>^>^>^>^>^>^>^>^>^>^>^>^>^>^>^>^>^>^>^>^ -Test results slightly outside the reference range are not unusual. If there is anything important, I will review this with you,  otherwise it is considered normal test values.  If you have further questions,  please do not hesitate to contact me at the office or via My Chart.  ^<^<^<^<^<^<^<^<^<^<^<^<^<^<^<^<^<^<^<^<^<^<^<^<^<^<^<^<^<^<^<^<^<^<^<^<^ ^>^>^>^>^>^>^>^>^>^>^>^>^>^>^>^>^>^>^>^>^>^>^>^>^>^>^>^>^>^>^>^>^>^>^>^>^  -  Total  Chol =    201   -   Slightly  Elevated             (  Ideal  or  Goal is less than 180  !  )  & -  Bad / Dangerous LDL  Chol =      100    - also Slightly  Elevated              (  Ideal  or  Goal is less than 70  !  )    - So Recommend a stricter  low cholesterol diet   - Cholesterol only comes from animal sources                                                                                  - ie. meat, dairy, egg yolks  - Eat all the vegetables you want.  - Avoid Meat, Avoid Meat,  Avoid Meat                                                             - especially Red Meat - Beef AND Pork .  - Avoid cheese & dairy - milk & ice cream.     - Cheese is the most concentrated form of trans-fats which is  the worst thing to clog up our arteries                                                                                                                                                                                                                                                                                                                                                                                        -  Veggie cheese is OK which can be found in the fresh                          produce section at [employer] or [employer] or  [employer] ^>^>^>^>^>^>^>^>^>^>^>^>^>^>^>^>^>^>^>^>^>^>^>^>^>^>^>^>^>^>^>^>^>^>^>^>^  - A1c is higher - up from 5.9% to now 6.2% , So  . . . . Marland Kitchen   - Avoid Sweets, Candy & White Stuff   - White Rice, White [place], White Flour  - Breads &  Pasta ^>^>^>^>^>^>^>^>^>^>^>^>^>^>^>^>^>^>^>^>^>^>^>^>^>^>^>^>^>^>^>^>^>^>^>^>^  -   Vitamin D = 88  - Excellent  ^>^>^>^>^>^>^>^>^>^>^>^>^>^>^>^>^>^>^>^>^>^>^>^>^>^>^>^>^>^>^>^>^>^>^>^>^  -  All Else - CBC - Kidneys - Electrolytes - Liver - Magnesium & Thyroid    - all  Normal / OK  ^>^>^>^>^>^>^>^>^>^>^>^>^>^>^>^>^>^>^>^>^>^>^>^>^>^>^>^>^>^>^>^>^>^>^>^>^ ^>^>^>^>^>^>^>^>^>^>^>^>^>^>^>^>^>^>^>^>^>^>^>^>^>^>^>^>^>^>^>^>^>^>^>^>^

## 2022-06-18 ENCOUNTER — Other Ambulatory Visit: Payer: Self-pay

## 2022-06-18 DIAGNOSIS — Z1211 Encounter for screening for malignant neoplasm of colon: Secondary | ICD-10-CM | POA: Diagnosis not present

## 2022-06-18 DIAGNOSIS — Z1212 Encounter for screening for malignant neoplasm of rectum: Secondary | ICD-10-CM | POA: Diagnosis not present

## 2022-06-18 LAB — POC HEMOCCULT BLD/STL (HOME/3-CARD/SCREEN)
Card #2 Fecal Occult Blod, POC: NEGATIVE
Card #3 Fecal Occult Blood, POC: NEGATIVE
Fecal Occult Blood, POC: NEGATIVE

## 2022-07-24 NOTE — Progress Notes (Unsigned)
MEDICARE ANNUAL WELLNESS VISIT AND FOLLOW UP  Assessment:   Diagnoses and all orders for this visit:  Encounter for Medicare annual wellness exam [place]  Labile hypertension (2000) Currently on no medication.   Monitor blood pressure at home; call if consistently over 130/80 Continue DASH diet.   Reminder to go to the ER if any CP, SOB, nausea, dizziness, severe HA, changes vision/speech, left arm numbness and tingling and jaw pain. CBC  Hyperlipidemia Last check was not at goal; continue with red yeast rice supplement Continue low cholesterol diet and exercise.  Lipid panel CMP  Atherosclerosis of aorta (HCC) Control blood pressure, lipids and glucose Disscused lifestyle modifications, diet & exercise Continue to monitor  Hypothyroidism, unspecified type Taking levothyroxine 50 mcg daily Reminder to take on an empty stomach 30-60mins before first meal of the day. No antacid medications for 4 hours. TSH  Diverticulosis Increase fiber Bbowel management discussed Continue to monitor  Vitamin D deficiency At goal at recent check; continue to recommend supplementation for goal of 60-100 Defer vitamin D level  History of breast cancer,  S/P mastectomy, annual MMGs, has been released by oncology 07/2019 Mammogram 08/2020 at [facility] reported normal by patient  Abnormal Glucose (06/2009) Discussed disease and risks Discussed diet/exercise, weight management  A1C yearly   Medication management Continued  Overweight, BMI 27 Long discussion about weight loss, diet, and exercise Recommended diet heavy in fruits and veggies and low in animal meats, cheeses, and dairy products, appropriate calorie intake Patient will work on portions, cutting down on carbs Follow up at next visit   Over 40 minutes of face to face interview, exam, counseling, chart review and critical decision making was performed.  Future Appointments  Date Time Provider Department Center   10/25/2022 11:30 AM Raynelle Dick, NP [facility] None  04/26/2023 11:00 AM Lucky Cowboy, MD [facility] None  07/30/2023 10:30 AM Raynelle Dick, NP [facility] None     Plan:   During the course of the visit the patient was educated and counseled about appropriate screening and preventive services including:   Pneumococcal vaccine  Prevnar 13 Influenza vaccine Td vaccine Screening electrocardiogram Bone densitometry screening Colorectal cancer screening Diabetes screening Glaucoma screening Nutrition counseling  Advanced directives: requested   Subjective:  Robin Huynh is a 88 y.o. female who presents for Medicare Annual Wellness Visit and 6 month follow up.   She has hx/o R Mastectomy,  Rt Breast DCIS (ER+), 2013,  and is followed by Dr Pamelia Hoit, she finished up a 5 year course in 2019 of Tamoxifen with no recurrence by annual mammograms. Has been released.  Continues yearly mammograms. Last mammogram 08/31/21 was negative  BMI is Body mass index is 27.22 kg/m., she has been working on diet and exercise. She has been focusing on fruits and vegetables and has been cutting down on carbohydrates. She walks 30-60 min most days.   Wt Readings from Last 3 Encounters:  07/25/22 139 lb 6.4 oz (63.2 kg)  04/23/22 140 lb 9.6 oz (63.8 kg)  09/27/21 140 lb 12.8 oz (63.9 kg)    HTN predates 2000  Her blood pressure has been controlled at home without medication, today their BP is BP: 132/68  BP Readings from Last 3 Encounters:  07/25/22 132/68  04/23/22 136/70  09/27/21 132/80  She does workout. She denies chest pain, shortness of breath, dizziness.    She is not on cholesterol medication (on red yeast rice and Omega3 2,000mg  daily. Her cholesterol is not  at goal. Limiting red meat, no fried foods, limiting dairy.  She does eat eggs daily. The cholesterol last visit was:   Lab Results  Component Value Date   CHOL 201 (H) 04/23/2022   HDL 84 04/23/2022   LDLCALC 100 (H)  04/23/2022   TRIG 81 04/23/2022   CHOLHDL 2.4 04/23/2022    She has been working on diet and exercise for prediabetes/abnormal glucose, (A1c 6.1%, 2011 & 6.4%, 2012) and denies foot ulcerations, hyperglycemia, increased appetite, nausea, paresthesia of the feet, polydipsia, polyuria, visual disturbances, vomiting and weight loss. Last A1C in the office was:  Lab Results  Component Value Date   HGBA1C 6.2 (H) 04/23/2022   She is on thyroid medication and taking levothyroxine daily.Marland Kitchen Her medication was not changed last visit.   Lab Results  Component Value Date   TSH 1.11 04/23/2022  . Last GFR: Lab Results  Component Value Date   EGFR 81 04/23/2022     Patient is on Vitamin D supplement, 6,000IU daily, and at goal last check:    Lab Results  Component Value Date   VD25OH 88 04/23/2022      Medication Review: Current Outpatient Medications on File Prior to Visit  Medication Sig Dispense Refill   aspirin 81 MG tablet Take 81 mg by mouth daily.     CALCIUM PO Take 1 tablet by mouth daily.     Cholecalciferol (VITAMIN D) 2000 UNITS tablet Take 2,000 Units by mouth 3 (three) times daily.     fish oil-omega-3 fatty acids 1000 MG capsule Take 2 g by mouth daily.     levothyroxine (EUTHYROX) 50 MCG tablet Take  1 tablet  Daily  on an empty stomach with only water for 30 minutes & no Antacid meds, Calcium or Magnesium for 4 hours & avoid Biotin 90 tablet 3   magnesium 30 MG tablet Take 30 mg by mouth 2 (two) times daily.     Multiple Vitamin (MULTIVITAMIN) tablet Take 1 tablet by mouth daily.     Red Yeast Rice 600 MG CAPS Take 600 mg by mouth daily.      vitamin C (ASCORBIC ACID) 500 MG tablet Take 500 mg by mouth daily.     COVID-19 mRNA vaccine 2023-2024 (COMIRNATY) syringe Inject into the muscle. 0.3 mL 0   No current facility-administered medications on file prior to visit.    No Known Allergies  Current Problems (verified) Patient Active Problem List   Diagnosis Date  Noted   Aortic atherosclerosis by CXR 04/17/2013 03/14/2020   Obesity (BMI 30.0-34.9) 05/15/2018   Encounter for Medicare annual wellness exam 10/07/2014   Hyperlipidemia 07/15/2013   Medication management 06/30/2013   Labile hypertension (2000)    Hypothyroidism    Other abnormal glucose (prediabetes)    Vitamin D deficiency    Diverticulosis    Primary cancer of lower outer quadrant of right female breast (HCC) 06/21/2011    Screening Tests Immunization History  Administered Date(s) Administered   COVID-19, mRNA, vaccine(Comirnaty)12 years and older 10/30/2021   Influenza, High Dose Seasonal PF 11/25/2018, 11/25/2019, 11/21/2020, 09/27/2021   PFIZER Comirnaty(Gray Top)Covid-19 Tri-Sucrose Vaccine 06/15/2020   PFIZER(Purple Top)SARS-COV-2 Vaccination 01/21/2019, 02/10/2019   Pfizer Covid-19 Vaccine Bivalent Booster 59yrs & up 12/08/2020   Pneumococcal Conjugate-13 03/01/2014   Pneumococcal Polysaccharide-23 01/09/2007   Td 05/18/2008    Health Maintenance  Topic Date Due   Zoster Vaccines- Shingrix (1 of 2) Never done   COVID-19 Vaccine (6 - 2023-24 season) 08/10/2022 (Originally  12/25/2021)   INFLUENZA VACCINE  08/09/2022   MAMMOGRAM  09/01/2022   Medicare Annual Wellness (AWV)  07/25/2023   Pneumonia Vaccine 50+ Years old  Completed   DEXA SCAN  Completed   HPV VACCINES  Aged Out   DTaP/Tdap/Td  Discontinued     Names of Other Physician/Practitioners you currently use: 1. [facility]- here for primary care 2. Dr. Dione Booze, eye doctor, last visit 2023 3. Dr.  Toni Arthurs, dentist, last visit 2024, goes q69m  Patient Care Team: Lucky Cowboy, MD as PCP - General (Internal Medicine) Serena Croissant, MD as Consulting Physician (Hematology and Oncology) Mardella Layman, MD as Consulting Physician (Gastroenterology) Abigail Miyamoto, MD as Consulting Physician (General Surgery)  SURGICAL HISTORY She  has a past surgical history that  includes Breast surgery; Colonoscopy; Mastectomy complete / simple w/ sentinel node biopsy (08/10/11); and Mastectomy w/ sentinel node biopsy (08/10/2011). FAMILY HISTORY Her family history includes Colon cancer (age of onset: 33) in her mother; Kidney cancer in her brother; Lung cancer in her brother and sister; Prostate cancer in her father. SOCIAL HISTORY She  reports that she has never smoked. She has never used smokeless tobacco. She reports that she does not drink alcohol and does not use drugs.   MEDICARE WELLNESS OBJECTIVES: Physical activity:  Walking 30 minutes 6-7 days a week Cardiac risk factors: Cardiac Risk Factors include: advanced age (>60men, >69 women);dyslipidemia;hypertension Depression/mood screen:      07/25/2022   10:20 AM  Depression screen PHQ 2/9  Decreased Interest 0  Down, Depressed, Hopeless 0  PHQ - 2 Score 0    ADLs:     07/25/2022   10:18 AM 04/23/2022   11:20 AM  In your present state of health, do you have any difficulty performing the following activities:  Hearing? 0 0  Vision? 0 0  Difficulty concentrating or making decisions? 0 0  Walking or climbing stairs? 0 0  Dressing or bathing? 0 0  Doing errands, shopping? 0 0     Cognitive Testing  Alert? Yes  Normal Appearance? n/a  Oriented to person? Yes  Place? Yes   Time? Yes  Recall of three objects?  Yes  Can perform simple calculations? Yes  Displays appropriate judgment?Yes  Can read the correct time from a watch face? n/a  EOL planning: Does Patient Have a Medical Advance Directive?: No Would patient like information on creating a medical advance directive?: No - Patient declined  Review of Systems  Constitutional:  Negative for chills, fever and weight loss.  HENT:  Negative for congestion and hearing loss.   Eyes:  Negative for blurred vision and double vision.  Respiratory:  Negative for cough and shortness of breath.   Cardiovascular:  Negative for chest pain, palpitations,  orthopnea and leg swelling.  Gastrointestinal:  Negative for abdominal pain, constipation, diarrhea, heartburn, nausea and vomiting.  Musculoskeletal:  Negative for falls, joint pain and myalgias.  Skin:  Negative for rash.  Neurological:  Negative for dizziness, tingling, tremors, loss of consciousness and headaches.  Psychiatric/Behavioral:  Negative for depression, memory loss and suicidal ideas.      Objective:     Today's Vitals   07/25/22 1005  BP: 132/68  Pulse: 63  Temp: (!) 97.5 F (36.4 C)  SpO2: 99%  Weight: 139 lb 6.4 oz (63.2 kg)  Height: 5' (1.524 m)     Body mass index is 27.22 kg/m. General Appearance: Well nourished, in no apparent distress. Eyes: PERRLA,  EOMs, conjunctiva no swelling or erythema Sinuses: No Frontal/maxillary tenderness ENT/Mouth: Ext aud canals clear, TMs without erythema, bulging. No erythema, swelling, or exudate on post pharynx.  Tonsils not swollen or erythematous. Hearing normal.  Neck: Supple, thyroid normal.  Respiratory: Respiratory effort normal, BS equal bilaterally without rales, rhonchi, wheezing or stridor.  Cardio: RRR with no MRGs. Brisk peripheral pulses without edema.  Abdomen: Soft, + BS.  Non tender, no guarding, rebound, hernias, masses. Lymphatics: Non tender without lymphadenopathy.  Musculoskeletal: Full ROM, 5/5 strength, Normal gait Skin: Warm, dry without rashes, lesions, ecchymosis.  Neuro: Cranial nerves intact. No cerebellar symptoms.  Psych: Awake and oriented X 3, normal affect, Insight and Judgment appropriate.    Medicare Attestation I have personally reviewed: The patient's medical and social history Their use of alcohol, tobacco or illicit drugs Their current medications and supplements The patient's functional ability including ADLs,fall risks, home safety risks, cognitive, and hearing and visual impairment Diet and physical activities Evidence for depression or mood disorders  The patient's  weight, height, BMI, and visual acuity have been recorded in the chart.  I have made referrals, counseling, and provided education to the patient based on review of the above and I have provided the patient with a written personalized care plan for preventive services.     Raynelle Dick, NP   09/25/2019

## 2022-07-25 ENCOUNTER — Encounter: Payer: Self-pay | Admitting: Nurse Practitioner

## 2022-07-25 ENCOUNTER — Ambulatory Visit: Payer: Medicare PPO | Admitting: Nurse Practitioner

## 2022-07-25 VITALS — BP 132/68 | HR 63 | Temp 97.5°F | Ht 60.0 in | Wt 139.4 lb

## 2022-07-25 DIAGNOSIS — E559 Vitamin D deficiency, unspecified: Secondary | ICD-10-CM | POA: Diagnosis not present

## 2022-07-25 DIAGNOSIS — Z79899 Other long term (current) drug therapy: Secondary | ICD-10-CM | POA: Diagnosis not present

## 2022-07-25 DIAGNOSIS — I700 Atherosclerosis of aorta: Secondary | ICD-10-CM | POA: Diagnosis not present

## 2022-07-25 DIAGNOSIS — Z0001 Encounter for general adult medical examination with abnormal findings: Secondary | ICD-10-CM | POA: Diagnosis not present

## 2022-07-25 DIAGNOSIS — E782 Mixed hyperlipidemia: Secondary | ICD-10-CM

## 2022-07-25 DIAGNOSIS — E039 Hypothyroidism, unspecified: Secondary | ICD-10-CM

## 2022-07-25 DIAGNOSIS — R6889 Other general symptoms and signs: Secondary | ICD-10-CM

## 2022-07-25 DIAGNOSIS — C50511 Malignant neoplasm of lower-outer quadrant of right female breast: Secondary | ICD-10-CM | POA: Diagnosis not present

## 2022-07-25 DIAGNOSIS — R7309 Other abnormal glucose: Secondary | ICD-10-CM | POA: Diagnosis not present

## 2022-07-25 DIAGNOSIS — E663 Overweight: Secondary | ICD-10-CM

## 2022-07-25 DIAGNOSIS — Z6828 Body mass index (BMI) 28.0-28.9, adult: Secondary | ICD-10-CM

## 2022-07-25 DIAGNOSIS — R0989 Other specified symptoms and signs involving the circulatory and respiratory systems: Secondary | ICD-10-CM

## 2022-07-25 DIAGNOSIS — Z0000 Encounter for general adult medical examination without abnormal findings: Secondary | ICD-10-CM

## 2022-07-25 NOTE — Patient Instructions (Signed)

## 2022-07-26 LAB — COMPLETE METABOLIC PANEL WITH GFR
AG Ratio: 1.1 (calc) (ref 1.0–2.5)
ALT: 8 U/L (ref 6–29)
AST: 23 U/L (ref 10–35)
Albumin: 3.7 g/dL (ref 3.6–5.1)
Alkaline phosphatase (APISO): 74 U/L (ref 37–153)
BUN: 20 mg/dL (ref 7–25)
CO2: 29 mmol/L (ref 20–32)
Calcium: 9.2 mg/dL (ref 8.6–10.4)
Chloride: 103 mmol/L (ref 98–110)
Creat: 0.73 mg/dL (ref 0.60–0.95)
Globulin: 3.5 g/dL (calc) (ref 1.9–3.7)
Glucose, Bld: 87 mg/dL (ref 65–99)
Potassium: 4.4 mmol/L (ref 3.5–5.3)
Sodium: 137 mmol/L (ref 135–146)
Total Bilirubin: 0.3 mg/dL (ref 0.2–1.2)
Total Protein: 7.2 g/dL (ref 6.1–8.1)
eGFR: 80 mL/min/{1.73_m2} (ref >=60)

## 2022-07-26 LAB — CBC WITH DIFFERENTIAL/PLATELET
Absolute Monocytes: 520 cells/uL (ref 200–950)
Basophils Absolute: 30 cells/uL (ref 0–200)
Basophils Relative: 0.6 %
Eosinophils Absolute: 110 cells/uL (ref 15–500)
Eosinophils Relative: 2.2 %
HCT: 38.3 % (ref 35.0–45.0)
Hemoglobin: 12.1 g/dL (ref 11.7–15.5)
Lymphs Abs: 1845 cells/uL (ref 850–3900)
MCH: 27.2 pg (ref 27.0–33.0)
MCHC: 31.6 g/dL — ABNORMAL LOW (ref 32.0–36.0)
MCV: 86.1 fL (ref 80.0–100.0)
MPV: 11.9 fL (ref 7.5–12.5)
Monocytes Relative: 10.4 %
Neutro Abs: 2495 cells/uL (ref 1500–7800)
Neutrophils Relative %: 49.9 %
Platelets: 152 10*3/uL (ref 140–400)
RBC: 4.45 10*6/uL (ref 3.80–5.10)
RDW: 12.8 % (ref 11.0–15.0)
Total Lymphocyte: 36.9 %
WBC: 5 10*3/uL (ref 3.8–10.8)

## 2022-07-26 LAB — LIPID PANEL
Cholesterol: 206 mg/dL — ABNORMAL HIGH (ref <200)
HDL: 74 mg/dL (ref >=50)
LDL Cholesterol (Calc): 116 mg/dL (calc) — ABNORMAL HIGH
Non-HDL Cholesterol (Calc): 132 mg/dL (calc) — ABNORMAL HIGH (ref <130)
Total CHOL/HDL Ratio: 2.8 (calc) (ref <5.0)
Triglycerides: 70 mg/dL (ref <150)

## 2022-07-26 LAB — TSH: TSH: 0.54 mIU/L (ref 0.40–4.50)

## 2022-09-06 DIAGNOSIS — Z1231 Encounter for screening mammogram for malignant neoplasm of breast: Secondary | ICD-10-CM | POA: Diagnosis not present

## 2022-09-06 LAB — HM MAMMOGRAPHY

## 2022-09-19 ENCOUNTER — Encounter: Payer: Self-pay | Admitting: Internal Medicine

## 2022-09-28 ENCOUNTER — Ambulatory Visit: Payer: Medicare PPO | Admitting: Nurse Practitioner

## 2022-10-25 ENCOUNTER — Encounter: Payer: Self-pay | Admitting: Nurse Practitioner

## 2022-10-25 ENCOUNTER — Ambulatory Visit: Payer: Medicare PPO | Admitting: Nurse Practitioner

## 2022-10-25 VITALS — BP 124/62 | HR 59 | Temp 97.5°F | Ht 60.0 in | Wt 137.4 lb

## 2022-10-25 DIAGNOSIS — I700 Atherosclerosis of aorta: Secondary | ICD-10-CM | POA: Diagnosis not present

## 2022-10-25 DIAGNOSIS — E782 Mixed hyperlipidemia: Secondary | ICD-10-CM

## 2022-10-25 DIAGNOSIS — Z79899 Other long term (current) drug therapy: Secondary | ICD-10-CM | POA: Diagnosis not present

## 2022-10-25 DIAGNOSIS — E039 Hypothyroidism, unspecified: Secondary | ICD-10-CM

## 2022-10-25 DIAGNOSIS — E559 Vitamin D deficiency, unspecified: Secondary | ICD-10-CM | POA: Diagnosis not present

## 2022-10-25 DIAGNOSIS — Z23 Encounter for immunization: Secondary | ICD-10-CM | POA: Diagnosis not present

## 2022-10-25 DIAGNOSIS — R0989 Other specified symptoms and signs involving the circulatory and respiratory systems: Secondary | ICD-10-CM | POA: Diagnosis not present

## 2022-10-25 DIAGNOSIS — Z6827 Body mass index (BMI) 27.0-27.9, adult: Secondary | ICD-10-CM

## 2022-10-25 DIAGNOSIS — R7309 Other abnormal glucose: Secondary | ICD-10-CM

## 2022-10-25 DIAGNOSIS — H6122 Impacted cerumen, left ear: Secondary | ICD-10-CM

## 2022-10-25 DIAGNOSIS — C50511 Malignant neoplasm of lower-outer quadrant of right female breast: Secondary | ICD-10-CM | POA: Diagnosis not present

## 2022-10-25 DIAGNOSIS — E663 Overweight: Secondary | ICD-10-CM

## 2022-10-25 MED ORDER — LEVOTHYROXINE SODIUM 50 MCG PO TABS
ORAL_TABLET | ORAL | 3 refills | Status: DC
Start: 2022-10-25 — End: 2023-03-11

## 2022-10-25 NOTE — Patient Instructions (Signed)

## 2022-10-25 NOTE — Progress Notes (Signed)
3 MONTH FOLLOW UP  Assessment:   Diagnoses and all orders for this visit:  Labile hypertension (2000) Currently on no medication.   Monitor blood pressure at home; call if consistently over 130/80 Continue DASH diet.   Reminder to go to the ER if any CP, SOB, nausea, dizziness, severe HA, changes vision/speech, left arm numbness and tingling and jaw pain. CBC  Hyperlipidemia Last check was not at goal; continue with red yeast rice supplement Continue low cholesterol diet and exercise.  Lipid panel CMP  Atherosclerosis of aorta (HCC) Control blood pressure, lipids and glucose Disscused lifestyle modifications, diet & exercise Continue to monitor  Diverticulosis Increase fiber Bowel management discussed Continue to monitor  Vitamin D deficiency At goal at recent check; continue to recommend supplementation for goal of 60-100 Defer vitamin D level  History of breast cancer,  S/P mastectomy, annual MMGs, has been released by oncology Mammogram 08/17/2022 of left breast is benign  Abnormal Glucose (06/2009) Discussed disease and risks Discussed diet/exercise, weight management  A1C yearly, CMP   Overweight, BMI 27 Long discussion about weight loss, diet, and exercise Recommended diet heavy in fruits and veggies and low in animal meats, cheeses, and dairy products, appropriate calorie intake Patient will work on portions, cutting down on carbs Follow up at next visit  Hypothyroidism, unspecified type Taking levothyroxine 50 mcg daily Reminder to take on an empty stomach 30-68mins before first meal of the day. No antacid medications for 4 hours. -     TSH -     levothyroxine (EUTHYROX) 50 MCG tablet; Take  1 tablet  Daily  on an empty stomach with only water for 30 minutes & no Antacid meds, Calcium or Magnesium for 4 hours & avoid Biotin  Medication management -     CBC with Differential/Platelet -     COMPLETE METABOLIC PANEL WITH GFR -     Lipid panel -      TSH  Flu vaccine need -     Flu vaccine HIGH DOSE PF   Impacted cerumen of left ear Ear lavage performed with removal of dried wax- TM normal  Over 40 minutes of face to face interview, exam, counseling, chart review and critical decision making was performed.  Future Appointments  Date Time Provider Department Center  04/26/2023 11:00 AM Lucky Cowboy, MD [facility] None  07/30/2023 10:30 AM Raynelle Dick, NP [facility] None       Subjective:  Robin Huynh is a 88 y.o. female who presents for Medicare Annual Wellness Visit and 6 month follow up.   She has hx/o R Mastectomy,  Rt Breast DCIS (ER+), 2013,  and is followed by Dr Pamelia Hoit, she finished up a 5 year course of Tamoxifen in 2019  with no recurrence by annual mammograms. Has been released.  Continues yearly mammograms. Last mammogram 08/17/22 was negative  Her allergies have been bothering her. She has been coughing up some phlegm in the morning that is white/clear. She uses saline nasal spray and zyrtec. Feels that is helping  BMI is Body mass index is 26.83 kg/m., she has been working on diet and exercise. She has been focusing on fruits and vegetables and has been cutting down on carbohydrates. She walks 30-60 min most days.   Wt Readings from Last 3 Encounters:  10/25/22 137 lb 6.4 oz (62.3 kg)  07/25/22 139 lb 6.4 oz (63.2 kg)  04/23/22 140 lb 9.6 oz (63.8 kg)    HTN predates 2000  Her  blood pressure has been controlled at home without medication, today their BP is BP: 124/62  BP Readings from Last 3 Encounters:  10/25/22 124/62  07/25/22 132/68  04/23/22 136/70  She does workout. She denies chest pain, shortness of breath, dizziness.    She is not on cholesterol medication (on red yeast rice and Omega3 2,000mg  daily. Her cholesterol is not at goal. Limiting red meat, no fried foods, limiting dairy.  She does eat eggs daily. The cholesterol last visit was:   Lab Results  Component Value Date   CHOL 206  (H) 07/25/2022   HDL 74 07/25/2022   LDLCALC 116 (H) 07/25/2022   TRIG 70 07/25/2022   CHOLHDL 2.8 07/25/2022    She has been working on diet and exercise for prediabetes/abnormal glucose, (A1c 6.1%, 2011 & 6.4%, 2012) and denies foot ulcerations, hyperglycemia, increased appetite, nausea, paresthesia of the feet, polydipsia, polyuria, visual disturbances, vomiting and weight loss. Last A1C in the office was:  Lab Results  Component Value Date   HGBA1C 6.2 (H) 04/23/2022   She is on thyroid medication and taking levothyroxine daily.Marland Kitchen Her medication was not changed last visit.   Lab Results  Component Value Date   TSH 0.54 07/25/2022  . Last GFR: Lab Results  Component Value Date   EGFR 80 07/25/2022     Patient is on Vitamin D supplement, 6,000IU daily, and at goal last check:    Lab Results  Component Value Date   VD25OH 88 04/23/2022      Medication Review: Current Outpatient Medications on File Prior to Visit  Medication Sig Dispense Refill   aspirin 81 MG tablet Take 81 mg by mouth daily.     CALCIUM PO Take 1 tablet by mouth daily.     cetirizine (ZYRTEC ALLERGY) 10 MG tablet Take 10 mg by mouth daily.     Cholecalciferol (VITAMIN D) 2000 UNITS tablet Take 2,000 Units by mouth 3 (three) times daily.     fish oil-omega-3 fatty acids 1000 MG capsule Take 2 g by mouth daily.     levothyroxine (EUTHYROX) 50 MCG tablet Take  1 tablet  Daily  on an empty stomach with only water for 30 minutes & no Antacid meds, Calcium or Magnesium for 4 hours & avoid Biotin 90 tablet 3   magnesium 30 MG tablet Take 30 mg by mouth 2 (two) times daily.     Multiple Vitamin (MULTIVITAMIN) tablet Take 1 tablet by mouth daily.     Red Yeast Rice 600 MG CAPS Take 600 mg by mouth daily.      vitamin C (ASCORBIC ACID) 500 MG tablet Take 500 mg by mouth daily.     COVID-19 mRNA vaccine 2023-2024 (COMIRNATY) syringe Inject into the muscle. (Patient not taking: Reported on 10/25/2022) 0.3 mL 0    No current facility-administered medications on file prior to visit.    No Known Allergies  Current Problems (verified) Patient Active Problem List   Diagnosis Date Noted   Aortic atherosclerosis by CXR 04/17/2013 03/14/2020   Obesity (BMI 30.0-34.9) 05/15/2018   Encounter for Medicare annual wellness exam 10/07/2014   Hyperlipidemia 07/15/2013   Medication management 06/30/2013   Labile hypertension (2000)    Hypothyroidism    Other abnormal glucose (prediabetes)    Vitamin D deficiency    Diverticulosis    Primary cancer of lower outer quadrant of right female breast (HCC) 06/21/2011    Names of Other Physician/Practitioners you currently use: 1. [facility]- here for primary care 2. Dr. Dione Booze, eye doctor, last visit 2023 3. Dr.  Toni Arthurs, dentist, last visit 2024, goes q45m  Patient Care Team: Lucky Cowboy, MD as PCP - General (Internal Medicine) Serena Croissant, MD as Consulting Physician (Hematology and Oncology) Mardella Layman, MD as Consulting Physician (Gastroenterology) Abigail Miyamoto, MD as Consulting Physician (General Surgery)  SURGICAL HISTORY She  has a past surgical history that includes Breast surgery; Colonoscopy; Mastectomy complete / simple w/ sentinel node biopsy (08/10/11); and Mastectomy w/ sentinel node biopsy (08/10/2011). FAMILY HISTORY Her family history includes Colon cancer (age of onset: 86) in her mother; Kidney cancer in her brother; Lung cancer in her brother and sister; Prostate cancer in her father. SOCIAL HISTORY She  reports that she has never smoked. She has never used smokeless tobacco. She reports that she does not drink alcohol and does not use drugs.   Review of Systems  Constitutional:  Negative for chills, fever and weight loss.  HENT:  Positive for congestion (due to allergies). Negative for hearing loss.   Eyes:  Negative for blurred vision and double vision.  Respiratory:  Positive  for cough (with allergies). Negative for shortness of breath.   Cardiovascular:  Negative for chest pain, palpitations, orthopnea and leg swelling.  Gastrointestinal:  Negative for abdominal pain, constipation, diarrhea, heartburn, nausea and vomiting.  Musculoskeletal:  Negative for falls, joint pain and myalgias.  Skin:  Negative for rash.  Neurological:  Negative for dizziness, tingling, tremors, loss of consciousness and headaches.  Psychiatric/Behavioral:  Negative for depression, memory loss and suicidal ideas.      Objective:     Today's Vitals   10/25/22 1124  BP: 124/62  Pulse: (!) 59  Temp: (!) 97.5 F (36.4 C)  SpO2: 98%  Weight: 137 lb 6.4 oz (62.3 kg)  Height: 5' (1.524 m)      Body mass index is 26.83 kg/m. General Appearance: Well nourished, in no apparent distress. Eyes: PERRLA, EOMs, conjunctiva no swelling or erythema Sinuses: No Frontal/maxillary tenderness ENT/Mouth: Ext aud canals clear, L TM cerumen covering TM- ear lavage performed and TM no bulging or erythema. R TM  without erythema, bulging. No erythema, swelling, or exudate on post pharynx. Hard of hearing Neck: Supple, thyroid normal.  Respiratory: Respiratory effort normal, BS equal bilaterally without rales, rhonchi, wheezing or stridor.  Cardio: RRR with no MRGs. Brisk peripheral pulses without edema.  Abdomen: Soft, + BS.   Lymphatics: Non tender without lymphadenopathy.  Musculoskeletal: Full ROM, 5/5 strength, Normal gait Skin: Warm, dry without rashes, lesions, ecchymosis.  Neuro: Cranial nerves intact. No cerebellar symptoms.  Psych: Awake and oriented X 3, normal affect, Insight and Judgment appropriate.       Raynelle Dick, NP   09/25/2019

## 2022-10-26 LAB — CBC WITH DIFFERENTIAL/PLATELET
Absolute Lymphocytes: 2304 {cells}/uL (ref 850–3900)
Absolute Monocytes: 600 {cells}/uL (ref 200–950)
Basophils Absolute: 42 {cells}/uL (ref 0–200)
Basophils Relative: 0.7 %
Eosinophils Absolute: 138 {cells}/uL (ref 15–500)
Eosinophils Relative: 2.3 %
HCT: 38.3 % (ref 35.0–45.0)
Hemoglobin: 12.3 g/dL (ref 11.7–15.5)
MCH: 27.6 pg (ref 27.0–33.0)
MCHC: 32.1 g/dL (ref 32.0–36.0)
MCV: 86.1 fL (ref 80.0–100.0)
MPV: 11.7 fL (ref 7.5–12.5)
Monocytes Relative: 10 %
Neutro Abs: 2916 {cells}/uL (ref 1500–7800)
Neutrophils Relative %: 48.6 %
Platelets: 198 10*3/uL (ref 140–400)
RBC: 4.45 10*6/uL (ref 3.80–5.10)
RDW: 13.2 % (ref 11.0–15.0)
Total Lymphocyte: 38.4 %
WBC: 6 10*3/uL (ref 3.8–10.8)

## 2022-10-26 LAB — LIPID PANEL
Cholesterol: 197 mg/dL (ref <200)
HDL: 69 mg/dL (ref >=50)
LDL Cholesterol (Calc): 112 mg/dL — ABNORMAL HIGH
Non-HDL Cholesterol (Calc): 128 mg/dL (ref <130)
Total CHOL/HDL Ratio: 2.9 (calc) (ref <5.0)
Triglycerides: 69 mg/dL (ref <150)

## 2022-10-26 LAB — COMPLETE METABOLIC PANEL WITH GFR
AG Ratio: 1 (calc) (ref 1.0–2.5)
ALT: 6 U/L (ref 6–29)
AST: 23 U/L (ref 10–35)
Albumin: 3.6 g/dL (ref 3.6–5.1)
Alkaline phosphatase (APISO): 72 U/L (ref 37–153)
BUN: 19 mg/dL (ref 7–25)
CO2: 28 mmol/L (ref 20–32)
Calcium: 9.2 mg/dL (ref 8.6–10.4)
Chloride: 102 mmol/L (ref 98–110)
Creat: 0.9 mg/dL (ref 0.60–0.95)
Globulin: 3.5 g/dL (ref 1.9–3.7)
Glucose, Bld: 82 mg/dL (ref 65–99)
Potassium: 4.5 mmol/L (ref 3.5–5.3)
Sodium: 137 mmol/L (ref 135–146)
Total Bilirubin: 0.4 mg/dL (ref 0.2–1.2)
Total Protein: 7.1 g/dL (ref 6.1–8.1)
eGFR: 62 mL/min/{1.73_m2} (ref >=60)

## 2022-10-26 LAB — TSH: TSH: 0.54 m[IU]/L (ref 0.40–4.50)

## 2023-01-15 ENCOUNTER — Other Ambulatory Visit: Payer: Self-pay

## 2023-01-15 ENCOUNTER — Ambulatory Visit: Payer: Medicare PPO | Admitting: Nurse Practitioner

## 2023-01-15 ENCOUNTER — Encounter: Payer: Self-pay | Admitting: Nurse Practitioner

## 2023-01-15 VITALS — BP 118/60 | HR 93 | Temp 97.5°F | Ht 60.0 in | Wt 140.2 lb

## 2023-01-15 DIAGNOSIS — Z1152 Encounter for screening for COVID-19: Secondary | ICD-10-CM | POA: Diagnosis not present

## 2023-01-15 DIAGNOSIS — E782 Mixed hyperlipidemia: Secondary | ICD-10-CM | POA: Diagnosis not present

## 2023-01-15 DIAGNOSIS — U071 COVID-19: Secondary | ICD-10-CM | POA: Diagnosis not present

## 2023-01-15 LAB — POC COVID19 BINAXNOW: SARS Coronavirus 2 Ag: POSITIVE — AB

## 2023-01-15 MED ORDER — DEXAMETHASONE 1 MG PO TABS
ORAL_TABLET | ORAL | 0 refills | Status: AC
Start: 2023-01-15 — End: ?

## 2023-01-15 NOTE — Patient Instructions (Signed)
 Covid   Immue support reviewed Vit C, Vit D and Zinc Take tylenol  PRN temp 101+ Push hydration Regular ambulation or calf exercises exercises for clot prevention and 81 mg ASA unless contraindicated Sx supportive therapy suggested Follow up via mychart or telephone if needed Advised patient obtain O2 monitor; present to ED if persistently <90% or with severe dyspnea, CP, fever uncontrolled by tylenol , confusion, sudden decline       Should remain in isolation 5 days from testing positive and then wear a mask when around other people for the following 5 days -     dexamethasone  (DECADRON ) 1 MG tablet; Take 3 tabs for 3 days, 2 tabs for 3 days 1 tab for 5 days. Take with food.

## 2023-01-15 NOTE — Progress Notes (Signed)
 Assessment and Plan:   Covid 19  Immue support reviewed Vit C, Vit D and Zinc Take tylenol  PRN temp 101+ Push hydration Regular ambulation or calf exercises exercises for clot prevention and 81 mg ASA unless contraindicated Sx supportive therapy suggested Follow up via mychart or telephone if needed Advised patient obtain O2 monitor; present to ED if persistently <90% or with severe dyspnea, CP, fever uncontrolled by tylenol , confusion, sudden decline       Should remain in isolation 5 days from testing positive and then wear a mask when around other people for the following 5 days -     dexamethasone  (DECADRON ) 1 MG tablet; Take 3 tabs for 3 days, 2 tabs for 3 days 1 tab for 5 days. Take with food.         Further disposition pending results of labs. Discussed med's effects and SE's.   Over 30 minutes of exam, counseling, chart review, and critical decision making was performed.   Future Appointments  Date Time Provider Department Center  04/26/2023 11:00 AM Tonita Fallow, MD [facility] None  07/30/2023 10:30 AM Jude Lonell BRAVO, NP [facility] None    ------------------------------------------------------------------------------------------------------------------   HPI BP 118/60   Pulse 93   Temp (!) 97.5 F (36.4 C)   Ht 5' (1.524 m)   Wt 140 lb 3.2 oz (63.6 kg)   SpO2 99%   BMI 27.38 kg/m    89 y.o.female presents for runny nose, dry cough, shortness of breath, fatigue , weakness, loss of smell and taste. Just came off a cruise 7 days ago and symptoms began 5 days ago. She has been using Mucinex and warm salt water gargles.   BP well controlled without medication BP Readings from Last 3 Encounters:  01/15/23 118/60  10/25/22 124/62  07/25/22 132/68  Denies headaches, chest pain, shortness of breath and dizziness  BMI is Body mass index is 27.38 kg/m., she has been working on diet and exercise. Wt Readings from Last 3 Encounters:  01/15/23 140 lb 3.2 oz  (63.6 kg)  10/25/22 137 lb 6.4 oz (62.3 kg)  07/25/22 139 lb 6.4 oz (63.2 kg)   Thyroid  is well controlled currently on Levothyroxine  50 mcg daily Lab Results  Component Value Date   TSH 0.54 10/25/2022      Past Medical History:  Diagnosis Date   Breast cancer (HCC) 08/2011   right   Diverticulosis    Hypothyroidism    Labile hypertension    Prediabetes    Thoracic aorta atherosclerosis (HCC)    via CXR   Vitamin D  deficiency      No Known Allergies  Current Outpatient Medications on File Prior to Visit  Medication Sig   aspirin 81 MG tablet Take 81 mg by mouth daily.   CALCIUM PO Take 1 tablet by mouth daily.   cetirizine (ZYRTEC ALLERGY) 10 MG tablet Take 10 mg by mouth daily.   Cholecalciferol (VITAMIN D ) 2000 UNITS tablet Take 2,000 Units by mouth 3 (three) times daily.   fish oil-omega-3 fatty acids 1000 MG capsule Take 2 g by mouth daily.   levothyroxine  (EUTHYROX ) 50 MCG tablet Take  1 tablet  Daily  on an empty stomach with only water for 30 minutes & no Antacid meds, Calcium or Magnesium  for 4 hours & avoid Biotin   magnesium  30 MG tablet Take 30 mg by mouth 2 (two) times daily.   Multiple Vitamin (MULTIVITAMIN) tablet Take 1 tablet by mouth daily.   Red Yeast Rice  600 MG CAPS Take 600 mg by mouth daily.    vitamin C (ASCORBIC ACID) 500 MG tablet Take 500 mg by mouth daily.   No current facility-administered medications on file prior to visit.    ROS: all negative except above.   Physical Exam:  BP 118/60   Pulse 93   Temp (!) 97.5 F (36.4 C)   Ht 5' (1.524 m)   Wt 140 lb 3.2 oz (63.6 kg)   SpO2 99%   BMI 27.38 kg/m   General Appearance: Well nourished, in no apparent distress. Eyes: PERRLA, EOMs, conjunctiva no swelling or erythema Sinuses: No Frontal/maxillary tenderness ENT/Mouth: Ext aud canals clear, TMs without erythema, bulging. No erythema, swelling, or exudate on post pharynx.  Tonsils not swollen or erythematous. Hearing normal.  Neck:  Supple, thyroid  normal.  Respiratory: Respiratory effort normal, BS equal bilaterally without rales, rhonchi, wheezing or stridor.  Cardio: RRR with no MRGs. Brisk peripheral pulses without edema.  Abdomen: Soft, + BS.  Non tender, no guarding, rebound, hernias, masses. Lymphatics: Non tender without lymphadenopathy.  Musculoskeletal: Full ROM, 5/5 strength, normal gait.  Skin: Warm, dry without rashes, lesions, ecchymosis.  Neuro: Cranial nerves intact. Normal muscle tone, no cerebellar symptoms. Sensation intact.  Psych: Awake and oriented X 3, normal affect, Insight and Judgment appropriate.     Robin Huynh E Eliu Batch, NP 12:14 PM [facility]

## 2023-02-05 DIAGNOSIS — H04123 Dry eye syndrome of bilateral lacrimal glands: Secondary | ICD-10-CM | POA: Diagnosis not present

## 2023-03-11 ENCOUNTER — Other Ambulatory Visit: Payer: Self-pay

## 2023-03-11 DIAGNOSIS — E039 Hypothyroidism, unspecified: Secondary | ICD-10-CM

## 2023-03-11 MED ORDER — LEVOTHYROXINE SODIUM 50 MCG PO TABS: ORAL_TABLET | ORAL | 0 refills | Status: AC

## 2023-04-26 ENCOUNTER — Encounter: Payer: Medicare PPO | Admitting: Internal Medicine

## 2023-06-06 ENCOUNTER — Encounter: Payer: Self-pay | Admitting: Family Medicine

## 2023-06-06 ENCOUNTER — Ambulatory Visit: Admitting: Family Medicine

## 2023-06-06 VITALS — BP 150/88 | HR 66 | Temp 96.6°F | Resp 18 | Ht 61.0 in | Wt 141.0 lb

## 2023-06-06 DIAGNOSIS — Z853 Personal history of malignant neoplasm of breast: Secondary | ICD-10-CM | POA: Insufficient documentation

## 2023-06-06 DIAGNOSIS — E782 Mixed hyperlipidemia: Secondary | ICD-10-CM | POA: Diagnosis not present

## 2023-06-06 DIAGNOSIS — E039 Hypothyroidism, unspecified: Secondary | ICD-10-CM | POA: Diagnosis not present

## 2023-06-06 DIAGNOSIS — J302 Other seasonal allergic rhinitis: Secondary | ICD-10-CM | POA: Insufficient documentation

## 2023-06-06 DIAGNOSIS — E559 Vitamin D deficiency, unspecified: Secondary | ICD-10-CM | POA: Diagnosis not present

## 2023-06-06 MED ORDER — LEVOCETIRIZINE DIHYDROCHLORIDE 5 MG PO TABS: 5.0000 mg | ORAL_TABLET | Freq: Every evening | ORAL | 3 refills | Status: AC | PRN

## 2023-06-06 MED ORDER — AZELASTINE HCL 0.1 % NA SOLN: 2.0000 | Freq: Two times a day (BID) | NASAL | 12 refills | Status: AC | PRN

## 2023-06-06 MED ORDER — FLUTICASONE PROPIONATE 50 MCG/ACT NA SUSP: 2.0000 | Freq: Every day | NASAL | 6 refills | Status: AC | PRN

## 2023-06-06 NOTE — Patient Instructions (Addendum)
  VISIT SUMMARY: Today, we discussed your seasonal allergies, which are causing you symptoms like cough, congestion, and sneezing, especially in the mornings. We also reviewed your history of breast cancer, hypothyroidism, vitamin D  deficiency, and mildly elevated cholesterol.  YOUR PLAN: -SEASONAL ALLERGIES: Seasonal allergies occur when your immune system reacts to allergens like pollen, causing symptoms such as nasal congestion, post-nasal drip, and cough. We recommend continuing your current allergy medication and adding azelastine nasal spray, which is now available over-the-counter, for additional relief.  -HYPOTHYROIDISM: Hypothyroidism is a condition where your thyroid  gland doesn't produce enough thyroid  hormone. You are managing this with 50 micrograms of levothyroxine  daily, and no changes are needed at this time.  -ELEVATED CHOLESTEROL: Mildly elevated cholesterol means your cholesterol levels are slightly higher than normal. You are managing this with a healthy diet and red yeast rice, and no prescription medication is needed at this time.  -VITAMIN D  DEFICIENCY: Vitamin D  deficiency means you have lower than normal levels of vitamin D . You are managing this with supplements, and there are no current issues.  -HISTORY OF BREAST CANCER: You have a history of breast cancer and underwent a right mastectomy several years ago. You have been released from oncology follow-up, indicating no current active disease.  INSTRUCTIONS: Please start using the azelastine nasal spray as discussed for your seasonal allergies. Continue with your current medications and supplements for hypothyroidism, cholesterol, and vitamin D  deficiency. No follow-up is needed for your history of breast cancer at this time.

## 2023-06-06 NOTE — Progress Notes (Signed)
 Assessment & Plan   Assessment/Plan:    Assessment & Plan Seasonal Allergies Seasonal allergies with nasal congestion, post-nasal drip, and cough, exacerbated in the morning and alleviated by allergy medication. No wheezing or significant dyspnea. Likely due to environmental allergens, possibly related to her affinity for flowers. Discussed azelastine nasal spray, now available OTC, as an additional treatment option. - Prescribe azelastine nasal spray for additional relief from nasal congestion and post-nasal drip. - Continue current allergy medication regimen.  Hypothyroidism Hypothyroidism managed with levothyroxine  50 micrograms daily.  Mildly Elevated Cholesterol Mildly elevated cholesterol managed with dietary measures and red yeast rice. No prescription medication required at this time.  Vitamin D  Deficiency Vitamin D  deficiency managed with supplements. No current issues reported.  History of Breast Cancer History of right breast cancer with mastectomy several years ago. Released from oncology follow-up, indicating no current active disease.      There are no discontinued medications.  Return in about 3 months (around 09/06/2023) for fasting labs.        Subjective:   Encounter date: 06/06/2023  Robin Huynh is a 89 y.o. female who has Primary cancer of lower outer quadrant of right female breast (HCC); Labile hypertension (2000); Hypothyroidism; Other abnormal glucose (prediabetes); Vitamin D  deficiency; Diverticulosis; Medication management; Hyperlipidemia; Encounter for Medicare annual wellness exam; Obesity (BMI 30.0-34.9); Aortic atherosclerosis by CXR 04/17/2013; Chronic seasonal allergic rhinitis; and History of breast cancer on their problem list..   She  has a past medical history of Breast cancer (HCC) (08/2011), Diverticulosis, Hypothyroidism, Labile hypertension, Prediabetes, Thoracic aorta atherosclerosis (HCC), and Vitamin D  deficiency..   She  presents with chief complaint of Establish Care (Pt c/o of seasonal allergies. Pt using zyrtec and clartin//HM due- Shingles vaccine ) .   Discussed the use of AI scribe software for clinical note transcription with the patient, who gave verbal consent to proceed.  History of Present Illness Robin Huynh is an 89 year old female who presents with seasonal allergies.  She experiences seasonal allergies characterized by cough, congestion, and sneezing. Her voice is hoarse, and she has chest congestion and post-nasal drip. Symptoms are more pronounced in the mornings but improve after taking an allergy pill and using a nasal spray.  She has a history of breast cancer, having undergone a right mastectomy several years ago, and has been released from oncology follow-up. She also has hypothyroidism, managed with 50 micrograms of medication daily. She has a history of vitamin D  deficiency, for which she takes supplements, and diverticulitis. She also has mildly elevated cholesterol, managed with red yeast rice and a healthy diet.  Her blood pressure is usually normal but can elevate with anxiety. No consistent high blood pressure. She has a history of dry eyes and has had cataract surgery, for which she sees an eye doctor.  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 She enjoys cooking, sewing, and gardening.      Past Surgical History:  Procedure Laterality Date   BREAST SURGERY     right breast   COLONOSCOPY     MASTECTOMY COMPLETE / SIMPLE W/ SENTINEL NODE BIOPSY  08/10/11   right   MASTECTOMY W/ SENTINEL NODE BIOPSY  08/10/2011   Procedure: MASTECTOMY WITH SENTINEL LYMPH NODE BIOPSY;  Surgeon: Rogena Class, MD;  Location: [facility] OR;  Service: General;  Laterality: Right;    Outpatient Medications Prior to Visit  Medication Sig Dispense Refill  aspirin 81 MG tablet Take 81 mg by mouth daily.     CALCIUM PO Take 1  tablet by mouth daily.     cetirizine (ZYRTEC ALLERGY) 10 MG tablet Take 10 mg by mouth daily.     Cholecalciferol (VITAMIN D ) 2000 UNITS tablet Take 2,000 Units by mouth 3 (three) times daily.     dexamethasone  (DECADRON ) 1 MG tablet Take 3 tabs for 3 days, 2 tabs for 3 days 1 tab for 5 days. Take with food. 20 tablet 0   fish oil-omega-3 fatty acids 1000 MG capsule Take 2 g by mouth daily.     levothyroxine  (EUTHYROX ) 50 MCG tablet Take  1 tablet  Daily  on an empty stomach with only water for 30 minutes & no Antacid meds, Calcium or Magnesium  for 4 hours & avoid Biotin 90 tablet 0   magnesium  30 MG tablet Take 30 mg by mouth 2 (two) times daily.     Multiple Vitamin (MULTIVITAMIN) tablet Take 1 tablet by mouth daily.     Red Yeast Rice 600 MG CAPS Take 600 mg by mouth daily.      vitamin C (ASCORBIC ACID) 500 MG tablet Take 500 mg by mouth daily.     No facility-administered medications prior to visit.    Family History  Problem Relation Age of Onset   Colon cancer Mother 72   Prostate cancer Father    Lung cancer Brother    Lung cancer Sister    Kidney cancer Brother     Social History   Socioeconomic History   Marital status: Married    Spouse name: Not on file   Number of children: Not on file   Years of education: Not on file   Highest education level: Not on file  Occupational History   Not on file  Tobacco Use   Smoking status: Never   Smokeless tobacco: Never  Substance and Sexual Activity   Alcohol use: No   Drug use: No   Sexual activity: Yes  Other Topics Concern   Not on file  Social History Narrative   Not on file   Social Drivers of Health   Financial Resource Strain: Not on file  Food Insecurity: Not on file  Transportation Needs: Not on file  Physical Activity: Not on file  Stress: Not on file  Social Connections: Not on file  Intimate Partner Violence: Not on file                                                                                                   Objective:  Physical Exam: BP (!) 150/88 (BP Location: Left Arm, Patient Position: Sitting, Cuff Size: Large) Comment: recheck  Pulse 66   Temp (!) 96.6 F (35.9 C) (Temporal)   Resp 18   Ht 5\' 1"  (1.549 m)   Wt 141 lb (64 kg)   SpO2 100%   BMI 26.64 kg/m    Physical Exam GENERAL: Alert, cooperative, well developed, no acute distress. HEENT: Normocephalic, normal oropharynx, moist mucous membranes. CHEST: Clear to auscultation bilaterally, no wheezes, rhonchi, or  crackles. CARDIOVASCULAR: Normal heart rate and rhythm, S1 and S2 normal without murmurs. ABDOMEN: Soft, non-tender, non-distended, without organomegaly, normal bowel sounds. EXTREMITIES: No cyanosis or edema. NEUROLOGICAL: Cranial nerves grossly intact, moves all extremities without gross motor or sensory deficit.     No results found.  No results found for this or any previous visit (from the past 2160 hours).      Carnell Christian, MD, MS

## 2023-07-30 ENCOUNTER — Ambulatory Visit: Payer: Medicare PPO | Admitting: Nurse Practitioner

## 2023-09-11 ENCOUNTER — Encounter: Payer: Self-pay | Admitting: Family Medicine

## 2023-09-11 ENCOUNTER — Ambulatory Visit: Admitting: Family Medicine

## 2023-09-11 VITALS — BP 138/80 | HR 60 | Temp 97.0°F | Resp 18 | Wt 142.2 lb

## 2023-09-11 DIAGNOSIS — I700 Atherosclerosis of aorta: Secondary | ICD-10-CM

## 2023-09-11 DIAGNOSIS — E782 Mixed hyperlipidemia: Secondary | ICD-10-CM | POA: Diagnosis not present

## 2023-09-11 DIAGNOSIS — E039 Hypothyroidism, unspecified: Secondary | ICD-10-CM | POA: Diagnosis not present

## 2023-09-11 DIAGNOSIS — R0989 Other specified symptoms and signs involving the circulatory and respiratory systems: Secondary | ICD-10-CM | POA: Diagnosis not present

## 2023-09-11 DIAGNOSIS — R7309 Other abnormal glucose: Secondary | ICD-10-CM

## 2023-09-11 DIAGNOSIS — Z79899 Other long term (current) drug therapy: Secondary | ICD-10-CM | POA: Diagnosis not present

## 2023-09-11 DIAGNOSIS — M79661 Pain in right lower leg: Secondary | ICD-10-CM

## 2023-09-11 DIAGNOSIS — Z23 Encounter for immunization: Secondary | ICD-10-CM

## 2023-09-11 DIAGNOSIS — E559 Vitamin D deficiency, unspecified: Secondary | ICD-10-CM | POA: Diagnosis not present

## 2023-09-11 LAB — CBC WITH DIFFERENTIAL/PLATELET
Basophils Absolute: 0 K/uL (ref 0.0–0.1)
Basophils Relative: 0.4 % (ref 0.0–3.0)
Eosinophils Absolute: 0.1 K/uL (ref 0.0–0.7)
Eosinophils Relative: 1.9 % (ref 0.0–5.0)
HCT: 40.8 % (ref 36.0–46.0)
Hemoglobin: 13.1 g/dL (ref 12.0–15.0)
Lymphocytes Relative: 35.1 % (ref 12.0–46.0)
Lymphs Abs: 1.9 K/uL (ref 0.7–4.0)
MCHC: 32.1 g/dL (ref 30.0–36.0)
MCV: 85.9 fl (ref 78.0–100.0)
Monocytes Absolute: 0.6 K/uL (ref 0.1–1.0)
Monocytes Relative: 11.7 % (ref 3.0–12.0)
Neutro Abs: 2.8 K/uL (ref 1.4–7.7)
Neutrophils Relative %: 50.9 % (ref 43.0–77.0)
Platelets: 147 K/uL — ABNORMAL LOW (ref 150.0–400.0)
RBC: 4.75 Mil/uL (ref 3.87–5.11)
RDW: 14.3 % (ref 11.5–15.5)
WBC: 5.4 K/uL (ref 4.0–10.5)

## 2023-09-11 LAB — HEMOGLOBIN A1C: Hgb A1c MFr Bld: 6.7 % — ABNORMAL HIGH (ref 4.6–6.5)

## 2023-09-11 LAB — VITAMIN D 25 HYDROXY (VIT D DEFICIENCY, FRACTURES): VITD: 80.93 ng/mL (ref 30.00–100.00)

## 2023-09-11 LAB — LIPID PANEL
Cholesterol: 207 mg/dL — ABNORMAL HIGH (ref 0–200)
HDL: 82.1 mg/dL (ref >39.00)
LDL Cholesterol: 112 mg/dL — ABNORMAL HIGH (ref 0–99)
NonHDL: 124.92
Total CHOL/HDL Ratio: 3
Triglycerides: 67 mg/dL (ref 0.0–149.0)
VLDL: 13.4 mg/dL (ref 0.0–40.0)

## 2023-09-11 LAB — MICROALBUMIN / CREATININE URINE RATIO
Creatinine,U: 51.4 mg/dL
Microalb Creat Ratio: 33.6 mg/g — ABNORMAL HIGH (ref 0.0–30.0)
Microalb, Ur: 1.7 mg/dL (ref 0.0–1.9)

## 2023-09-11 LAB — TSH: TSH: 1.54 u[IU]/mL (ref 0.35–5.50)

## 2023-09-11 LAB — MAGNESIUM: Magnesium: 2 mg/dL (ref 1.5–2.5)

## 2023-09-11 NOTE — Progress Notes (Signed)
 Assessment & Plan   Assessment/Plan:     Assessment and Plan Assessment & Plan Essential hypertension Blood pressure is well-controlled at 138/80 mmHg with magnesium  supplements. - Continue magnesium  supplements for blood pressure management. - Recommend lifestyle modifications including low carb diet, exercise, and low fat diet.  Prediabetes Prediabetes. - Recommend lifestyle modifications including low carb diet, exercise, and low fat diet. - Order hemoglobin A1c.  Mixed hyperlipidemia (statin intolerant) Managed with red yeast rice and fish oil omega-3 due to statin intolerance. Lipid levels mildly elevated. - Continue red yeast rice and fish oil omega-3. - Order lipid panel.  Hypothyroidism Well-managed on levothyroxine  50 mcg daily. TSH requires rechecking. - Continue levothyroxine  50 mcg daily. - Order TSH.  Vitamin D  deficiency Vitamin D  deficiency. - Order vitamin D  level.  Atherosclerosis of aorta Aortic atherosclerosis managed with aspirin 81 mg for primary cardiovascular prevention. - Continue aspirin 81 mg for primary cardiovascular prevention.  Seasonal allergic rhinitis Managed with cetirizine 10 mg QHS and azelastine  nasal spray. Symptoms have eased recently. No shortness of breath or wheezing. - Continue cetirizine 10 mg QHS. - Continue and refill azelastine  nasal spray.  Leg muscle soreness Mild soreness in the right calf after walking, no pain reported. No history of asthma or significant shortness of breath during exercise. - Recommend light stretching and massage before and during walks. - Consider using diclofenac if needed.  General Health Maintenance Discussed lifestyle modifications for management of prediabetes, hypertension, and hyperlipidemia. - Recommend lifestyle modifications including low carb diet, exercise, and low fat diet.  Follow-Up Lab work can be done today despite not fasting, as it may not significantly affect results. -  Order metabolic panel, CBC, microalbumin, and creatinine ratio. - Perform lab work today if convenient, otherwise schedule a fasting lab appointment.      Medications Discontinued During This Encounter  Medication Reason   cetirizine (ZYRTEC ALLERGY) 10 MG tablet     Return in about 6 months (around 03/10/2024) for HLD, BP.        Subjective:   Encounter date: 09/11/2023  Robin Huynh is a 89 y.o. female who has Primary cancer of lower outer quadrant of right female breast (HCC); Labile hypertension (2000); Hypothyroidism; Abnormal glucose; Vitamin D  deficiency; Diverticulosis; Medication management; Hyperlipidemia, mixed; Encounter for Medicare annual wellness exam; Obesity (BMI 30.0-34.9); Aortic atherosclerosis by CXR 04/17/2013; Chronic seasonal allergic rhinitis; History of breast cancer; and Right calf pain on their problem list..   She  has a past medical history of Breast cancer (HCC) (08/2011), Diverticulosis, Hypothyroidism, Labile hypertension, Prediabetes, Thoracic aorta atherosclerosis (HCC), and Vitamin D  deficiency..   She presents with chief complaint of Medical Management of Chronic Issues (3 month follow up. Pt is not fasting today. //HM due- vaccinations; mammogram scheduled for 09/11/2023) .   Discussed the use of AI scribe software for clinical note transcription with the patient, who gave verbal consent to proceed.  History of Present Illness Robin Huynh is an 89 year old female who presents for a three-month follow-up visit. She is accompanied by OJ, her family member.  She has a history of hypertension, currently controlled with a blood pressure reading of 138/80 mmHg. She takes magnesium  supplements.  She has hyperlipidemia and is statin intolerant, managing her condition with red yeast rice and fish oil omega-3 supplements. Her last lipid panel showed mildly elevated levels.  She has hypothyroidism and is on levothyroxine  50 mcg daily. Her last TSH  was stable, but it needs rechecking.  She has a history of vitamin D  deficiency.  She has prediabetes.  She experiences seasonal allergies and takes cetirizine 10 mg at bedtime and azelastine  nasal spray twice daily. Her allergies have eased slightly over the past two weeks, but she still experiences congestion in the mornings, which improves after using her nasal spray. No significant shortness of breath or wheezing once the congestion clears.  She walks for about 30 minutes most mornings and reports some soreness in her right calf after walking, which improves with massage and stretching. She does not experience significant pain or limitation in her activities.  She takes aspirin 81 mg for primary cardiovascular prevention due to a history of aortic atherosclerosis found on a chest x-ray in April 2015.     ROS  Past Surgical History:  Procedure Laterality Date   BREAST SURGERY     right breast   COLONOSCOPY     MASTECTOMY COMPLETE / SIMPLE W/ SENTINEL NODE BIOPSY  08/10/11   right   MASTECTOMY W/ SENTINEL NODE BIOPSY  08/10/2011   Procedure: MASTECTOMY WITH SENTINEL LYMPH NODE BIOPSY;  Surgeon: Vicenta DELENA Poli, MD;  Location: [facility] OR;  Service: General;  Laterality: Right;    Outpatient Medications Prior to Visit  Medication Sig Dispense Refill   aspirin 81 MG tablet Take 81 mg by mouth daily.     azelastine  (ASTELIN ) 0.1 % nasal spray Place 2 sprays into both nostrils 2 (two) times daily as needed for rhinitis or allergies. 30 mL 12   CALCIUM PO Take 1 tablet by mouth daily.     Cholecalciferol (VITAMIN D ) 2000 UNITS tablet Take 2,000 Units by mouth 3 (three) times daily.     dexamethasone  (DECADRON ) 1 MG tablet Take 3 tabs for 3 days, 2 tabs for 3 days 1 tab for 5 days. Take with food. 20 tablet 0   fish oil-omega-3 fatty acids 1000 MG capsule Take 2 g by mouth daily.     fluticasone  (FLONASE ) 50 MCG/ACT nasal spray Place 2 sprays into both nostrils daily as needed for allergies  or rhinitis. 16 g 6   levocetirizine (XYZAL ) 5 MG tablet Take 1 tablet (5 mg total) by mouth at bedtime as needed for allergies. 90 tablet 3   levothyroxine  (EUTHYROX ) 50 MCG tablet Take  1 tablet  Daily  on an empty stomach with only water for 30 minutes & no Antacid meds, Calcium or Magnesium  for 4 hours & avoid Biotin 90 tablet 0   magnesium  30 MG tablet Take 30 mg by mouth 2 (two) times daily.     Multiple Vitamin (MULTIVITAMIN) tablet Take 1 tablet by mouth daily.     Red Yeast Rice 600 MG CAPS Take 600 mg by mouth daily.      vitamin C (ASCORBIC ACID) 500 MG tablet Take 500 mg by mouth daily.     cetirizine (ZYRTEC ALLERGY) 10 MG tablet Take 10 mg by mouth daily.     No facility-administered medications prior to visit.    Family History  Problem Relation Age of Onset   Colon cancer Mother 43   Prostate cancer Father    Lung cancer Brother    Lung cancer Sister    Kidney cancer Brother     Social History   Socioeconomic History   Marital status: Married    Spouse name: Not on file   Number of children: Not on file   Years of education: Not on file   Highest education level: Not on file  Occupational History   Not on file  Tobacco Use   Smoking status: Never   Smokeless tobacco: Never  Vaping Use   Vaping status: Never Used  Substance and Sexual Activity   Alcohol use: No   Drug use: No   Sexual activity: Yes  Other Topics Concern   Not on file  Social History Narrative   Not on file   Social Drivers of Health   Financial Resource Strain: Not on file  Food Insecurity: Not on file  Transportation Needs: Not on file  Physical Activity: Not on file  Stress: Not on file  Social Connections: Not on file  Intimate Partner Violence: Not on file                                                                                                  Objective:  Physical Exam: BP 138/80 (BP Location: Left Arm, Patient Position: Sitting, Cuff Size: Normal) Comment:  recheck after resting  Pulse 60   Temp (!) 97 F (36.1 C) (Temporal)   Resp 18   Wt 142 lb 3.2 oz (64.5 kg)   SpO2 100%   BMI 26.87 kg/m    Physical Exam VITALS: BP- 138/80 GENERAL: Alert, cooperative, well developed, no acute distress HEENT: Normocephalic, normal oropharynx, moist mucous membranes CHEST: Clear to auscultation bilaterally, no wheezes, rhonchi, or crackles CARDIOVASCULAR: Normal heart rate and rhythm, S1 and S2 normal without murmurs ABDOMEN: Soft, non-tender, non-distended, without organomegaly, normal bowel sounds EXTREMITIES: No tenderness of right calf or knee, no swelling, normal range of motion of knee, cyanosis, or edema NEUROLOGICAL: Cranial nerves grossly intact, moves all extremities without gross motor or sensory deficit   Physical Exam  No results found.  No results found for this or any previous visit (from the past 2160 hours).      Beverley Adine Hummer, MD, MS

## 2023-09-11 NOTE — Patient Instructions (Addendum)
  VISIT SUMMARY: Today, you had a follow-up visit to review your ongoing health conditions. Your blood pressure is well-controlled, and we discussed your management plan for prediabetes, hyperlipidemia, hypothyroidism, vitamin D  deficiency, aortic atherosclerosis, seasonal allergies, and leg muscle soreness. We also reviewed your current medications and supplements.  YOUR PLAN: -ESSENTIAL HYPERTENSION: Your blood pressure is well-controlled at 138/80 mmHg with the help of magnesium  supplements. Continue taking your magnesium  supplements and follow lifestyle modifications including a low carb diet, regular exercise, and a low fat diet.  -PREDIABETES: Prediabetes means your blood sugar levels are higher than normal but not high enough to be classified as diabetes. Continue with lifestyle modifications including a low carb diet, regular exercise, and a low fat diet. We will also check your hemoglobin A1c to monitor your blood sugar levels.  -MIXED HYPERLIPIDEMIA (STATIN INTOLERANT): Mixed hyperlipidemia means you have elevated levels of different types of fats in your blood. Since you are intolerant to statins, continue managing this with red yeast rice and fish oil omega-3 supplements. We will also order a lipid panel to check your cholesterol levels.  -HYPOTHYROIDISM: Hypothyroidism means your thyroid  gland is underactive. Continue taking levothyroxine  50 mcg daily. We will recheck your TSH levels to ensure they are stable.  -VITAMIN D  DEFICIENCY: Vitamin D  deficiency means you have lower than normal levels of vitamin D . We will order a vitamin D  level test to monitor this.  -ATHEROSCLEROSIS OF AORTA: Atherosclerosis of the aorta means there is a buildup of plaque in the main artery of your body. Continue taking aspirin 81 mg daily for primary cardiovascular prevention.  -SEASONAL ALLERGIC RHINITIS: Seasonal allergic rhinitis means you have allergy symptoms that occur at certain times of the year.  Continue taking cetirizine 10 mg at bedtime and using azelastine  nasal spray twice daily. Your symptoms have eased recently, but continue with your current regimen.  -RIGHT CALF MUSCLE SORENESS: You experience mild soreness in your right calf after walking. This is likely due to muscle strain. I recommend light stretching and massage before and during your walks. You can also consider using diclofenac if needed.  INSTRUCTIONS: Please complete the lab work today if convenient, otherwise schedule a fasting lab appointment. The tests ordered include a metabolic panel, CBC, microalbumin, and creatinine ratio.                      Contains text generated by [employer].                                 Contains text generated by [employer]bridge.

## 2023-09-12 DIAGNOSIS — Z1231 Encounter for screening mammogram for malignant neoplasm of breast: Secondary | ICD-10-CM | POA: Diagnosis not present

## 2023-09-12 LAB — COMPLETE METABOLIC PANEL WITHOUT GFR
AG Ratio: 1.1 (calc) (ref 1.0–2.5)
ALT: 7 U/L (ref 6–29)
AST: 25 U/L (ref 10–35)
Albumin: 4.1 g/dL (ref 3.6–5.1)
Alkaline phosphatase (APISO): 76 U/L (ref 37–153)
BUN: 21 mg/dL (ref 7–25)
CO2: 25 mmol/L (ref 20–32)
Calcium: 9.6 mg/dL (ref 8.6–10.4)
Chloride: 103 mmol/L (ref 98–110)
Creat: 0.75 mg/dL (ref 0.60–0.95)
Globulin: 3.7 g/dL (ref 1.9–3.7)
Glucose, Bld: 96 mg/dL (ref 65–99)
Potassium: 4.1 mmol/L (ref 3.5–5.3)
Sodium: 139 mmol/L (ref 135–146)
Total Bilirubin: 0.3 mg/dL (ref 0.2–1.2)
Total Protein: 7.8 g/dL (ref 6.1–8.1)

## 2023-09-12 LAB — HM MAMMOGRAPHY

## 2023-09-12 LAB — INSULIN, RANDOM: Insulin: 5.1 u[IU]/mL

## 2023-09-16 ENCOUNTER — Encounter: Payer: Self-pay | Admitting: Family Medicine

## 2023-09-18 ENCOUNTER — Ambulatory Visit: Payer: Self-pay | Admitting: Family Medicine

## 2023-09-18 DIAGNOSIS — E1129 Type 2 diabetes mellitus with other diabetic kidney complication: Secondary | ICD-10-CM | POA: Insufficient documentation

## 2023-11-08 ENCOUNTER — Ambulatory Visit

## 2023-12-20 ENCOUNTER — Other Ambulatory Visit: Payer: Self-pay | Admitting: Nurse Practitioner

## 2023-12-20 DIAGNOSIS — E039 Hypothyroidism, unspecified: Secondary | ICD-10-CM

## 2024-01-17 ENCOUNTER — Ambulatory Visit

## 2024-01-17 VITALS — BP 136/60 | HR 68 | Temp 97.4°F | Ht 60.0 in | Wt 141.2 lb

## 2024-01-17 DIAGNOSIS — Z0000 Encounter for general adult medical examination without abnormal findings: Secondary | ICD-10-CM | POA: Diagnosis not present

## 2024-01-17 NOTE — Patient Instructions (Signed)
 [name],  Thank you for taking the time for your Medicare Wellness Visit. I appreciate your continued commitment to your health goals. Please review the care plan we discussed, and feel free to reach out if I can assist you further.  Please note that Annual Wellness Visits do not include a physical exam. Some assessments may be limited, especially if the visit was conducted virtually. If needed, we may recommend an in-person follow-up with your provider.  Ongoing Care Seeing your primary care provider every 3 to 6 months helps us  monitor your health and provide consistent, personalized care.   Referrals If a referral was made during today's visit and you haven't received any updates within two weeks, please contact the referred provider directly to check on the status.  Recommended Screenings:  Health Maintenance  Topic Date Due   Complete foot exam   Never done   Medicare Annual Wellness Visit  07/25/2023   Zoster (Shingles) Vaccine (2 of 2) 11/06/2023   Eye exam for diabetics  02/05/2024   Hemoglobin A1C  03/10/2024   COVID-19 Vaccine (7 - Pfizer risk 2025-26 season) 05/19/2024   Breast Cancer Screening  09/11/2024   Pneumococcal Vaccine for age over 71  Completed   Flu Shot  Completed   Osteoporosis screening with Bone Density Scan  Completed   Meningitis B Vaccine  Aged Out   DTaP/Tdap/Td vaccine  Discontinued       01/17/2024    3:58 PM  Advanced Directives  Does Patient Have a Medical Advance Directive? Yes  Type of Estate Agent of [place];Living will  Does patient want to make changes to medical advance directive? No - Patient declined  Copy of Healthcare Power of Attorney in Chart? No - copy requested    Vision: Annual vision screenings are recommended for early detection of glaucoma, cataracts, and diabetic retinopathy. These exams can also reveal signs of chronic conditions such as diabetes and high blood pressure.  Dental: Annual dental  screenings help detect early signs of oral cancer, gum disease, and other conditions linked to overall health, including heart disease and diabetes.  Please see the attached documents for additional preventive care recommendations.

## 2024-01-17 NOTE — Progress Notes (Signed)
 "  Chief Complaint  Patient presents with   Medicare Wellness     Subjective:   Robin Huynh is a 89 y.o. female who presents for a Medicare Annual Wellness Visit.  Visit info / Clinical Intake: Medicare Wellness Visit Type:: Subsequent Annual Wellness Visit Persons participating in visit and providing information:: patient Medicare Wellness Visit Mode:: In-person (required for WTM) Interpreter Needed?: No Pre-visit prep was completed: yes AWV questionnaire completed by patient prior to visit?: no Living arrangements:: lives with spouse/significant other Patient's Overall Health Status Rating: very good Typical amount of pain: none Does pain affect daily life?: no Are you currently prescribed opioids?: no  Dietary Habits and Nutritional Risks How many meals a day?: 2 Eats fruit and vegetables daily?: yes Most meals are obtained by: preparing own meals In the last 2 weeks, have you had any of the following?: none Diabetic:: (!) yes Any non-healing wounds?: no How often do you check your BS?: 0 Would you like to be referred to a Nutritionist or for Diabetic Management? : no  Functional Status Activities of Daily Living (to include ambulation/medication): Independent Ambulation: Independent Medication Administration: Independent Home Management (perform basic housework or laundry): Independent Manage your own finances?: yes Primary transportation is: driving Concerns about vision?: no *vision screening is required for WTM* Concerns about hearing?: no  Fall Screening Falls in the past year?: 0 Number of falls in past year: 0 Was there an injury with Fall?: 0 Fall Risk Category Calculator: 0 Patient Fall Risk Level: Low Fall Risk  Fall Risk Patient at Risk for Falls Due to: No Fall Risks Fall risk Follow up: Falls evaluation completed; Falls prevention discussed  Home and Transportation Safety: All rugs have non-skid backing?: yes All stairs or steps have  railings?: N/A, no stairs Grab bars in the bathtub or shower?: (!) no Have non-skid surface in bathtub or shower?: yes Good home lighting?: yes Regular seat belt use?: yes Hospital stays in the last year:: no  Cognitive Assessment Difficulty concentrating, remembering, or making decisions? : no Will 6CIT or Mini Cog be Completed: yes What year is it?: 0 points What month is it?: 0 points Give patient an address phrase to remember (5 components): [address] [place] About what time is it?: 0 points Count backwards from 20 to 1: 0 points Say the months of the year in reverse: 0 points Repeat the address phrase from earlier: 2 points 6 CIT Score: 2 points  Advance Directives (For Healthcare) Does Patient Have a Medical Advance Directive?: Yes Does patient want to make changes to medical advance directive?: No - Patient declined Type of Advance Directive: Healthcare Power of [place]; Living will Copy of Healthcare Power of Attorney in Chart?: No - copy requested Copy of Living Will in Chart?: No - copy requested  Reviewed/Updated  Reviewed/Updated: Reviewed All (Medical, Surgical, Family, Medications, Allergies, Care Teams, Patient Goals)    Allergies (verified) Patient has no known allergies.   Current Medications (verified) Outpatient Encounter Medications as of 01/17/2024  Medication Sig   aspirin 81 MG tablet Take 81 mg by mouth daily.   azelastine  (ASTELIN ) 0.1 % nasal spray Place 2 sprays into both nostrils 2 (two) times daily as needed for rhinitis or allergies.   CALCIUM PO Take 1 tablet by mouth daily.   Cholecalciferol (VITAMIN D ) 2000 UNITS tablet Take 2,000 Units by mouth 3 (three) times daily.   fish oil-omega-3 fatty acids 1000 MG capsule Take 2 g by mouth daily.  fluticasone  (FLONASE ) 50 MCG/ACT nasal spray Place 2 sprays into both nostrils daily as needed for allergies or rhinitis.   levocetirizine (XYZAL ) 5 MG tablet Take 1 tablet (5 mg total) by mouth  at bedtime as needed for allergies.   levothyroxine  (EUTHYROX ) 50 MCG tablet Take  1 tablet  Daily  on an empty stomach with only water for 30 minutes & no Antacid meds, Calcium or Magnesium  for 4 hours & avoid Biotin   loratadine (CLARITIN) 10 MG tablet Take 10 mg by mouth daily.   magnesium  30 MG tablet Take 30 mg by mouth 2 (two) times daily.   Multiple Vitamin (MULTIVITAMIN) tablet Take 1 tablet by mouth daily.   Red Yeast Rice 600 MG CAPS Take 600 mg by mouth daily.    vitamin B-12 (CYANOCOBALAMIN) 50 MCG tablet Take 50 mcg by mouth daily.   vitamin C (ASCORBIC ACID) 500 MG tablet Take 500 mg by mouth daily.   dexamethasone  (DECADRON ) 1 MG tablet Take 3 tabs for 3 days, 2 tabs for 3 days 1 tab for 5 days. Take with food. (Patient not taking: Reported on 01/17/2024)   No facility-administered encounter medications on file as of 01/17/2024.    History: Past Medical History:  Diagnosis Date   Breast cancer (HCC) 08/2011   right   Diverticulosis    Hypothyroidism    Labile hypertension    Prediabetes    Thoracic aorta atherosclerosis    via CXR   Vitamin D  deficiency    Past Surgical History:  Procedure Laterality Date   BREAST SURGERY     right breast   COLONOSCOPY     MASTECTOMY COMPLETE / SIMPLE W/ SENTINEL NODE BIOPSY  08/10/11   right   MASTECTOMY W/ SENTINEL NODE BIOPSY  08/10/2011   Procedure: MASTECTOMY WITH SENTINEL LYMPH NODE BIOPSY;  Surgeon: Vicenta DELENA Poli, MD;  Location: [facility] OR;  Service: General;  Laterality: Right;   Family History  Problem Relation Age of Onset   Colon cancer Mother 48   Prostate cancer Father    Lung cancer Brother    Lung cancer Sister    Kidney cancer Brother    Social History   Occupational History   Not on file  Tobacco Use   Smoking status: Never   Smokeless tobacco: Never  Vaping Use   Vaping status: Never Used  Substance and Sexual Activity   Alcohol use: No   Drug use: No   Sexual activity: Yes   Tobacco  Counseling Counseling given: Not Answered  SDOH Screenings   Food Insecurity: No Food Insecurity (01/17/2024)  Housing: Unknown (01/17/2024)  Transportation Needs: No Transportation Needs (01/17/2024)  Utilities: Not At Risk (01/17/2024)  Alcohol Screen: Low Risk (01/17/2024)  Depression (PHQ2-9): Low Risk (01/17/2024)  Financial Resource Strain: Low Risk (01/17/2024)  Physical Activity: Sufficiently Active (01/17/2024)  Social Connections: Socially Integrated (01/17/2024)  Stress: No Stress Concern Present (01/17/2024)  Tobacco Use: Low Risk (01/17/2024)  Health Literacy: Adequate Health Literacy (01/17/2024)   See flowsheets for full screening details  Depression Screen PHQ 2 & 9 Depression Scale- Over the past 2 weeks, how often have you been bothered by any of the following problems? Little interest or pleasure in doing things: 0 Feeling down, depressed, or hopeless (PHQ Adolescent also includes...irritable): 0 PHQ-2 Total Score: 0 Trouble falling or staying asleep, or sleeping too much: 0 Feeling tired or having little energy: 0 Poor appetite or overeating (PHQ Adolescent also includes...weight loss): 0 Feeling bad about yourself -  or that you are a failure or have let yourself or your family down: 0 Trouble concentrating on things, such as reading the newspaper or watching television (PHQ Adolescent also includes...like school work): 0 Moving or speaking so slowly that other people could have noticed. Or the opposite - being so fidgety or restless that you have been moving around a lot more than usual: 0 Thoughts that you would be better off dead, or of hurting yourself in some way: 0 PHQ-9 Total Score: 0 If you checked off any problems, how difficult have these problems made it for you to do your work, take care of things at home, or get along with other people?: Not difficult at all  Depression Treatment Depression Interventions/Treatment : EYV7-0 Score <4 Follow-up Not Indicated     Goals  Addressed             This Visit's Progress    Patient Stated       01/17/2024, drink enough water             Objective:    Today's Vitals   01/17/24 1547 01/17/24 1633  BP: (!) 142/60 136/60  Pulse: 68   Temp: (!) 97.4 F (36.3 C)   TempSrc: Oral   Weight: 141 lb 3.2 oz (64 kg)   Height: 5' (1.524 m)    Body mass index is 27.58 kg/m.  Hearing/Vision screen Hearing Screening - Comments:: Denies hearing issues Vision Screening - Comments:: Regular eye exams Immunizations and Health Maintenance Health Maintenance  Topic Date Due   FOOT EXAM  Never done   Zoster Vaccines- Shingrix  (2 of 2) 11/06/2023   OPHTHALMOLOGY EXAM  02/05/2024   HEMOGLOBIN A1C  03/10/2024   COVID-19 Vaccine (7 - Pfizer risk 2025-26 season) 05/19/2024   Mammogram  09/11/2024   Medicare Annual Wellness (AWV)  01/16/2025   Pneumococcal Vaccine: 50+ Years  Completed   Influenza Vaccine  Completed   Bone Density Scan  Completed   Meningococcal B Vaccine  Aged Out   DTaP/Tdap/Td  Discontinued        Assessment/Plan:  This is a routine wellness examination for Adasha.  Patient Care Team: Sebastian Beverley NOVAK, MD as PCP - General (Family Medicine) [facility]  I have personally reviewed and noted the following in the patients chart:   Medical and social history Use of alcohol, tobacco or illicit drugs  Current medications and supplements including opioid prescriptions. Functional ability and status Nutritional status Physical activity Advanced directives List of other physicians Hospitalizations, surgeries, and ER visits in previous 12 months Vitals Screenings to include cognitive, depression, and falls Referrals and appointments  No orders of the defined types were placed in this encounter.  In addition, I have reviewed and discussed with patient certain preventive protocols, quality metrics, and best practice recommendations. A written personalized care plan for  preventive services as well as general preventive health recommendations were provided to patient.   Ardella FORBES Dawn, LPN   8/0/7973   Return in 1 year (on 01/16/2025).  After Visit Summary: (In Person-Printed) AVS printed and given to the patient  Nurse Notes: Vaccines not given: Will obtain shingles at pharmacy HM Addressed: Diabetic Foot Exam recommended  "

## 2024-03-11 ENCOUNTER — Ambulatory Visit: Admitting: Family Medicine

## 2025-01-18 ENCOUNTER — Ambulatory Visit
